# Patient Record
Sex: Male | Born: 1960 | Race: White | Hispanic: No | Marital: Married | Smoking: Former smoker
Health system: Southern US, Community
[De-identification: ages and names within clinical notes are randomized; demographics above are authoritative.]

## PROBLEM LIST (undated history)

## (undated) DIAGNOSIS — M1990 Unspecified osteoarthritis, unspecified site: Secondary | ICD-10-CM

## (undated) DIAGNOSIS — R7302 Impaired glucose tolerance (oral): Secondary | ICD-10-CM

## (undated) DIAGNOSIS — N529 Male erectile dysfunction, unspecified: Secondary | ICD-10-CM

## (undated) DIAGNOSIS — K219 Gastro-esophageal reflux disease without esophagitis: Secondary | ICD-10-CM

## (undated) DIAGNOSIS — G4730 Sleep apnea, unspecified: Secondary | ICD-10-CM

## (undated) DIAGNOSIS — E669 Obesity, unspecified: Secondary | ICD-10-CM

## (undated) DIAGNOSIS — C61 Malignant neoplasm of prostate: Secondary | ICD-10-CM

## (undated) DIAGNOSIS — E119 Type 2 diabetes mellitus without complications: Secondary | ICD-10-CM

## (undated) DIAGNOSIS — F419 Anxiety disorder, unspecified: Secondary | ICD-10-CM

## (undated) DIAGNOSIS — G4733 Obstructive sleep apnea (adult) (pediatric): Secondary | ICD-10-CM

## (undated) DIAGNOSIS — T7840XA Allergy, unspecified, initial encounter: Secondary | ICD-10-CM

## (undated) DIAGNOSIS — I2510 Atherosclerotic heart disease of native coronary artery without angina pectoris: Secondary | ICD-10-CM

## (undated) DIAGNOSIS — I10 Essential (primary) hypertension: Secondary | ICD-10-CM

## (undated) HISTORY — DX: Type 2 diabetes mellitus without complications: E11.9

## (undated) HISTORY — DX: Allergy, unspecified, initial encounter: T78.40XA

## (undated) HISTORY — PX: WRIST SURGERY: SHX841

## (undated) HISTORY — DX: Obstructive sleep apnea (adult) (pediatric): G47.33

## (undated) HISTORY — PX: NASAL SINUS SURGERY: SHX719

## (undated) HISTORY — PX: ELBOW SURGERY: SHX618

## (undated) HISTORY — DX: Obesity, unspecified: E66.9

## (undated) HISTORY — DX: Male erectile dysfunction, unspecified: N52.9

## (undated) HISTORY — PX: KNEE SURGERY: SHX244

## (undated) HISTORY — DX: Atherosclerotic heart disease of native coronary artery without angina pectoris: I25.10

## (undated) HISTORY — PX: SPERMATOCELECTOMY: SHX2420

## (undated) HISTORY — PX: CORONARY STENT PLACEMENT: SHX1402

## (undated) HISTORY — DX: Unspecified osteoarthritis, unspecified site: M19.90

## (undated) HISTORY — DX: Malignant neoplasm of prostate: C61

## (undated) HISTORY — DX: Sleep apnea, unspecified: G47.30

## (undated) HISTORY — DX: Anxiety disorder, unspecified: F41.9

## (undated) HISTORY — DX: Essential (primary) hypertension: I10

## (undated) HISTORY — DX: Impaired glucose tolerance (oral): R73.02

## (undated) HISTORY — DX: Gastro-esophageal reflux disease without esophagitis: K21.9

---

## 2000-06-18 ENCOUNTER — Emergency Department (HOSPITAL_COMMUNITY): Admission: EM | Admit: 2000-06-18 | Discharge: 2000-06-18 | Payer: Self-pay | Admitting: Emergency Medicine

## 2002-11-21 ENCOUNTER — Encounter: Payer: Self-pay | Admitting: Internal Medicine

## 2002-11-21 ENCOUNTER — Encounter: Admission: RE | Admit: 2002-11-21 | Discharge: 2002-11-21 | Payer: Self-pay | Admitting: Internal Medicine

## 2003-08-02 ENCOUNTER — Encounter: Payer: Self-pay | Admitting: *Deleted

## 2003-08-02 ENCOUNTER — Emergency Department (HOSPITAL_COMMUNITY): Admission: EM | Admit: 2003-08-02 | Discharge: 2003-08-02 | Payer: Self-pay | Admitting: Emergency Medicine

## 2005-01-13 ENCOUNTER — Ambulatory Visit: Payer: Self-pay | Admitting: Internal Medicine

## 2005-01-20 ENCOUNTER — Ambulatory Visit: Payer: Self-pay | Admitting: Internal Medicine

## 2005-02-18 ENCOUNTER — Ambulatory Visit: Payer: Self-pay | Admitting: Internal Medicine

## 2005-11-15 ENCOUNTER — Encounter: Admission: RE | Admit: 2005-11-15 | Discharge: 2005-11-15 | Payer: Self-pay | Admitting: Internal Medicine

## 2005-11-15 ENCOUNTER — Ambulatory Visit: Payer: Self-pay | Admitting: Internal Medicine

## 2006-01-18 ENCOUNTER — Ambulatory Visit: Payer: Self-pay | Admitting: Internal Medicine

## 2006-02-01 ENCOUNTER — Ambulatory Visit: Payer: Self-pay | Admitting: Internal Medicine

## 2006-03-17 ENCOUNTER — Ambulatory Visit: Payer: Self-pay | Admitting: Internal Medicine

## 2006-03-18 ENCOUNTER — Ambulatory Visit: Payer: Self-pay | Admitting: Cardiovascular Disease

## 2006-04-07 ENCOUNTER — Ambulatory Visit: Payer: Self-pay | Admitting: Internal Medicine

## 2006-07-28 ENCOUNTER — Ambulatory Visit: Payer: Self-pay | Admitting: Internal Medicine

## 2006-10-24 ENCOUNTER — Ambulatory Visit: Payer: Self-pay | Admitting: Internal Medicine

## 2006-10-31 ENCOUNTER — Ambulatory Visit: Payer: Self-pay | Admitting: Internal Medicine

## 2006-10-31 LAB — CONVERTED CEMR LAB
ALT: 32 units/L (ref 0–40)
AST: 19 units/L (ref 0–37)
Albumin: 4.1 g/dL (ref 3.5–5.2)
Alkaline Phosphatase: 63 units/L (ref 39–117)
BUN: 18 mg/dL (ref 6–23)
Basophils Absolute: 0.1 10*3/uL (ref 0.0–0.1)
Basophils Relative: 1.4 % — ABNORMAL HIGH (ref 0.0–1.0)
Bilirubin Urine: NEGATIVE
CO2: 34 meq/L — ABNORMAL HIGH (ref 19–32)
Calcium: 9.6 mg/dL (ref 8.4–10.5)
Chloride: 101 meq/L (ref 96–112)
Chol/HDL Ratio, serum: 4.3
Cholesterol: 211 mg/dL (ref 0–200)
Creatinine, Ser: 1.5 mg/dL (ref 0.4–1.5)
Eosinophil percent: 2.5 % (ref 0.0–5.0)
GFR calc non Af Amer: 54 mL/min
Glomerular Filtration Rate, Af Am: 65 mL/min/{1.73_m2}
Glucose, Bld: 94 mg/dL (ref 70–99)
HCT: 50.7 % (ref 39.0–52.0)
HDL: 48.9 mg/dL (ref >39.0)
Hemoglobin, Urine: NEGATIVE
Hemoglobin: 16.9 g/dL (ref 13.0–17.0)
Ketones, ur: NEGATIVE mg/dL
LDL DIRECT: 130.6 mg/dL
Leukocytes, UA: NEGATIVE
Lymphocytes Relative: 31.5 % (ref 12.0–46.0)
MCHC: 33.3 g/dL (ref 30.0–36.0)
MCV: 87.3 fL (ref 78.0–100.0)
Monocytes Absolute: 0.7 10*3/uL (ref 0.2–0.7)
Monocytes Relative: 6.3 % (ref 3.0–11.0)
Neutro Abs: 6.2 10*3/uL (ref 1.4–7.7)
Neutrophils Relative %: 58.3 % (ref 43.0–77.0)
Nitrite: NEGATIVE
PSA: 5.57 ng/mL — ABNORMAL HIGH (ref 0.10–4.00)
Platelets: 396 10*3/uL (ref 150–400)
Potassium: 3.9 meq/L (ref 3.5–5.1)
RBC: 5.81 M/uL (ref 4.22–5.81)
RDW: 12.5 % (ref 11.5–14.6)
Sodium: 142 meq/L (ref 135–145)
Specific Gravity, Urine: 1.025 (ref 1.000–1.03)
TSH: 3.19 microintl units/mL (ref 0.35–5.50)
Total Bilirubin: 1 mg/dL (ref 0.3–1.2)
Total Protein, Urine: NEGATIVE mg/dL
Total Protein: 7 g/dL (ref 6.0–8.3)
Triglyceride fasting, serum: 241 mg/dL (ref 0–149)
Urine Glucose: NEGATIVE mg/dL
Urobilinogen, UA: 0.2 (ref 0.0–1.0)
VLDL: 48 mg/dL — ABNORMAL HIGH (ref 0–40)
WBC: 10.6 10*3/uL — ABNORMAL HIGH (ref 4.5–10.5)
pH: 6 (ref 5.0–8.0)

## 2006-12-07 ENCOUNTER — Ambulatory Visit: Payer: Self-pay | Admitting: Internal Medicine

## 2007-01-02 ENCOUNTER — Ambulatory Visit: Payer: Self-pay | Admitting: Internal Medicine

## 2007-02-16 ENCOUNTER — Inpatient Hospital Stay (HOSPITAL_COMMUNITY): Admission: RE | Admit: 2007-02-16 | Discharge: 2007-02-17 | Payer: Self-pay | Admitting: Urology

## 2007-02-16 ENCOUNTER — Encounter (INDEPENDENT_AMBULATORY_CARE_PROVIDER_SITE_OTHER): Payer: Self-pay | Admitting: Specialist

## 2007-09-01 ENCOUNTER — Ambulatory Visit: Payer: Self-pay | Admitting: Internal Medicine

## 2007-09-01 LAB — CONVERTED CEMR LAB
ALT: 53 units/L (ref 0–53)
AST: 30 units/L (ref 0–37)
Albumin: 4.1 g/dL (ref 3.5–5.2)
Alkaline Phosphatase: 62 units/L (ref 39–117)
BUN: 13 mg/dL (ref 6–23)
Basophils Absolute: 0.1 10*3/uL (ref 0.0–0.1)
Basophils Relative: 1.2 % — ABNORMAL HIGH (ref 0.0–1.0)
Bilirubin Urine: NEGATIVE
Bilirubin, Direct: 0.1 mg/dL (ref 0.0–0.3)
CO2: 32 meq/L (ref 19–32)
Calcium: 9.5 mg/dL (ref 8.4–10.5)
Chloride: 109 meq/L (ref 96–112)
Cholesterol: 197 mg/dL (ref 0–200)
Creatinine, Ser: 1.3 mg/dL (ref 0.4–1.5)
Direct LDL: 107.4 mg/dL
Eosinophils Absolute: 0.4 10*3/uL (ref 0.0–0.6)
Eosinophils Relative: 5.9 % — ABNORMAL HIGH (ref 0.0–5.0)
GFR calc Af Amer: 76 mL/min
GFR calc non Af Amer: 63 mL/min
Glucose, Bld: 113 mg/dL — ABNORMAL HIGH (ref 70–99)
HCT: 45.2 % (ref 39.0–52.0)
HDL: 38.7 mg/dL — ABNORMAL LOW (ref >39.0)
Hemoglobin, Urine: NEGATIVE
Hemoglobin: 15.9 g/dL (ref 13.0–17.0)
Ketones, ur: NEGATIVE mg/dL
Leukocytes, UA: NEGATIVE
Lymphocytes Relative: 21.4 % (ref 12.0–46.0)
MCHC: 35.2 g/dL (ref 30.0–36.0)
MCV: 86.4 fL (ref 78.0–100.0)
Monocytes Absolute: 0.5 10*3/uL (ref 0.2–0.7)
Monocytes Relative: 7.7 % (ref 3.0–11.0)
Neutro Abs: 4.5 10*3/uL (ref 1.4–7.7)
Neutrophils Relative %: 63.8 % (ref 43.0–77.0)
Nitrite: NEGATIVE
PSA: 0 ng/mL — ABNORMAL LOW (ref 0.10–4.00)
Platelets: 308 10*3/uL (ref 150–400)
Potassium: 3.8 meq/L (ref 3.5–5.1)
RBC: 5.24 M/uL (ref 4.22–5.81)
RDW: 12.5 % (ref 11.5–14.6)
Sodium: 146 meq/L — ABNORMAL HIGH (ref 135–145)
Specific Gravity, Urine: 1.025 (ref 1.000–1.03)
TSH: 2.71 microintl units/mL (ref 0.35–5.50)
Total Bilirubin: 0.7 mg/dL (ref 0.3–1.2)
Total CHOL/HDL Ratio: 5.1
Total Protein, Urine: NEGATIVE mg/dL
Total Protein: 6.6 g/dL (ref 6.0–8.3)
Triglycerides: 362 mg/dL (ref 0–149)
Urine Glucose: NEGATIVE mg/dL
Urobilinogen, UA: 0.2 (ref 0.0–1.0)
VLDL: 72 mg/dL — ABNORMAL HIGH (ref 0–40)
WBC: 7 10*3/uL (ref 4.5–10.5)
pH: 6 (ref 5.0–8.0)

## 2007-09-04 ENCOUNTER — Ambulatory Visit: Payer: Self-pay | Admitting: Internal Medicine

## 2007-09-05 ENCOUNTER — Encounter: Payer: Self-pay | Admitting: Internal Medicine

## 2007-09-05 DIAGNOSIS — J45909 Unspecified asthma, uncomplicated: Secondary | ICD-10-CM | POA: Insufficient documentation

## 2007-09-05 DIAGNOSIS — Z8546 Personal history of malignant neoplasm of prostate: Secondary | ICD-10-CM | POA: Insufficient documentation

## 2007-09-05 DIAGNOSIS — E739 Lactose intolerance, unspecified: Secondary | ICD-10-CM

## 2007-09-05 DIAGNOSIS — K219 Gastro-esophageal reflux disease without esophagitis: Secondary | ICD-10-CM | POA: Insufficient documentation

## 2007-09-05 DIAGNOSIS — R030 Elevated blood-pressure reading, without diagnosis of hypertension: Secondary | ICD-10-CM | POA: Insufficient documentation

## 2007-10-19 ENCOUNTER — Encounter: Payer: Self-pay | Admitting: Internal Medicine

## 2008-01-19 ENCOUNTER — Encounter: Payer: Self-pay | Admitting: Internal Medicine

## 2008-08-13 ENCOUNTER — Encounter: Payer: Self-pay | Admitting: Internal Medicine

## 2008-09-11 ENCOUNTER — Encounter: Payer: Self-pay | Admitting: Internal Medicine

## 2008-10-10 ENCOUNTER — Ambulatory Visit: Payer: Self-pay | Admitting: Internal Medicine

## 2008-10-10 LAB — CONVERTED CEMR LAB
ALT: 41 units/L (ref 0–53)
AST: 28 units/L (ref 0–37)
Albumin: 4.1 g/dL (ref 3.5–5.2)
Alkaline Phosphatase: 57 units/L (ref 39–117)
BUN: 20 mg/dL (ref 6–23)
Basophils Absolute: 0 10*3/uL (ref 0.0–0.1)
Basophils Relative: 0.3 % (ref 0.0–3.0)
Bilirubin Urine: NEGATIVE
Bilirubin, Direct: 0.2 mg/dL (ref 0.0–0.3)
CO2: 32 meq/L (ref 19–32)
Calcium: 9.5 mg/dL (ref 8.4–10.5)
Chloride: 98 meq/L (ref 96–112)
Cholesterol: 182 mg/dL (ref 0–200)
Creatinine, Ser: 1.4 mg/dL (ref 0.4–1.5)
Eosinophils Absolute: 0.3 10*3/uL (ref 0.0–0.7)
Eosinophils Relative: 4.9 % (ref 0.0–5.0)
GFR calc Af Amer: 70 mL/min
GFR calc non Af Amer: 58 mL/min
Glucose, Bld: 101 mg/dL — ABNORMAL HIGH (ref 70–99)
HCT: 50.3 % (ref 39.0–52.0)
HDL: 39.5 mg/dL (ref >39.0)
Hemoglobin, Urine: NEGATIVE
Hemoglobin: 17.2 g/dL — ABNORMAL HIGH (ref 13.0–17.0)
Ketones, ur: NEGATIVE mg/dL
LDL Cholesterol: 115 mg/dL — ABNORMAL HIGH (ref 0–99)
Leukocytes, UA: NEGATIVE
Lymphocytes Relative: 20.6 % (ref 12.0–46.0)
MCHC: 34.1 g/dL (ref 30.0–36.0)
MCV: 87.5 fL (ref 78.0–100.0)
Monocytes Absolute: 0.6 10*3/uL (ref 0.1–1.0)
Monocytes Relative: 10.7 % (ref 3.0–12.0)
Neutro Abs: 3.8 10*3/uL (ref 1.4–7.7)
Neutrophils Relative %: 63.5 % (ref 43.0–77.0)
Nitrite: NEGATIVE
PSA: 0 ng/mL — ABNORMAL LOW (ref 0.10–4.00)
Platelets: 261 10*3/uL (ref 150–400)
Potassium: 4.2 meq/L (ref 3.5–5.1)
RBC: 5.74 M/uL (ref 4.22–5.81)
RDW: 12.5 % (ref 11.5–14.6)
Sodium: 138 meq/L (ref 135–145)
Specific Gravity, Urine: 1.02 (ref 1.000–1.03)
TSH: 3.41 microintl units/mL (ref 0.35–5.50)
Total Bilirubin: 1 mg/dL (ref 0.3–1.2)
Total CHOL/HDL Ratio: 4.6
Total Protein, Urine: NEGATIVE mg/dL
Total Protein: 7.3 g/dL (ref 6.0–8.3)
Triglycerides: 136 mg/dL (ref 0–149)
Urine Glucose: NEGATIVE mg/dL
Urobilinogen, UA: 0.2 (ref 0.0–1.0)
VLDL: 27 mg/dL (ref 0–40)
WBC: 5.9 10*3/uL (ref 4.5–10.5)
pH: 6 (ref 5.0–8.0)

## 2008-10-15 ENCOUNTER — Ambulatory Visit: Payer: Self-pay | Admitting: Internal Medicine

## 2008-10-15 DIAGNOSIS — F411 Generalized anxiety disorder: Secondary | ICD-10-CM | POA: Insufficient documentation

## 2008-10-15 DIAGNOSIS — J309 Allergic rhinitis, unspecified: Secondary | ICD-10-CM | POA: Insufficient documentation

## 2008-10-16 ENCOUNTER — Encounter: Payer: Self-pay | Admitting: Internal Medicine

## 2008-12-03 ENCOUNTER — Encounter: Payer: Self-pay | Admitting: Internal Medicine

## 2008-12-16 ENCOUNTER — Ambulatory Visit: Payer: Self-pay | Admitting: Internal Medicine

## 2008-12-16 DIAGNOSIS — R1084 Generalized abdominal pain: Secondary | ICD-10-CM | POA: Insufficient documentation

## 2008-12-16 DIAGNOSIS — R109 Unspecified abdominal pain: Secondary | ICD-10-CM | POA: Insufficient documentation

## 2008-12-16 LAB — CONVERTED CEMR LAB
BUN: 15 mg/dL (ref 6–23)
CO2: 31 meq/L (ref 19–32)
Calcium: 8.9 mg/dL (ref 8.4–10.5)
Chloride: 105 meq/L (ref 96–112)
Creatinine, Ser: 1.2 mg/dL (ref 0.4–1.5)
GFR calc Af Amer: 83 mL/min
GFR calc non Af Amer: 69 mL/min
Glucose, Bld: 98 mg/dL (ref 70–99)
H Pylori IgG: NEGATIVE
Potassium: 4.1 meq/L (ref 3.5–5.1)
Sodium: 141 meq/L (ref 135–145)

## 2008-12-18 ENCOUNTER — Ambulatory Visit: Payer: Self-pay | Admitting: Internal Medicine

## 2008-12-24 ENCOUNTER — Encounter: Payer: Self-pay | Admitting: Internal Medicine

## 2009-01-03 ENCOUNTER — Ambulatory Visit (HOSPITAL_BASED_OUTPATIENT_CLINIC_OR_DEPARTMENT_OTHER): Admission: RE | Admit: 2009-01-03 | Discharge: 2009-01-03 | Payer: Self-pay | Admitting: Orthopedic Surgery

## 2009-01-16 ENCOUNTER — Encounter: Payer: Self-pay | Admitting: Internal Medicine

## 2009-01-20 ENCOUNTER — Encounter: Payer: Self-pay | Admitting: Internal Medicine

## 2009-02-11 ENCOUNTER — Encounter: Payer: Self-pay | Admitting: Internal Medicine

## 2009-03-13 ENCOUNTER — Encounter: Payer: Self-pay | Admitting: Internal Medicine

## 2009-04-10 ENCOUNTER — Encounter: Payer: Self-pay | Admitting: Internal Medicine

## 2009-05-22 ENCOUNTER — Encounter: Payer: Self-pay | Admitting: Internal Medicine

## 2009-08-01 ENCOUNTER — Encounter: Payer: Self-pay | Admitting: Internal Medicine

## 2009-10-13 ENCOUNTER — Ambulatory Visit: Payer: Self-pay | Admitting: Internal Medicine

## 2009-10-13 LAB — CONVERTED CEMR LAB
ALT: 49 units/L (ref 0–53)
AST: 32 units/L (ref 0–37)
Albumin: 4.3 g/dL (ref 3.5–5.2)
Alkaline Phosphatase: 69 units/L (ref 39–117)
BUN: 15 mg/dL (ref 6–23)
Basophils Absolute: 0 10*3/uL (ref 0.0–0.1)
Basophils Relative: 0.8 % (ref 0.0–3.0)
Bilirubin Urine: NEGATIVE
Bilirubin, Direct: 0.1 mg/dL (ref 0.0–0.3)
CO2: 31 meq/L (ref 19–32)
Calcium: 9.6 mg/dL (ref 8.4–10.5)
Chloride: 106 meq/L (ref 96–112)
Cholesterol: 172 mg/dL (ref 0–200)
Creatinine, Ser: 1.3 mg/dL (ref 0.4–1.5)
Eosinophils Absolute: 0.5 10*3/uL (ref 0.0–0.7)
Eosinophils Relative: 8.7 % — ABNORMAL HIGH (ref 0.0–5.0)
GFR calc non Af Amer: 62.47 mL/min (ref >60)
Glucose, Bld: 101 mg/dL — ABNORMAL HIGH (ref 70–99)
HCT: 48.5 % (ref 39.0–52.0)
HDL: 42.1 mg/dL (ref >39.00)
Hemoglobin, Urine: NEGATIVE
Hemoglobin: 16.6 g/dL (ref 13.0–17.0)
Ketones, ur: NEGATIVE mg/dL
LDL Cholesterol: 101 mg/dL — ABNORMAL HIGH (ref 0–99)
Leukocytes, UA: NEGATIVE
Lymphocytes Relative: 28.3 % (ref 12.0–46.0)
Lymphs Abs: 1.6 10*3/uL (ref 0.7–4.0)
MCHC: 34.2 g/dL (ref 30.0–36.0)
MCV: 90.4 fL (ref 78.0–100.0)
Monocytes Absolute: 0.4 10*3/uL (ref 0.1–1.0)
Monocytes Relative: 6.6 % (ref 3.0–12.0)
Neutro Abs: 3.2 10*3/uL (ref 1.4–7.7)
Neutrophils Relative %: 55.6 % (ref 43.0–77.0)
Nitrite: NEGATIVE
PSA: 0.01 ng/mL — ABNORMAL LOW (ref 0.10–4.00)
Platelets: 312 10*3/uL (ref 150.0–400.0)
Potassium: 4.4 meq/L (ref 3.5–5.1)
RBC: 5.37 M/uL (ref 4.22–5.81)
RDW: 12.1 % (ref 11.5–14.6)
Sodium: 145 meq/L (ref 135–145)
Specific Gravity, Urine: 1.025 (ref 1.000–1.030)
TSH: 2.61 microintl units/mL (ref 0.35–5.50)
Total Bilirubin: 0.8 mg/dL (ref 0.3–1.2)
Total CHOL/HDL Ratio: 4
Total Protein, Urine: NEGATIVE mg/dL
Total Protein: 6.7 g/dL (ref 6.0–8.3)
Triglycerides: 144 mg/dL (ref 0.0–149.0)
Urine Glucose: NEGATIVE mg/dL
Urobilinogen, UA: 0.2 (ref 0.0–1.0)
VLDL: 28.8 mg/dL (ref 0.0–40.0)
WBC: 5.7 10*3/uL (ref 4.5–10.5)
pH: 5.5 (ref 5.0–8.0)

## 2009-10-17 ENCOUNTER — Ambulatory Visit: Payer: Self-pay | Admitting: Internal Medicine

## 2009-11-21 ENCOUNTER — Ambulatory Visit: Payer: Self-pay | Admitting: Internal Medicine

## 2009-11-21 DIAGNOSIS — M549 Dorsalgia, unspecified: Secondary | ICD-10-CM | POA: Insufficient documentation

## 2009-12-20 DIAGNOSIS — I219 Acute myocardial infarction, unspecified: Secondary | ICD-10-CM

## 2009-12-20 HISTORY — DX: Acute myocardial infarction, unspecified: I21.9

## 2009-12-20 HISTORY — PX: CORONARY STENT PLACEMENT: SHX1402

## 2010-01-10 ENCOUNTER — Emergency Department (HOSPITAL_COMMUNITY): Admission: EM | Admit: 2010-01-10 | Discharge: 2010-01-10 | Payer: Self-pay | Admitting: Emergency Medicine

## 2010-02-06 ENCOUNTER — Encounter: Payer: Self-pay | Admitting: Internal Medicine

## 2010-03-13 ENCOUNTER — Ambulatory Visit: Payer: Self-pay | Admitting: Internal Medicine

## 2010-03-13 DIAGNOSIS — F459 Somatoform disorder, unspecified: Secondary | ICD-10-CM | POA: Insufficient documentation

## 2010-03-13 DIAGNOSIS — R42 Dizziness and giddiness: Secondary | ICD-10-CM | POA: Insufficient documentation

## 2010-03-16 ENCOUNTER — Telehealth: Payer: Self-pay | Admitting: Internal Medicine

## 2010-03-20 ENCOUNTER — Ambulatory Visit (HOSPITAL_COMMUNITY)
Admission: RE | Admit: 2010-03-20 | Discharge: 2010-03-20 | Payer: Self-pay | Source: Home / Self Care | Admitting: Internal Medicine

## 2010-03-23 ENCOUNTER — Encounter: Payer: Self-pay | Admitting: Internal Medicine

## 2010-03-24 ENCOUNTER — Telehealth: Payer: Self-pay | Admitting: Internal Medicine

## 2010-03-31 ENCOUNTER — Encounter (INDEPENDENT_AMBULATORY_CARE_PROVIDER_SITE_OTHER): Payer: Self-pay | Admitting: *Deleted

## 2010-04-21 ENCOUNTER — Encounter: Payer: Self-pay | Admitting: Internal Medicine

## 2010-09-17 ENCOUNTER — Ambulatory Visit: Payer: Self-pay | Admitting: Internal Medicine

## 2010-09-17 LAB — CONVERTED CEMR LAB
ALT: 25 units/L (ref 0–53)
AST: 23 units/L (ref 0–37)
Albumin: 4.5 g/dL (ref 3.5–5.2)
Alkaline Phosphatase: 67 units/L (ref 39–117)
BUN: 16 mg/dL (ref 6–23)
Basophils Absolute: 0.1 10*3/uL (ref 0.0–0.1)
Basophils Relative: 0.9 % (ref 0.0–3.0)
Bilirubin Urine: NEGATIVE
Bilirubin, Direct: 0.1 mg/dL (ref 0.0–0.3)
CO2: 32 meq/L (ref 19–32)
Calcium: 10.2 mg/dL (ref 8.4–10.5)
Chloride: 106 meq/L (ref 96–112)
Cholesterol: 178 mg/dL (ref 0–200)
Creatinine, Ser: 1.3 mg/dL (ref 0.4–1.5)
Eosinophils Absolute: 0.7 10*3/uL (ref 0.0–0.7)
Eosinophils Relative: 10.7 % — ABNORMAL HIGH (ref 0.0–5.0)
GFR calc non Af Amer: 62.78 mL/min (ref >60)
Glucose, Bld: 98 mg/dL (ref 70–99)
HCT: 48.5 % (ref 39.0–52.0)
HDL: 44.9 mg/dL (ref >39.00)
Hemoglobin, Urine: NEGATIVE
Hemoglobin: 16.8 g/dL (ref 13.0–17.0)
Ketones, ur: NEGATIVE mg/dL
LDL Cholesterol: 104 mg/dL — ABNORMAL HIGH (ref 0–99)
Leukocytes, UA: NEGATIVE
Lymphocytes Relative: 28.2 % (ref 12.0–46.0)
Lymphs Abs: 1.7 10*3/uL (ref 0.7–4.0)
MCHC: 34.6 g/dL (ref 30.0–36.0)
MCV: 88.5 fL (ref 78.0–100.0)
Monocytes Absolute: 0.4 10*3/uL (ref 0.1–1.0)
Monocytes Relative: 7.1 % (ref 3.0–12.0)
Neutro Abs: 3.3 10*3/uL (ref 1.4–7.7)
Neutrophils Relative %: 53.1 % (ref 43.0–77.0)
Nitrite: NEGATIVE
PSA: 0.01 ng/mL — ABNORMAL LOW (ref 0.10–4.00)
Platelets: 305 10*3/uL (ref 150.0–400.0)
Potassium: 5.7 meq/L — ABNORMAL HIGH (ref 3.5–5.1)
RBC: 5.48 M/uL (ref 4.22–5.81)
RDW: 13.2 % (ref 11.5–14.6)
Sodium: 143 meq/L (ref 135–145)
Specific Gravity, Urine: 1.025 (ref 1.000–1.030)
TSH: 2.69 microintl units/mL (ref 0.35–5.50)
Total Bilirubin: 0.7 mg/dL (ref 0.3–1.2)
Total CHOL/HDL Ratio: 4
Total Protein, Urine: NEGATIVE mg/dL
Total Protein: 7 g/dL (ref 6.0–8.3)
Triglycerides: 144 mg/dL (ref 0.0–149.0)
Urine Glucose: NEGATIVE mg/dL
Urobilinogen, UA: 0.2 (ref 0.0–1.0)
VLDL: 28.8 mg/dL (ref 0.0–40.0)
WBC: 6.2 10*3/uL (ref 4.5–10.5)
pH: 6 (ref 5.0–8.0)

## 2010-09-18 ENCOUNTER — Encounter: Payer: Self-pay | Admitting: Internal Medicine

## 2010-09-18 ENCOUNTER — Ambulatory Visit: Payer: Self-pay | Admitting: Internal Medicine

## 2010-12-04 ENCOUNTER — Inpatient Hospital Stay (HOSPITAL_COMMUNITY)
Admission: RE | Admit: 2010-12-04 | Discharge: 2010-12-07 | Payer: Self-pay | Source: Home / Self Care | Attending: Cardiovascular Disease | Admitting: Cardiovascular Disease

## 2010-12-04 ENCOUNTER — Encounter: Payer: Self-pay | Admitting: Physician Assistant

## 2010-12-06 ENCOUNTER — Encounter: Payer: Self-pay | Admitting: Cardiology

## 2010-12-09 ENCOUNTER — Telehealth: Payer: Self-pay | Admitting: Cardiovascular Disease

## 2010-12-10 ENCOUNTER — Telehealth (INDEPENDENT_AMBULATORY_CARE_PROVIDER_SITE_OTHER): Payer: Self-pay | Admitting: *Deleted

## 2010-12-17 ENCOUNTER — Encounter: Payer: Self-pay | Admitting: Physician Assistant

## 2010-12-22 ENCOUNTER — Inpatient Hospital Stay (HOSPITAL_COMMUNITY)
Admission: EM | Admit: 2010-12-22 | Discharge: 2010-12-23 | Payer: Self-pay | Source: Home / Self Care | Attending: Cardiology | Admitting: Cardiology

## 2010-12-22 ENCOUNTER — Ambulatory Visit
Admission: RE | Admit: 2010-12-22 | Discharge: 2010-12-22 | Payer: Self-pay | Source: Home / Self Care | Attending: Physician Assistant | Admitting: Physician Assistant

## 2010-12-22 ENCOUNTER — Encounter: Payer: Self-pay | Admitting: Physician Assistant

## 2010-12-22 ENCOUNTER — Encounter (INDEPENDENT_AMBULATORY_CARE_PROVIDER_SITE_OTHER): Payer: Self-pay | Admitting: *Deleted

## 2010-12-22 ENCOUNTER — Encounter: Payer: Self-pay | Admitting: Cardiovascular Disease

## 2010-12-22 DIAGNOSIS — I2510 Atherosclerotic heart disease of native coronary artery without angina pectoris: Secondary | ICD-10-CM | POA: Insufficient documentation

## 2010-12-22 DIAGNOSIS — R079 Chest pain, unspecified: Secondary | ICD-10-CM | POA: Insufficient documentation

## 2010-12-22 DIAGNOSIS — I2119 ST elevation (STEMI) myocardial infarction involving other coronary artery of inferior wall: Secondary | ICD-10-CM | POA: Insufficient documentation

## 2010-12-22 DIAGNOSIS — E785 Hyperlipidemia, unspecified: Secondary | ICD-10-CM | POA: Insufficient documentation

## 2010-12-23 LAB — CARDIAC PANEL(CRET KIN+CKTOT+MB+TROPI)
CK, MB: 1 ng/mL (ref 0.3–4.0)
Relative Index: INVALID (ref 0.0–2.5)
Total CK: 56 U/L (ref 7–232)
Troponin I: 0.02 ng/mL (ref 0.00–0.06)

## 2010-12-24 ENCOUNTER — Encounter: Payer: Self-pay | Admitting: Cardiovascular Disease

## 2011-01-01 ENCOUNTER — Telehealth: Payer: Self-pay | Admitting: Cardiovascular Disease

## 2011-01-13 ENCOUNTER — Telehealth: Payer: Self-pay | Admitting: Cardiovascular Disease

## 2011-01-15 ENCOUNTER — Encounter: Payer: Self-pay | Admitting: Cardiovascular Disease

## 2011-01-15 ENCOUNTER — Ambulatory Visit
Admission: RE | Admit: 2011-01-15 | Discharge: 2011-01-15 | Payer: Self-pay | Source: Home / Self Care | Attending: Cardiovascular Disease | Admitting: Cardiovascular Disease

## 2011-01-19 ENCOUNTER — Ambulatory Visit
Admission: RE | Admit: 2011-01-19 | Discharge: 2011-01-19 | Payer: Self-pay | Source: Home / Self Care | Attending: Internal Medicine | Admitting: Internal Medicine

## 2011-01-19 DIAGNOSIS — G4733 Obstructive sleep apnea (adult) (pediatric): Secondary | ICD-10-CM | POA: Insufficient documentation

## 2011-01-19 NOTE — Discharge Summary (Signed)
NAMEFRED, FRANZEN                  ACCOUNT NO.:  [id number]  MEDICAL RECORD NO.:  [id number]          PATIENT TYPE:  INP  LOCATION:  4705                         FACILITY:  [facility]  PHYSICIAN:  Veverly Fells. Excell Seltzer, MD  DATE OF BIRTH:  July 01, 1961  DATE OF ADMISSION:  12/22/2010 DATE OF DISCHARGE:  12/23/2010                              DISCHARGE SUMMARY   PRIMARY CARDIOLOGIST:  Veverly Fells. Excell Seltzer, MD  PRIMARY CARE PROVIDER:  Corwin Levins, MD  DISCHARGE DIAGNOSIS:  Chest pain without objective evidence of ischemia.  SECONDARY DIAGNOSES: 1. Coronary artery disease, status post prior inferior ST-elevation     myocardial infarction with bare-metal stenting x2 of the right     coronary artery, December 2011. 2. History of mild hypotension in the setting of myocardial     infarction. 3. Remote tobacco abuse, quitting 20 years ago. 4. History of prostate cancer, status post prostatectomy in 2008. 5. Gastroesophageal reflux disease. 6. Erectile dysfunction. 7. Osteoarthritis. 8. Anxiety.  ALLERGIES:  No known drug allergies.  PROCEDURES:  None.  HISTORY OF PRESENT ILLNESS:  A 50 year old male status post recent inferior ST-elevation MI on December 04, 2010, with bare-metal stenting of the right coronary artery.  The patient did have recurrent pain and ST-segment elevation immediately following this procedure with acute stent thrombosis, requiring additional bare-metal stent.  The patient followed up in office on December 22, 2010 and reported mild persistent chest discomfort dissimilar to prior angina.  He also reported occasional arm pain.  While in the office, he became acutely lightheaded and left arm pain worsened.  ECG was performed showed no acute changes. Decision was made to observe the patient in the hospital overnight.  HOSPITAL COURSE:  The patient ruled out for MI.  This morning, he denies chest discomfort, but does report occasional palpitations.  We have been able to  initiate low-dosed beta-blocker therapy, which we were unable to do when he was here for his MI secondary to hypotension.  After long discussion with the patient and wife, it was felt that perhaps post MI anxiety is driving symptoms at this point.  The patient has been reassured and will be discharged home today in good condition.  DISCHARGE LABS:  Hemoglobin 15.0, hematocrit 44.0, WBC 7.8, platelets 373.  INR 1.0.  Sodium 141, potassium 3.7, chloride 102, CO2 29, BUN 6, creatinine 1.1, glucose 102, total bilirubin 0.5, alkaline phosphatase 86, AST 30, ALT 33, total protein 6.7, albumin 4.1, calcium 9.3, CK 56, MB 1.0, troponin-I 0.02.  DISPOSITION:  The patient will be discharged home today in good condition.  FOLLOWUP PLANS AND APPOINTMENTS:  We have arranged for the patient to follow up with Dr. Excell Seltzer on January 22, 2011 at 11 a.m., follow up with Dr. Jonny Ruiz as scheduled.  The patient plans to enroll in cardiac rehab in [place].  DISCHARGE MEDICATIONS: 1. Pepcid 20 mg b.i.d. 2. Toprol-XL 25 mg half a tablet daily. 3. Aspirin 81 mg daily. 4. Alprazolam 0.5 mg t.i.d. p.r.n. 5. Lipitor 80 mg nightly. 6. Nitroglycerin 0.4 mg sublingual p.r.n. chest pain. 7. Prasugrel 10 mg daily. 8. Ultram  50 mg q.6 h. p.r.n.  OUTSTANDING LABORATORY STUDIES:  The patient will require a followup lipids and LFTs in approximately 4 weeks as statin was new at the time of his MI.  DURATION OF DISCHARGE ENCOUNTER:  Thirty five minutes including physician time.     Nicolasa Ducking, ANP   ______________________________ Veverly Fells. Excell Seltzer, MD    CB/MEDQ  D:  12/23/2010  T:  12/24/2010  Job:  [id number]  cc:   Corwin Levins, MD  Electronically Signed by Nicolasa Ducking ANP on 01/11/2011 03:43:20 PM Electronically Signed by Tonny Bollman MD on 01/19/2011 04:46:41 AM

## 2011-01-21 NOTE — Letter (Signed)
Summary: [facility] Consult Scheduled Letter  [facility]  [address] [place], [place] [address]   Phone: [phone]  Fax: [phone]      03/31/2010 MRN: [id number]  [facility] [address] [place], [place]  [address]    Dear Mr. Andrus,      We have scheduled an appointment for you.  At the recommendation of Dr.John, we have scheduled you a consult with Dr Terrace Arabia on 04/21/10 at 10:15am.  Their phone number is [phone].  If this appointment day and time is not convenient for you, please feel free to call the office of the doctor you are being referred to at the number listed above and reschedule the appointment.    [facility] [address] [place], [place] [address]   *Please arrive 30 minutes prior to appointment time.*   Thank you,  Patient Care Coordinator  [facility]

## 2011-01-21 NOTE — Progress Notes (Signed)
   FMLA papers dropped off,sent to [facility]  December 10, 2010 2:57 PM

## 2011-01-21 NOTE — Progress Notes (Signed)
Summary: MRI?  Phone Note Call from Patient   Caller: Patient [phone] Summary of Call: pt called wanting to know if an ABX is needed to treat chronic sinusitis and if he would need to be referred to Neurologist for Atrophy per MRI. please advise Initial call taken by: Margaret Pyle, CMA,  March 24, 2010 9:29 AM  Follow-up for Phone Call        he is already on the tx for chronic sinusitis with the allegra, although we could refer to allergy if he wants;    there is no specific reason to see neurology for the atrophy;  I would consider psychiatry referral though based on his symptoms adn we could refer if he wants to do this Follow-up by: Corwin Levins MD,  March 24, 2010 12:48 PM  Additional Follow-up for Phone Call Additional follow up Details #1::        Pt's spouse informed and would like for pt to have referral to Psych. Pt's spouse informed to expect call from [facility]. Additional Follow-up by: Margaret Pyle, CMA,  March 24, 2010 2:39 PM    Additional Follow-up for Phone Call Additional follow up Details #2::    ok for referral, done per EMR Follow-up by: Corwin Levins MD,  March 24, 2010 3:17 PM

## 2011-01-21 NOTE — Letter (Signed)
Summary: [facility]  [facility]   Imported By: Sherian Rein 02/13/2010 10:07:15  _____________________________________________________________________  External Attachment:    Type:   Image     Comment:   External Document

## 2011-01-21 NOTE — Assessment & Plan Note (Signed)
Summary: look at rash/? med related  Medications Added BRILINTA 90 MG TABS (TICAGRELOR) take one tablet by mouth two times a day ASPIRIN 81 MG TBEC (ASPIRIN) Take one tablet by mouth daily NITROSTAT 0.4 MG SUBL (NITROGLYCERIN) 1 tablet under tongue at onset of chest pain; you may repeat every 5 minutes for up to 3 doses.        Visit Type:  Follow-up Primary Provider:  Corwin Levins MD  CC:  Rash.  History of Present Illness: [name] Gross is a 50 yo male who was admitted to [facility] on 12/04/2010 with an acute inferior STEMI.  He was treated with a BMS to the RCA which was occluded in the mid vessel.  Post intervention he developed recurrent chest pain with ST elevation.  He underwent emergent relook cath which demonstrated acute stent thrombosis and he was treated with a second BMS with adjunctive thrombectomy.  EF was low at 45% with inferior dyskinesis on cath.  Echo 12/06/2010 demonstrated an EF 55-65%; ? inferior HK and mild MR.   The patient has developed a rash on his arms and legs associated with intense itching. He has noted this over the past 3 weeks. No involvement of the trunk and denies any unusual skin exposures.  He is having problems with anxiety and panic attacks. He notes situational tachycardia. At times he has fleeting chest pain and he them will check his HR and it will be elevated in the range of 120-130 bpm.  No exertional chest pain or pressure and he is doing fine with cardiac rehab. Returns to work next week.  Current Medications (verified): 1)  Pepcid 20 Mg Tabs (Famotidine) .... Take One Tab By Mouth Two Times A Day As Needed 2)  Cialis 20 Mg Tabs (Tadalafil) 3)  Alprazolam 0.5 Mg Tabs (Alprazolam) .... 1/2 By Mouth Two Times A Day As Needed 4)  Effient 10 Mg Tabs (Prasugrel Hcl) .... Take One Tablet By Mouth Daily 5)  Aspirin Ec 325 Mg Tbec (Aspirin) .... Take One Tablet By Mouth Daily 6)  Lipitor 80 Mg Tabs (Atorvastatin Calcium) .... Take One Tablet By  Mouth Daily. 7)  Tramadol Hcl 50 Mg Tabs (Tramadol Hcl) .... Take 1 Tablet Every 6 Hours As Needed 8)  Metoprolol Succinate 25 Mg Xr24h-Tab (Metoprolol Succinate) .... Take 1/2  Tablet By Mouth Daily 9)  Aspirin 81 Mg Tbec (Aspirin) .... Take One Tablet By Mouth Daily 10)  Nitrostat 0.4 Mg Subl (Nitroglycerin) .Marland Kitchen.. 1 Tablet Under Tongue At Onset of Chest Pain; You May Repeat Every 5 Minutes For Up To 3 Doses.  Allergies: 1)  ! Amoxicillin  Past History:  Past medical history reviewed for relevance to current acute and chronic problems.  Past Medical History: Reviewed history from 12/22/2010 and no changes required. CAD   a. s/p INF STEMI 12/04/2010: treated with BMS to RCA c/b acute stent thrombosis at completion of primary PCI    b. above tx with second BMS with adjunctive thrombectomy   c. residual CAD at cath 12/04/2010: pRCA 40-50%; LAD and CFX ok; EF 45% Echo 12/06/2010: Ef 55-65%; ?HK inferior wall; mild MR Glucose Intolerance Asthma - mild intermittent GERD Hypertension Obesity Prostate cancer, hx of Anxiety Allergic rhinitis E.D.  Review of Systems       Negative except as per HPI   Vital Signs:  Patient profile:   50 year old male Height:      64 inches Weight:      150.50 pounds  BMI:     25.93 Pulse rate:   112 / minute Pulse rhythm:   irregular Resp:     18 per minute BP sitting:   137 / 91  (left arm) Cuff size:   large  Vitals Entered By: Vikki Ports (January 15, 2011 10:15 AM)  Physical Exam  General:  Pt is alert and oriented, in no acute distress. HEENT: normal Neck: normal carotid upstrokes without bruits, JVP normal Lungs: CTA CV: RRR without murmur or gallop Abd: soft, NT, positive BS, no bruit, no organomegaly Ext: no clubbing, cyanosis, or edema. peripheral pulses 2+ and equal Skin: maculopapular rask distal legs and bilateral forearms with excoriations.    EKG  Procedure date:  01/15/2011  Findings:      Sinus tachycardia 111  bpm, age-indeterminate inferior MI.  Impression & Recommendations:  Problem # 1:  CORONARY ATHEROSCLEROSIS NATIVE CORONARY ARTERY (ICD-414.01) Pt is stable wihtout exertional angina. Will change prasugrel (Effient) to ticagrelor (Brilinta) as a therapeutic trial to see if his rash is caused by Effient. He will stop Effient today and start Brilinta 90 bg two times a day tomorrow.  We discussed his issues with anxiety which are presenting a significant problem for him. I advised to take a full Xanax 0.5 mg tab instead of 1/2 tab when he has a panic attack as he is not having good symptom relief from the 1/2 tab. Also advised to review with Dr Jonny Ruiz to see if an SSRI or other med might be indicated as preventative Rx.  His updated medication list for this problem includes:    Brilinta 90 Mg Tabs (Ticagrelor) .Marland Kitchen... Take one tablet by mouth two times a day    Aspirin Ec 325 Mg Tbec (Aspirin) .Marland Kitchen... Take one tablet by mouth daily    Metoprolol Succinate 25 Mg Xr24h-tab (Metoprolol succinate) .Marland Kitchen... Take 1/2  tablet by mouth daily    Aspirin 81 Mg Tbec (Aspirin) .Marland Kitchen... Take one tablet by mouth daily    Nitrostat 0.4 Mg Subl (Nitroglycerin) .Marland Kitchen... 1 tablet under tongue at onset of chest pain; you may repeat every 5 minutes for up to 3 doses.  Problem # 2:  DYSLIPIDEMIA (ICD-272.4) Check a lipid and liver panel next month. Goal LDL less than 70 mg/dL.  His updated medication list for this problem includes:    Lipitor 80 Mg Tabs (Atorvastatin calcium) .Marland Kitchen... Take one tablet by mouth daily.  Orders: EKG w/ Interpretation (93000)  Patient Instructions: 1)  Your physician recommends that you schedule a follow-up appointment in: 2 MONTHS 2)  Your physician recommends that you return for a FASTING LIPID and LIVER Profile in 1 MONTH (412, 272.0)--Nothing to eat or drink after midnight 3)  Your physician has recommended you make the following change in your medication: STOP Effient, START Brilinta 90mg  take one  tablet  two times a day (start on Saturday) 4)  Please contact Dr Jonny Ruiz to discuss anxiety issues Prescriptions: BRILINTA 90 MG TABS (TICAGRELOR) take one tablet by mouth two times a day  #60 x 6   Entered by:   Julieta Gutting, RN, BSN   Authorized by:   Norva Karvonen, MD   Signed by:   Julieta Gutting, RN, BSN on 01/15/2011   Method used:   Electronically to        [facility]  [address] [id number]       [address] [facility]       [place]  [place], [place]  [address]       Ph: [phone]       Fax: [phone]   RxID:   [id number]

## 2011-01-21 NOTE — Miscellaneous (Signed)
  Clinical Lists Changes  Observations: Added new observation of CARDCATHFIND: ASSESSMENT: 1. Total occlusion of the right coronary artery with successful     primary percutaneous coronary intervention using a single bare-     metal stent. 2. Minor nonobstructive disease involving the left anterior descending     and left circumflex. 3. Mild-to-moderate segmental left ventricular dysfunction consistent     with inferior wall myocardial infarction.   RECOMMENDATIONS:  The patient will be placed on ACS dose, bivalirudin for an additional 1 hour.  He will continue on aspirin and Effient for 1 year and we will institute post MI medical therapy.   (12/05/2010 10:38) Added new observation of CARDCATHFIND: FINAL CONCLUSIONS:  Successful percutaneous coronary intervention with adjunctive thrombectomy for treatment of acute stent thrombosis and reocclusion of the right coronary artery after treatment earlier today of an acute myocardial infarction.   PLAN:  We will treat the patient with 18 hours of Integrilin and then reviewed the situation in depth with the patient's family.  He will be monitored carefully in the CCU.   (12/05/2010 10:38)      Cardiac Cath  Procedure date:  12/05/2010  Findings:      FINAL CONCLUSIONS:  Successful percutaneous coronary intervention with adjunctive thrombectomy for treatment of acute stent thrombosis and reocclusion of the right coronary artery after treatment earlier today of an acute myocardial infarction.   PLAN:  We will treat the patient with 18 hours of Integrilin and then reviewed the situation in depth with the patient's family.  He will be monitored carefully in the CCU.    Cardiac Cath  Procedure date:  12/05/2010  Findings:      ASSESSMENT: 1. Total occlusion of the right coronary artery with successful     primary percutaneous coronary intervention using a single bare-     metal stent. 2. Minor nonobstructive disease  involving the left anterior descending     and left circumflex. 3. Mild-to-moderate segmental left ventricular dysfunction consistent     with inferior wall myocardial infarction.   RECOMMENDATIONS:  The patient will be placed on ACS dose, bivalirudin for an additional 1 hour.  He will continue on aspirin and Effient for 1 year and we will institute post MI medical therapy.

## 2011-01-21 NOTE — Assessment & Plan Note (Signed)
Summary: problems focusing/#/cd   Vital Signs:  Patient profile:   50 year old male Height:      64 inches Weight:      181.38 pounds BMI:     31.25 O2 Sat:      98 % on Room air Temp:     97.3 degrees F oral Pulse rate:   80 / minute BP sitting:   110 / 70  (left arm) Cuff size:   regular  Vitals Entered ByZella Glauber Ewing (March 13, 2010 11:37 AM)  O2 Flow:  Room air CC: focusing problems/RE   CC:  focusing problems/RE.  History of Present Illness: here with vague symtpoms of "blackness at the edge of his visioin", feeling "dazed" and dizzy , "tunnel vision"  and feeling like " something that just happened was like 8 yrs ago a few minutes later"  Saw opthomology with neg exam.  Seemed to be ongoing for approx 6 mo since he started his new job position in [place].  has some trouble focusing, ? short term memory loss  - worse in the past 2 wks, worse in the daytime and outside, better at night adn indoors,  worse "in motion", better when head is still, feels often "like I'm on the outside looking in," "lost", "spacy" "hungover from cold medicine", "not fully awake.":  but denies OSA symtpoms and Pt denies CP, sob, doe, wheezing, orthopnea, pnd, worsening LE edema, palps, dizziness or syncope   Pt denies new neuro symptoms such as headache, facial or extremity weakness   Has ongoing anxiety, but no panic.  Wife here today as well.    Problems Prior to Update: 1)  Allergic Rhinitis  (ICD-477.9) 2)  Unspecified Psychophysiological Malfunction  (ICD-306.9) 3)  Dizziness  (ICD-780.4) 4)  Back Pain  (ICD-724.5) 5)  Abdominal Pain, Generalized  (ICD-789.07) 6)  Allergic Rhinitis  (ICD-477.9) 7)  Anxiety  (ICD-300.00) 8)  Preventive Health Care  (ICD-V70.0) 9)  Prostate Cancer, Hx of  (ICD-V10.46) 10)  Morbid Obesity  (ICD-278.01) 11)  Hypertension, Borderline  (ICD-401.9) 12)  Gerd  (ICD-530.81) 13)  Asthma  (ICD-493.90) 14)  Glucose Intolerance  (ICD-271.3)  Medications Prior to  Update: 1)  Prilosec Otc 20 Mg Tbec (Omeprazole Magnesium) .Marland Kitchen.. 1 By Mouth Two Times A Day 2)  Cialis 20 Mg Tabs (Tadalafil) 3)  Flexeril 5 Mg Tabs (Cyclobenzaprine Hcl) .Marland Kitchen.. 1 By Mouth Three Times A Day As Needed 4)  Hydrocodone-Acetaminophen 5-325 Mg Tabs (Hydrocodone-Acetaminophen) .... 1/2 - 1 By Mouth Q 6 Hrs As Needed 5)  Prednisone 10 Mg Tabs (Prednisone) .... 3po Qd For 3days, Then 2po Qd For 3days, Then 1po Qd For 3days, Then Stop  Current Medications (verified): 1)  Prilosec Otc 20 Mg Tbec (Omeprazole Magnesium) .Marland Kitchen.. 1 By Mouth Two Times A Day 2)  Cialis 20 Mg Tabs (Tadalafil) 3)  Flexeril 5 Mg Tabs (Cyclobenzaprine Hcl) .Marland Kitchen.. 1 By Mouth Three Times A Day As Needed 4)  Hydrocodone-Acetaminophen 5-325 Mg Tabs (Hydrocodone-Acetaminophen) .... 1/2 - 1 By Mouth Q 6 Hrs As Needed 5)  Fexofenadine Hcl 180 Mg Tabs (Fexofenadine Hcl) .Marland Kitchen.. 1 By Mouth Once Daily As Needed  Allergies (verified): 1)  ! Amoxicillin  Past History:  Past Medical History: Last updated: 10/15/2008 Glucose Intolerance Asthma - mild intermittent GERD Hypertension Obesity Prostate cancer, hx of Anxiety Allergic rhinitis normal cor's by cath 1998 E.D.  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   Head:  normocephalic and atraumatic.   Eyes:  vision grossly intact, pupils equal, and pupils round.   Ears:  bilat tm's mild erythema, sinus nontender Nose:  no external deformity and no nasal discharge.   Mouth:  no gingival abnormalities and pharynx pink and moist.   Neck:  supple and  no masses.   Lungs:  normal respiratory effort and normal breath sounds.   Heart:  normal rate and regular rhythm.   Abdomen:  soft, non-tender, and normal bowel sounds.   Msk:  no joint tenderness and no joint swelling.   Extremities:  no edema, no erythema  Neurologic:  alert & oriented X3, cranial nerves II-XII intact, strength normal in all extremities, and DTRs symmetrical and normal.   Skin:  color normal and no rashes.   Psych:  Oriented X3, memory intact for recent and remote, normally interactive, good eye contact, and moderately anxious.     Impression & Recommendations:  Problem # 1:  DIZZINESS (ICD-780.4)  Orders: Radiology Referral (Radiology) Neurology Referral (Neuro)  His updated medication list for this problem includes:    Fexofenadine Hcl 180 Mg Tabs (Fexofenadine hcl) .Marland Kitchen... 1 by mouth once daily as needed treat as above, f/u any worsening signs or symptoms , ? allergy related inner ear related  Problem # 2:  UNSPECIFIED PSYCHOPHYSIOLOGICAL MALFUNCTION (ICD-306.9) ? anxiety vs other ;  for neurology consult, consider psychiatric as well but pt defers today Orders: Neurology Referral (Neuro)  Problem # 3:  ALLERGIC RHINITIS (ICD-477.9)  His updated medication list for this problem includes:    Fexofenadine Hcl 180 Mg Tabs (Fexofenadine hcl) .Marland Kitchen... 1 by mouth once daily as needed /treat as above, f/u any worsening signs or symptoms   Complete Medication List: 1)  Prilosec Otc 20 Mg Tbec (Omeprazole magnesium) .Marland Kitchen.. 1 by mouth two times a day 2)  Cialis 20 Mg Tabs (Tadalafil) 3)  Flexeril 5 Mg Tabs (Cyclobenzaprine hcl) .Marland Kitchen.. 1 by mouth three times a day as needed 4)  Hydrocodone-acetaminophen 5-325 Mg Tabs (Hydrocodone-acetaminophen) .... 1/2 - 1 by mouth q 6 hrs as needed 5)  Fexofenadine Hcl 180 Mg Tabs (Fexofenadine hcl) .Marland Kitchen.. 1 by mouth once daily as needed  Patient Instructions: 1)  Please take all new medications as prescribed -the antihistamine 2)   Continue all previous medications as before this visit 3)  You will be contacted about the referral(s) to: Head MRI, and neurology 4)  Please schedule a follow-up appointment in 6 months with CPX labs Prescriptions: FEXOFENADINE HCL 180 MG TABS (FEXOFENADINE HCL) 1 by mouth once daily as needed  #30 x 11   Entered and Authorized by:   Corwin Levins MD   Signed by:   Corwin Levins MD on 03/13/2010   Method used:   Print then Give to Patient   RxID:   [id number]

## 2011-01-21 NOTE — Assessment & Plan Note (Signed)
Summary: 6 MO ROV /NWS  #   Vital Signs:  Patient profile:   50 year old male Height:      64 inches Weight:      157.50 pounds O2 Sat:      97 % on Room air Temp:     97.5 degrees F oral Pulse rate:   70 / minute BP sitting:   100 / 70  (left arm) Cuff size:   regular  Vitals Entered By: Zella Civil Ewing CMA Duncan Dull) (September 18, 2010 9:09 AM)  O2 Flow:  Room air  Preventive Care Screening  Last Flu Shot:    Date:  09/17/2010    Results:  given   CC: 6 month/RE   CC:  6 month/RE.  History of Present Illness: here for wellness - overall lost 29 lbs per pt in the past 4 mo with better diet and ecercise;  Pt denies CP, worsening sob, doe, wheezing, orthopnea, pnd, worsening LE edema, palps, dizziness or syncope  Pt denies new neuro symptoms such as headache, facial or extremity weakness  Pt denies polydipsia, polyuria, or low sugar symptoms such as shakiness improved with eating.  Overall good compliance with meds, trying to follow low chol, diet, wt stable, little excercise however  Does c/o increase stress with work , without increased depressive symtpoms or suicidal ideation or panic.  Preventive Screening-Counseling & Management      Drug Use:  no.    Problems Prior to Update: 1)  Allergic Rhinitis  (ICD-477.9) 2)  Unspecified Psychophysiological Malfunction  (ICD-306.9) 3)  Dizziness  (ICD-780.4) 4)  Back Pain  (ICD-724.5) 5)  Abdominal Pain, Generalized  (ICD-789.07) 6)  Allergic Rhinitis  (ICD-477.9) 7)  Anxiety  (ICD-300.00) 8)  Preventive Health Care  (ICD-V70.0) 9)  Prostate Cancer, Hx of  (ICD-V10.46) 10)  Morbid Obesity  (ICD-278.01) 11)  Hypertension, Borderline  (ICD-401.9) 12)  Gerd  (ICD-530.81) 13)  Asthma  (ICD-493.90) 14)  Glucose Intolerance  (ICD-271.3)  Medications Prior to Update: 1)  Prilosec Otc 20 Mg Tbec (Omeprazole Magnesium) .Marland Kitchen.. 1 By Mouth Two Times A Day 2)  Cialis 20 Mg Tabs (Tadalafil) 3)  Flexeril 5 Mg Tabs (Cyclobenzaprine Hcl) .Marland Kitchen.. 1  By Mouth Three Times A Day As Needed 4)  Hydrocodone-Acetaminophen 5-325 Mg Tabs (Hydrocodone-Acetaminophen) .... 1/2 - 1 By Mouth Q 6 Hrs As Needed 5)  Fexofenadine Hcl 180 Mg Tabs (Fexofenadine Hcl) .Marland Kitchen.. 1 By Mouth Once Daily As Needed  Current Medications (verified): 1)  Prilosec Otc 20 Mg Tbec (Omeprazole Magnesium) .Marland Kitchen.. 1 By Mouth Two Times A Day 2)  Cialis 20 Mg Tabs (Tadalafil) 3)  Flexeril 5 Mg Tabs (Cyclobenzaprine Hcl) .Marland Kitchen.. 1 By Mouth Three Times A Day As Needed 4)  Hydrocodone-Acetaminophen 5-325 Mg Tabs (Hydrocodone-Acetaminophen) .... 1/2 - 1 By Mouth Q 6 Hrs As Needed 5)  Fexofenadine Hcl 180 Mg Tabs (Fexofenadine Hcl) .Marland Kitchen.. 1 By Mouth Once Daily As Needed  Allergies (verified): 1)  ! Amoxicillin  Past History:  Past Medical History: Last updated: 10-23-2008 Glucose Intolerance Asthma - mild intermittent GERD Hypertension Obesity Prostate cancer, hx of Anxiety Allergic rhinitis normal cor's by cath 1998 E.D.  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         all otherwise negative per pt -    Physical Exam  General:  alert and well-developed.   Head:  normocephalic and atraumatic.   Eyes:  vision grossly intact, pupils equal, and pupils round.   Ears:  R ear normal and L ear normal.   Nose:  no external deformity and no nasal discharge.   Mouth:  no gingival abnormalities and pharynx pink and moist.   Neck:  supple and no masses.   Lungs:  normal respiratory effort and normal breath sounds.   Heart:  normal rate and regular rhythm.   Abdomen:  soft, non-tender, and normal bowel sounds.   Msk:  no joint tenderness and no joint swelling.   Extremities:  no edema, no erythema  Neurologic:  cranial nerves II-XII intact, strength normal in all extremities, sensation intact to light touch, and DTRs symmetrical and normal.   Skin:  color normal and no rashes.   Psych:  not depressed appearing and moderately anxious.     Impression & Recommendations:  Problem # 1:  Preventive Health Care (ICD-V70.0) Overall doing well, age appropriate education and counseling updated and referral for appropriate preventive services done unless declined, immunizations up to date or declined, diet counseling done if overweight, urged to quit smoking if smokes , most recent labs reviewed and current ordered if appropriate, ecg reviewed or declined (interpretation per ECG scanned in the EMR if done); information regarding Medicare Prevention requirements given if appropriate; speciality referrals updated as appropriate  Orders: EKG w/ Interpretation (93000)  Problem # 2:  ANXIETY (ICD-300.00)  His updated medication list for this problem includes:    Alprazolam 0.5 Mg Tabs (Alprazolam) .Marland Kitchen... 1/2 by mouth two times a day as needed treat as above, f/u any worsening signs or  symptoms , delcines ssri trial  Complete Medication List: 1)  Prilosec Otc 20 Mg Tbec (Omeprazole magnesium) .Marland Kitchen.. 1 by mouth two times a day 2)  Cialis 20 Mg Tabs (Tadalafil) 3)  Alprazolam 0.5 Mg Tabs (Alprazolam) .... 1/2 by mouth two times a day as needed  Patient Instructions: 1)  your labs were faxed to Dr Annabell Howells 2)  Please take all new medications as prescribed 3)  Continue all previous medications as before this visit  4)  Please schedule a follow-up appointment in 1 year, or sooner if needed 5)  Please call for any refills Prescriptions: PRILOSEC OTC 20 MG TBEC (OMEPRAZOLE MAGNESIUM) 1 by mouth two times a day  #60 x 11   Entered and Authorized by:   Corwin Levins MD   Signed by:   Corwin Levins MD on 09/18/2010   Method used:   Print then Give to Patient   RxID:   [id number] ALPRAZOLAM 0.5 MG TABS (ALPRAZOLAM) 1/2 by mouth two times a day as needed  #30 x 3   Entered and Authorized by:  Corwin Levins MD   Signed by:   Corwin Levins MD on 09/18/2010   Method used:   Print then Give to Patient   RxID:   [id number]

## 2011-01-21 NOTE — Progress Notes (Signed)
Summary: c/o tired, not feeling well   Phone Note Call from Patient   Caller: Spouse- donna [phone] Reason for Call: Talk to Nurse Summary of Call: pt is d/c from [facility]c on 12/19 c/o tired, not feeling well.  Initial call taken by: Lorne Skeens,  December 09, 2010 12:14 PM  Follow-up for Phone Call        I spoke with the pt's wife and she said the pt is tired, weak eyed,  pale and has a yellowish discoloration. They have not been able to check the pt's BP and pulse since he came home to the hospital.  The pt's wife will get a BP cuff and check his pressure.  I will call back around 2:00.  Follow-up by: Julieta Gutting, RN, BSN,  December 09, 2010 1:23 PM  Additional Follow-up for Phone Call Additional follow up Details #1::        I spoke with pt's wife and the pt's vitals are Right arm sitting 113/80, 85 and standing 104/75, 89.  Left arm sitting  114/77, 74 and standing 98/77, 86.  I made her aware that the pt should remain well hydrated and that fatigue after an MI is normal.  I reviewed the pt's hospital labs and his hemoglobin and liver enzymes were normal. The pt's wife will call back if she has any other questions or concerns.  Julieta Gutting, RN, BSN  December 09, 2010 2:15 PM    Additional Follow-up for Phone Call Additional follow up Details #2::    d/c meds reviewed - no beta-blocker or ACE. agree stay hydrated and call back if worsening symptoms. Follow-up by: Norva Karvonen, MD,  December 09, 2010 5:02 PM

## 2011-01-21 NOTE — Letter (Signed)
Summary: ER Notification  Architectural technologist, Main Office  [address]   [place], [place] [address]   Phone: [phone]  Fax: [phone]    December 22, 2010 11:48 AM  Heath Gold  The above referenced patient has been advised to report directly to the Emergency Room. Please see below for more information:  Dx: ____CP________     Private Vehicle  _______________ or EMS:  _______x_____- coming from the office.   Orders:  Yes __x____ or No  _______   Notify upon arrival:     Trish [phone]       Or _________________   Thank you,    [facility] Staff

## 2011-01-21 NOTE — Assessment & Plan Note (Signed)
Summary: eph  Medications Added PEPCID 20 MG TABS (FAMOTIDINE) take one tab by mouth two times a day as needed EFFIENT 10 MG TABS (PRASUGREL HCL) Take one tablet by mouth daily ASPIRIN EC 325 MG TBEC (ASPIRIN) Take one tablet by mouth daily LIPITOR 80 MG TABS (ATORVASTATIN CALCIUM) Take one tablet by mouth daily. TRAMADOL HCL 50 MG TABS (TRAMADOL HCL) take 1 tablet every 6 hours as needed METOPROLOL SUCCINATE 25 MG XR24H-TAB (METOPROLOL SUCCINATE) Take 1/2  tablet by mouth daily      Allergies Added:   History of Present Illness: Primary Cardiologist:  Dr. Tonny Bollman  [name] Gross is a 50 yo male who was admitted to [facility] on 12/04/2010 with an acute inferior STEMI.  He was treated with a BMS to the RCA which was occluded in the mid vessel.  Post intervention he developed recurrent chest pain with ST elevation.  He underwent emergent relook cath which demonstrated acute stent thrombosis and he was treated with a second BMS with adjunctive thrombectomy.  EF was low at 45% with inferior dyskinesis on cath.  Echo 12/06/2010 demonstrated an EF 55-65%; ? inferior HK and mild MR.  Otherwise, his post MI course was uneventful.  Use of a beta blocker or ACE inhibitor was precluded due to low blood pressures.  He returns for follow up.  The patient was defibrillated x2 when he had recurrent chest symptoms and ST elevation.  He also notes having been defibrillated in the ambulance.  He has a copy of the strips and upon my review appears that he was just related twice.  He has had an ache in his chest since his myocardial infarction.  He feels it in his arms at times.  He denies any radiation to his jaw.  It is nonexertional.  It is nothing like what he had with his myocardial infarction.  He has been walking several times a week without chest discomfort or shortness of breath.  Overall he feels much better than he did prior to his heart attack.  He denies orthopnea, PND or pedal edema.  He denies  syncope or near-syncope.  He denies palpitations.  He denies any chest discomfort related to meals.  He does take Prilosec on occasion as needed.  He denies any chest symptoms with certain movements.  After a left the room, patient became lightheaded and felt as though he may pass out.  His left arm pain got worse.  We repeated an EKG.  This was treated no significant change.  I asked Dr. Jens Som come see him.  We decided to put him into the hospital for observation.  Current Medications (verified): 1)  Prilosec Otc 20 Mg Tbec (Omeprazole Magnesium) .Marland Kitchen.. 1 By Mouth Two Times A Day 2)  Cialis 20 Mg Tabs (Tadalafil) 3)  Alprazolam 0.5 Mg Tabs (Alprazolam) .... 1/2 By Mouth Two Times A Day As Needed 4)  Effient 10 Mg Tabs (Prasugrel Hcl) .... Take One Tablet By Mouth Daily 5)  Aspirin Ec 325 Mg Tbec (Aspirin) .... Take One Tablet By Mouth Daily 6)  Lipitor 80 Mg Tabs (Atorvastatin Calcium) .... Take One Tablet By Mouth Daily. 7)  Tramadol Hcl 50 Mg Tabs (Tramadol Hcl) .... Take 1 Tablet Every 6 Hours As Needed  Allergies (verified): 1)  ! Amoxicillin  Past History:  Past Medical History: CAD   a. s/p INF STEMI 12/04/2010: treated with BMS to RCA c/b acute stent thrombosis at completion of primary PCI    b. above  tx with second BMS with adjunctive thrombectomy   c. residual CAD at cath 12/04/2010: pRCA 40-50%; LAD and CFX ok; EF 45% Echo 12/06/2010: Ef 55-65%; ?HK inferior wall; mild MR Glucose Intolerance Asthma - mild intermittent GERD Hypertension Obesity Prostate cancer, hx of Anxiety Allergic rhinitis E.D.  Past Surgical History: Reviewed history from 10/17/2009 and no changes required. Sinus surgery L Knee Surgery Prostatectomy s/p right spermactocelectomy s/p wrist surgury s/p right elbow surgury 01/2009  Social History: Reviewed history from 09/18/2010 and no changes required. 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  All other systems reviewed and negative.   Vital Signs:  Patient profile:   50 year old male Height:      64 inches Weight:      153 pounds BMI:     26.36 Pulse rate:   89 / minute BP sitting:   119 / 74  (left arm) Cuff size:   regular  Vitals Entered By: Caralee Ates CMA (December 22, 2010 9:49 AM)  Physical Exam  General:  Well nourished, well developed, in no acute distress HEENT: normal Neck: no JVD Cardiac:  normal S1, S2; RRR; no murmur Lungs:  clear to auscultation bilaterally, no wheezing, rhonchi or rales Abd: soft, nontender, no hepatomegaly Ext: no edema; bilat FA sites without hematoma or bruits Vascular: no carotid  bruits Skin: warm and dry Neuro:  CNs 2-12 intact, no focal abnormalities noted    EKG  Procedure date:  12/22/2010  Findings:      Normal Sinus Rhythm Heart rate 84 Normal axis Inferior Q waves with associated T-wave inversions in leads 3 and aVF, subtle T-wave inversion in V5 and T wave inversion noted in V6 Evolving inferior myocardial infarction  Impression & Recommendations:  Problem # 1:  ACUT MYOCARD INFARCT OTH INF WALL EPIS CARE UNS (ICD-410.40) BP would not tolerate beta blocker or ACE in the hospital. He should be able to tolerate a very low dose beta blocker now. Will start Toprol 25 mg  half a tab daily. Will set him up for cardiac Rehab at [facility] in [place].  He works in [place]. Answered all of his questions today.  Problem # 2:  CORONARY ATHEROSCLEROSIS NATIVE CORONARY ARTERY (ICD-414.01) Continue ASA, Effient and statin.  Orders: EKG w/ Interpretation (93000)  Problem # 3:  DYSLIPIDEMIA (ICD-272.4) Check FLP and LFTs in 6 weeks.  His updated medication list for this problem includes:    Lipitor 80 Mg Tabs (Atorvastatin calcium) .Marland Kitchen... Take one tablet by mouth daily.  Problem # 4:  CHEST PAIN UNSPECIFIED (ICD-786.50) Atypical symptoms.  But, with his recent  presentation for his MI, will admit him for observation and check serial cardiac markers.  Problem # 5:  GERD (ICD-530.81) Only takes PPI as needed.  With possible interaction with Effient, stop prilosec. Start Pepcid two times a day as needed.  His updated medication list for this problem includes:    Pepcid 20 Mg Tabs (Famotidine) .Marland Kitchen... Take one tab by mouth two times a day as needed  Patient Instructions: 1)  Start Toprol (metoprolol) 25mg  1/2 tablet once daily. 2)  Your physician recommends that you schedule a follow-up appointment in: 2-3 weeks with Dr. Excell Seltzer. 3)  We will refer you to [facility]- they should be in touch with you once we submit your paperwork. 4)  Stop Prilosec.  5)  Start Pepcid 20mg  two times a day as needed. 6)  You will need to return for FASTING labwork in 6 weeks: lipid/liver (410.40;414.01;272.4).

## 2011-01-21 NOTE — Miscellaneous (Signed)
Summary: [facility]  [facility]   Imported By: Lester [place] 09/24/2010 10:00:46  _____________________________________________________________________  External Attachment:    Type:   Image     Comment:   External Document

## 2011-01-21 NOTE — Progress Notes (Signed)
Summary: reaction to meds--rash   Phone Note Call from Patient Call back at [phone]   Caller: Spouse/donna Reason for Call: Talk to Nurse Summary of Call: pt having reaction to meds pt has a rash. Pt also having anxiety issues. Initial call taken by: Roe Coombs,  January 13, 2011 9:18 AM  Follow-up for Phone Call        I spoke with the pt's wife and she said the pt developed tiny blisters on both his legs one week ago.  The rash has now spread to the pt's left arm.  The pt has been taking Benadryl and using lotion on rash.  The pt has also been having panic attacks and takes Xanax as needed.  I made her aware that the anxiety issue should be discussed with Dr Jonny Ruiz for further management.  Follow-up by: Julieta Gutting, RN, BSN,  January 13, 2011 12:12 PM  Additional Follow-up for Phone Call Additional follow up Details #1::        Recommend for pt to continue benadryl and hydrocortisone cream. Come in Friday am so that I can examine him. Additional Follow-up by: Norva Karvonen, MD,  January 13, 2011 1:43 PM    Additional Follow-up for Phone Call Additional follow up Details #2::    I spoke with the pt's wife and made her aware of Dr Earmon Phoenix recommendations.  Appt scheduled on 01/15/11 at 10:00 for evaluation of rash.  Follow-up by: Julieta Gutting, RN, BSN,  January 13, 2011 2:15 PM

## 2011-01-21 NOTE — Consult Note (Signed)
Summary: [facility]  [facility]   Imported By: Sherian Rein 04/24/2010 13:46:41  _____________________________________________________________________  External Attachment:    Type:   Image     Comment:   External Document

## 2011-01-21 NOTE — Medication Information (Signed)
Summary: Fexofendine/[facility]  [facility]exofendine/[facility]   Imported By: Sherian Rein 04/06/2010 13:27:30  _____________________________________________________________________  External Attachment:    Type:   Image     Comment:   External Document

## 2011-01-21 NOTE — Miscellaneous (Signed)
Summary: Physician Order/Treatment Plan  Physician Order/Treatment Plan   Imported By: Roderic Ovens 01/05/2011 13:30:34  _____________________________________________________________________  External Attachment:    Type:   Image     Comment:   External Document

## 2011-01-21 NOTE — Progress Notes (Signed)
Summary: need letter sent to insurance company   Phone Note Call from Patient Call back at [phone]   Caller: Spouse/ Lupita Leash Summary of Call: PT was placed in hospital on 12/22/10 and the pt wife is needing a letter wrote for her insurnace company tell why the pt was put in the hospital call pt wife back. Initial call taken by: Judie Grieve,  January 01, 2011 9:29 AM  Follow-up for Phone Call        Patient's wife states that her husband was sent to the ER at his OV with Tereso Newcomer on 12/22/10 and she needs a letter from the doctor stating why it was medically necessary that he went to the ER. His insurance is denying CP..? She will fax the information to our office. She said that Dr. Jens Som admitted him. I will look for the fax and either give the information to Tereso Newcomer or to Dr. Jens Som to review. Whitney Maeola Sarah RN  January 01, 2011 9:43 AM  Follow-up by: Whitney Maeola Sarah RN,  January 01, 2011 9:43 AM

## 2011-01-21 NOTE — Progress Notes (Signed)
Summary: Fexofenadine PA  Phone Note From Pharmacy   Summary of Call: PA request--Fexofenadine. This is available to be done online via [email or web address] and asks: Has the patient PREVIOUSLY RECEIVED any form of loratidine OTC (for example Alavert, Claritin OTC, loratidine OTC, etc.) or cetirizine OTC (for example Zyrtec OTC, cetirizine OTC, etc)?    Please advise. Initial call taken by: Lucious Groves,  March 16, 2010 4:34 PM  Follow-up for Phone Call        yes, loratidine did not work, zyrtec too sedating Follow-up by: Corwin Levins MD,  March 16, 2010 5:42 PM  Additional Follow-up for Phone Call Additional follow up Details #1::        done online and approved until 2012. Additional Follow-up by: Lucious Groves,  March 23, 2010 9:49 AM

## 2011-01-22 ENCOUNTER — Ambulatory Visit: Admit: 2011-01-22 | Payer: Self-pay | Admitting: Cardiovascular Disease

## 2011-01-22 ENCOUNTER — Encounter: Payer: Self-pay | Admitting: Cardiovascular Disease

## 2011-01-26 ENCOUNTER — Telehealth (INDEPENDENT_AMBULATORY_CARE_PROVIDER_SITE_OTHER): Payer: Self-pay | Admitting: *Deleted

## 2011-01-26 ENCOUNTER — Telehealth: Payer: Self-pay | Admitting: Cardiovascular Disease

## 2011-01-27 ENCOUNTER — Encounter: Payer: Self-pay | Admitting: Internal Medicine

## 2011-01-27 NOTE — Letter (Signed)
Summary: [facility]  [facility]   Imported By: Marylou Mccoy 01/20/2011 15:06:17  _____________________________________________________________________  External Attachment:    Type:   Image     Comment:   External Document

## 2011-01-27 NOTE — Assessment & Plan Note (Signed)
Summary: PANIC ATTACKS SINCE HEART ATTACK IN DEC/ PER DR Excell Seltzer Natale Milch  #   Vital Signs:  Patient profile:   50 year old male Height:      64 inches Weight:      152.25 pounds BMI:     26.23 O2 Sat:      99 % on Room air Temp:     97.6 degrees F oral Pulse rate:   91 / minute BP sitting:   120 / 78  (left arm) Cuff size:   regular  Vitals Entered By: Zella Rundell Ewing CMA Duncan Dull) (January 19, 2011 10:13 AM)  O2 Flow:  Room air CC: Panic Attacks/RE   Primary Care Provider:  Corwin Levins MD  CC:  Panic Attacks/RE.  History of Present Illness: here to f/u;  overall doing ok but xanax not working as well recently for panic control, and seems to have overall increased mod to severe anxiety symptoms with several episodes of panic as well in the last 2 months;  Since last seen by cards, Pt denies recent CP, worsening sob, doe, wheezing, orthopnea, pnd, worsening LE edema, palps, dizziness or syncope.  Pt denies new neuro symptoms such as headache, facial or extremity weakness  Pt denies polydipsia, polyuria,  Overall good compliance with meds, trying to follow low chol, diet, wt down a few lbs, little excercise however until he finishes cardiac rehab. . Did go  back to work yesterday, but job is sedentary, although can still be stressful at time. On new med regimen - Overall good compliance with meds, and good tolerability.  no overt bleeding or bruising on the brillenta (had to change from effient due to rash).   Denies worsening depressive symptoms, suicidal ideation.  No fever, night sweats, loss of appetite or other constitutional symptoms .  Wife very supportive .   Other family member has done well with lexapro. Pt not tried on SSRI in the past.   Was also seen per neuro last yr  - found on w/u to have mild OSA  but pt has declines further treatment such as CPAP.  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  (ICD-410.40) 6)  Coronary Atherosclerosis Native Coronary Artery  (ICD-414.01) 7)  Allergic Rhinitis  (ICD-477.9) 8)  Unspecified Psychophysiological Malfunction  (ICD-306.9) 9)  Dizziness  (ICD-780.4) 10)  Back Pain  (ICD-724.5) 11)  Abdominal Pain, Generalized  (ICD-789.07) 12)  Allergic Rhinitis  (ICD-477.9) 13)  Anxiety  (ICD-300.00) 14)  Preventive Health Care  (ICD-V70.0) 15)  Prostate Cancer, Hx of  (ICD-V10.46) 16)  Morbid Obesity  (ICD-278.01) 17)  Hypertension, Borderline  (ICD-401.9) 18)  Gerd  (ICD-530.81) 19)  Asthma  (ICD-493.90) 20)  Glucose Intolerance  (ICD-271.3)  Medications Prior to Update: 1)  Pepcid 20 Mg Tabs (Famotidine) .... Take One Tab By Mouth Two Times A Day As Needed 2)  Cialis 20 Mg Tabs (Tadalafil) 3)  Alprazolam 0.5 Mg Tabs (Alprazolam) .... 1/2 By Mouth Two Times A Day As Needed 4)  Brilinta 90 Mg Tabs (Ticagrelor) .... Take One Tablet By Mouth Two Times A Day 5)  Aspirin Ec 325 Mg Tbec (Aspirin) .... Take One Tablet By Mouth Daily 6)  Lipitor 80 Mg Tabs (Atorvastatin Calcium) .... Take One Tablet By Mouth Daily. 7)  Tramadol  Hcl 50 Mg Tabs (Tramadol Hcl) .... Take 1 Tablet Every 6 Hours As Needed 8)  Metoprolol Succinate 25 Mg Xr24h-Tab (Metoprolol Succinate) .... Take 1/2  Tablet By Mouth Daily 9)  Aspirin 81 Mg Tbec (Aspirin) .... Take One Tablet By Mouth Daily 10)  Nitrostat 0.4 Mg Subl (Nitroglycerin) .Marland Kitchen.. 1 Tablet Under Tongue At Onset of Chest Pain; You May Repeat Every 5 Minutes For Up To 3 Doses.  Current Medications (verified): 1)  Pepcid 20 Mg Tabs (Famotidine) .... Take One Tab By Mouth Two Times A Day As Needed 2)  Cialis 20 Mg Tabs (Tadalafil) 3)  Alprazolam 0.5 Mg Tabs (Alprazolam) .... 1/2 By Mouth Two Times A Day As Needed 4)  Brilinta 90 Mg Tabs (Ticagrelor) .... Take One Tablet By Mouth Two Times A Day 5)   Aspirin Ec 325 Mg Tbec (Aspirin) .... Take One Tablet By Mouth Daily 6)  Lipitor 80 Mg Tabs (Atorvastatin Calcium) .... Take One Tablet By Mouth Daily. 7)  Tramadol Hcl 50 Mg Tabs (Tramadol Hcl) .... Take 1 Tablet Every 6 Hours As Needed 8)  Metoprolol Succinate 25 Mg Xr24h-Tab (Metoprolol Succinate) .... Take 1/2  Tablet By Mouth Daily 9)  Aspirin 81 Mg Tbec (Aspirin) .... Take One Tablet By Mouth Daily 10)  Nitrostat 0.4 Mg Subl (Nitroglycerin) .Marland Kitchen.. 1 Tablet Under Tongue At Onset of Chest Pain; You May Repeat Every 5 Minutes For Up To 3 Doses. 11)  Lexapro 10 Mg Tabs (Escitalopram Oxalate) .Marland Kitchen.. 1po Once Daily  Allergies (verified): 1)  ! Amoxicillin  Past History:  Past Surgical History: Last updated: 01/14/2011  bare-metal stenting x2 of the right    coronary artery, December 2011. 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 s/p INF STEMI 12/04/2010: treated with BMS to RCA c/b acute stent thrombosis at completion of primary PCI    b. above tx with second BMS with adjunctive thrombectomy   c. residual CAD at cath 12/04/2010: pRCA 40-50%; LAD and CFX ok; EF 45% Echo 12/06/2010: Ef 55-65%; ?HK inferior wall; mild MR Glucose Intolerance Asthma - mild intermittent GERD Hypertension Obesity Prostate cancer, hx of Anxiety Allergic rhinitis  Osteoarthritis.  Erectile dysfunction E.D. OSA - mild, no CPAP per pt preference  Review of Systems       all otherwise negative per pt -    Physical Exam  General:  alert and well-developed.   Head:  normocephalic and atraumatic.   Eyes:  vision grossly intact, pupils equal, and pupils round.   Ears:  R ear normal and L ear normal.    Nose:  no external deformity and no nasal discharge.   Mouth:  no gingival abnormalities and pharynx pink and moist.   Neck:  supple and no masses.   Lungs:  normal respiratory effort and normal breath sounds.   Heart:  normal rate and regular rhythm.   Extremities:  no edema, no erythema  Skin:  color normal and no rashes.   Psych:  not depressed appearing and moderately anxious.     Impression & Recommendations:  Problem # 1:  ANXIETY (ICD-300.00)  His updated medication list for this problem includes:    Alprazolam 0.5 Mg Tabs (Alprazolam) .Marland Kitchen... 1/2 by mouth two times a day as  needed    Lexapro 10 Mg Tabs (Escitalopram oxalate) .Marland Kitchen... 1po once daily  Discussed medication use and relaxation techniques. ;  treat as above, f/u any worsening signs or symptoms . consider f/u in 4 wks if not improved  Problem # 2:  SLEEP APNEA, OBSTRUCTIVE (ICD-327.23) mild symptoms at best, pt defers tx for now  Problem # 3:  DYSLIPIDEMIA (ICD-272.4)  His updated medication list for this problem includes:    Lipitor 80 Mg Tabs (Atorvastatin calcium) .Marland Kitchen... Take one tablet by mouth daily. on very high liptor out of proportion to his LDL from sept, but likely to be related to recent post MI tx and plaque issue;  pt to f/u with Dr Excell Seltzer but suspect dose can be decreased at some point  Labs Reviewed: SGOT: 23 (09/17/2010)   SGPT: 25 (09/17/2010)   HDL:44.90 (09/17/2010), 42.10 (10/13/2009)  LDL:104 (09/17/2010), 101 (10/13/2009)  Chol:178 (09/17/2010), 172 (10/13/2009)  Trig:144.0 (09/17/2010), 144.0 (10/13/2009)  Problem # 4:  LONG-TERM (CURRENT) USE OF ANTICOAGULANTS (ICD-V58.61) on brillenta - to cont of course as per Dr Excell Seltzer;  pt had question regarding timing of screening colonscopy - I suggested to wait for at least total one yr brillenta before consider holding to get the colonoscopy done; or sooner colon screening if able to come off the brillenta  Complete Medication List: 1)  Pepcid 20 Mg  Tabs (Famotidine) .... Take one tab by mouth two times a day as needed 2)  Cialis 20 Mg Tabs (Tadalafil) 3)  Alprazolam 0.5 Mg Tabs (Alprazolam) .... 1/2 by mouth two times a day as needed 4)  Brilinta 90 Mg Tabs (Ticagrelor) .... Take one tablet by mouth two times a day 5)  Aspirin Ec 325 Mg Tbec (Aspirin) .... Take one tablet by mouth daily 6)  Lipitor 80 Mg Tabs (Atorvastatin calcium) .... Take one tablet by mouth daily. 7)  Tramadol Hcl 50 Mg Tabs (Tramadol hcl) .... Take 1 tablet every 6 hours as needed 8)  Metoprolol Succinate 25 Mg Xr24h-tab (Metoprolol succinate) .... Take 1/2  tablet by mouth daily 9)  Aspirin 81 Mg Tbec (Aspirin) .... Take one tablet by mouth daily 10)  Nitrostat 0.4 Mg Subl (Nitroglycerin) .Marland Kitchen.. 1 tablet under tongue at onset of chest pain; you may repeat every 5 minutes for up to 3 doses. 11)  Lexapro 10 Mg Tabs (Escitalopram oxalate) .Marland Kitchen.. 1po once daily  Patient Instructions: 1)  Please take all new medications as prescribed 2)  Continue all previous medications as before this visit  3)  Please keep your rehab appt and Dr Excell Seltzer as you have planned 4)  Please schedule a follow-up appointment in Sept 2012 for CPX with labs Prescriptions: LEXAPRO 10 MG TABS (ESCITALOPRAM OXALATE) 1po once daily  #90 x 3   Entered and Authorized by:   Corwin Levins MD   Signed by:   Corwin Levins MD on 01/19/2011   Method used:   Electronically to        [facility]  [address] [phone]* (retail)       [address]       [place], [place]  [address]       Ph: [phone]       Fax: [phone]   RxID:   [id number]    Orders Added: 1)  Est. Patient Level IV [24401]

## 2011-02-04 NOTE — Progress Notes (Signed)
Summary: rash is back  Phone Note Call from Patient Call back at [phone]   Caller: Spouse/Donna Reason for Call: Talk to Nurse, Talk to Doctor Summary of Call: pt's ichy rash has come back and they need to know what to do Initial call taken by: Omer Jack,  January 26, 2011 3:56 PM  Follow-up for Phone Call        I spoke with the pt's wife and the pt's rash has come back. The pt's rash did go away for a short time after switching from Effient.  I made her aware that the pt cannot stop Brilinta at this time.  The pt is using hydrocortisone cream and benadryl for rash. I will forward this message to Dr Excell Seltzer to see if he has further recommendations regarding rash and medications.   Follow-up by: Julieta Gutting, RN, BSN,  January 26, 2011 4:10 PM  Additional Follow-up for Phone Call Additional follow up Details #1::        Spoke with patient's wife and left msg with patient on his cell phone. Recommend that he sees Dr Jonny Ruiz regarding his rash - I'm not sure if it's med related. Additional Follow-up by: Norva Karvonen, MD,  January 29, 2011 9:49 AM    Additional Follow-up for Phone Call Additional follow up Details #2::    I spoke with the pt and made him aware of Dr Earmon Phoenix recommendation.  The pt will follow-up with Dr Jonny Ruiz for rash.  Julieta Gutting, RN, BSN  January 29, 2011 10:09 AM

## 2011-02-04 NOTE — Progress Notes (Signed)
Summary: Pa-Lexapro  Phone Note From Pharmacy   Summary of Call: PA-Lexapro, awaiting form form [employer]. [name] Gross  January 26, 2011 4:16 PM Form to Dr Jonny Ruiz to complete. [name] Gross  January 26, 2011 4:37 PM PA faxed to [facility] @ [phone], awaiting approval. [name] Gross  January 27, 2011 3:13 PM Pa approved 01/06/11 -12/19/2098, pt aware.  Initial call taken by: [name] Gross,  January 29, 2011 4:11 PM

## 2011-02-10 NOTE — Medication Information (Signed)
Summary: Prior autho & approved for Lexapro/[employer]  Prior autho & approved for Lexapro/[employer]   Imported By: Sherian Rein 02/02/2011 14:48:14  _____________________________________________________________________  External Attachment:    Type:   Image     Comment:   External Document

## 2011-02-12 ENCOUNTER — Other Ambulatory Visit: Payer: Self-pay | Admitting: Cardiovascular Disease

## 2011-02-12 ENCOUNTER — Encounter: Payer: Self-pay | Admitting: Cardiovascular Disease

## 2011-02-12 ENCOUNTER — Other Ambulatory Visit (INDEPENDENT_AMBULATORY_CARE_PROVIDER_SITE_OTHER): Payer: BC Managed Care – PPO

## 2011-02-12 DIAGNOSIS — E785 Hyperlipidemia, unspecified: Secondary | ICD-10-CM

## 2011-02-12 LAB — LIPID PANEL
Cholesterol: 110 mg/dL (ref 0–200)
HDL: 45.4 mg/dL (ref >39.00)
LDL Cholesterol: 49 mg/dL (ref 0–99)
Total CHOL/HDL Ratio: 2
Triglycerides: 76 mg/dL (ref 0.0–149.0)
VLDL: 15.2 mg/dL (ref 0.0–40.0)

## 2011-02-12 LAB — HEPATIC FUNCTION PANEL
ALT: 59 U/L — ABNORMAL HIGH (ref 0–53)
AST: 35 U/L (ref 0–37)
Albumin: 4.3 g/dL (ref 3.5–5.2)
Alkaline Phosphatase: 84 U/L (ref 39–117)
Bilirubin, Direct: 0.1 mg/dL (ref 0.0–0.3)
Total Bilirubin: 0.5 mg/dL (ref 0.3–1.2)
Total Protein: 6.3 g/dL (ref 6.0–8.3)

## 2011-03-01 LAB — POCT I-STAT, CHEM 8
BUN: 10 mg/dL (ref 6–23)
BUN: 6 mg/dL (ref 6–23)
Calcium, Ion: 1.17 mmol/L (ref 1.12–1.32)
Chloride: 105 mEq/L (ref 96–112)
Creatinine, Ser: 0.9 mg/dL (ref 0.4–1.5)
Creatinine, Ser: 1.1 mg/dL (ref 0.4–1.5)
Glucose, Bld: 123 mg/dL — ABNORMAL HIGH (ref 70–99)
HCT: 41 % (ref 39.0–52.0)
Hemoglobin: 13.9 g/dL (ref 13.0–17.0)
Potassium: 3.2 mEq/L — ABNORMAL LOW (ref 3.5–5.1)
Sodium: 140 mEq/L (ref 135–145)
Sodium: 141 mEq/L (ref 135–145)
TCO2: 25 mmol/L (ref 0–100)
TCO2: 30 mmol/L (ref 0–100)

## 2011-03-01 LAB — BASIC METABOLIC PANEL
BUN: 10 mg/dL (ref 6–23)
BUN: 8 mg/dL (ref 6–23)
CO2: 28 mEq/L (ref 19–32)
Calcium: 8.8 mg/dL (ref 8.4–10.5)
Chloride: 106 mEq/L (ref 96–112)
Chloride: 108 mEq/L (ref 96–112)
Creatinine, Ser: 1.08 mg/dL (ref 0.4–1.5)
GFR calc Af Amer: >60 mL/min (ref >60)
GFR calc non Af Amer: >60 mL/min (ref >60)
Potassium: 3.7 mEq/L (ref 3.5–5.1)
Potassium: 4.1 mEq/L (ref 3.5–5.1)
Sodium: 139 mEq/L (ref 135–145)
Sodium: 144 mEq/L (ref 135–145)

## 2011-03-01 LAB — COMPREHENSIVE METABOLIC PANEL
ALT: 21 U/L (ref 0–53)
ALT: 33 U/L (ref 0–53)
AST: 21 U/L (ref 0–37)
Albumin: 3.4 g/dL — ABNORMAL LOW (ref 3.5–5.2)
Alkaline Phosphatase: 52 U/L (ref 39–117)
Alkaline Phosphatase: 86 U/L (ref 39–117)
BUN: 9 mg/dL (ref 6–23)
CO2: 24 mEq/L (ref 19–32)
CO2: 29 mEq/L (ref 19–32)
Calcium: 8.3 mg/dL — ABNORMAL LOW (ref 8.4–10.5)
Chloride: 105 mEq/L (ref 96–112)
Chloride: 109 mEq/L (ref 96–112)
Creatinine, Ser: 1.1 mg/dL (ref 0.4–1.5)
GFR calc Af Amer: >60 mL/min (ref >60)
GFR calc non Af Amer: >60 mL/min (ref >60)
Glucose, Bld: 105 mg/dL — ABNORMAL HIGH (ref 70–99)
Glucose, Bld: 125 mg/dL — ABNORMAL HIGH (ref 70–99)
Potassium: 3.2 mEq/L — ABNORMAL LOW (ref 3.5–5.1)
Potassium: 3.8 mEq/L (ref 3.5–5.1)
Sodium: 140 mEq/L (ref 135–145)
Sodium: 141 mEq/L (ref 135–145)
Total Bilirubin: 0.6 mg/dL (ref 0.3–1.2)
Total Protein: 5.2 g/dL — ABNORMAL LOW (ref 6.0–8.3)
Total Protein: 6.7 g/dL (ref 6.0–8.3)

## 2011-03-01 LAB — DIFFERENTIAL
Basophils Absolute: 0 10*3/uL (ref 0.0–0.1)
Basophils Relative: 0 % (ref 0–1)
Eosinophils Absolute: 0 10*3/uL (ref 0.0–0.7)
Eosinophils Relative: 0 % (ref 0–5)
Lymphocytes Relative: 5 % — ABNORMAL LOW (ref 12–46)
Lymphs Abs: 0.9 10*3/uL (ref 0.7–4.0)
Monocytes Absolute: 0.9 10*3/uL (ref 0.1–1.0)
Monocytes Relative: 6 % (ref 3–12)
Neutro Abs: 14.1 10*3/uL — ABNORMAL HIGH (ref 1.7–7.7)
Neutrophils Relative %: 89 % — ABNORMAL HIGH (ref 43–77)

## 2011-03-01 LAB — CORTISOL-AM, BLOOD: Cortisol - AM: 3.4 ug/dL — ABNORMAL LOW (ref 4.3–22.4)

## 2011-03-01 LAB — CARDIAC PANEL(CRET KIN+CKTOT+MB+TROPI)
CK, MB: 1.1 ng/mL (ref 0.3–4.0)
CK, MB: 13.2 ng/mL (ref 0.3–4.0)
Relative Index: 2.9 — ABNORMAL HIGH (ref 0.0–2.5)
Relative Index: INVALID (ref 0.0–2.5)
Total CK: 448 U/L — ABNORMAL HIGH (ref 7–232)
Total CK: 58 U/L (ref 7–232)
Total CK: 801 U/L — ABNORMAL HIGH (ref 7–232)
Troponin I: 0.02 ng/mL (ref 0.00–0.06)
Troponin I: 12.19 ng/mL (ref 0.00–0.06)
Troponin I: 7.86 ng/mL (ref 0.00–0.06)

## 2011-03-01 LAB — CBC
HCT: 38.5 % — ABNORMAL LOW (ref 39.0–52.0)
HCT: 39.4 % (ref 39.0–52.0)
HCT: 39.6 % (ref 39.0–52.0)
HCT: 39.7 % (ref 39.0–52.0)
Hemoglobin: 13.5 g/dL (ref 13.0–17.0)
Hemoglobin: 13.6 g/dL (ref 13.0–17.0)
MCH: 29.1 pg (ref 26.0–34.0)
MCH: 29.3 pg (ref 26.0–34.0)
MCHC: 34.3 g/dL (ref 30.0–36.0)
MCHC: 34.3 g/dL (ref 30.0–36.0)
MCV: 84.9 fL (ref 78.0–100.0)
MCV: 85.3 fL (ref 78.0–100.0)
MCV: 86.3 fL (ref 78.0–100.0)
Platelets: 227 10*3/uL (ref 150–400)
Platelets: 244 10*3/uL (ref 150–400)
Platelets: 253 10*3/uL (ref 150–400)
Platelets: 286 10*3/uL (ref 150–400)
RBC: 4.46 MIL/uL (ref 4.22–5.81)
RBC: 4.64 MIL/uL (ref 4.22–5.81)
RBC: 4.64 MIL/uL (ref 4.22–5.81)
RBC: 4.95 MIL/uL (ref 4.22–5.81)
RDW: 12.1 % (ref 11.5–15.5)
RDW: 12.3 % (ref 11.5–15.5)
RDW: 12.3 % (ref 11.5–15.5)
RDW: 12.6 % (ref 11.5–15.5)
WBC: 11.4 10*3/uL — ABNORMAL HIGH (ref 4.0–10.5)
WBC: 13.5 10*3/uL — ABNORMAL HIGH (ref 4.0–10.5)
WBC: 15.9 10*3/uL — ABNORMAL HIGH (ref 4.0–10.5)
WBC: 7.8 10*3/uL (ref 4.0–10.5)
WBC: 9.4 10*3/uL (ref 4.0–10.5)

## 2011-03-01 LAB — TROPONIN I: Troponin I: 0.02 ng/mL (ref 0.00–0.06)

## 2011-03-01 LAB — MAGNESIUM: Magnesium: 2 mg/dL (ref 1.5–2.5)

## 2011-03-01 LAB — PROTIME-INR
Prothrombin Time: 13.4 seconds (ref 11.6–15.2)
Prothrombin Time: 15.1 seconds (ref 11.6–15.2)

## 2011-03-01 LAB — MRSA PCR SCREENING: MRSA by PCR: NEGATIVE

## 2011-03-01 LAB — POCT CARDIAC MARKERS
CKMB, poc: <1 ng/mL — ABNORMAL LOW (ref 1.0–8.0)
Myoglobin, poc: 55.2 ng/mL (ref 12–200)

## 2011-03-01 LAB — TSH: TSH: 3.414 u[IU]/mL (ref 0.350–4.500)

## 2011-03-01 LAB — LIPID PANEL
Cholesterol: 154 mg/dL (ref 0–200)
HDL: 37 mg/dL — ABNORMAL LOW (ref >39)
LDL Cholesterol: 82 mg/dL (ref 0–99)
Total CHOL/HDL Ratio: 4.2 RATIO
Triglycerides: 173 mg/dL — ABNORMAL HIGH (ref <150)
VLDL: 35 mg/dL (ref 0–40)

## 2011-03-01 LAB — APTT: aPTT: 38 seconds — ABNORMAL HIGH (ref 24–37)

## 2011-03-01 LAB — CK TOTAL AND CKMB (NOT AT ARMC): Total CK: 73 U/L (ref 7–232)

## 2011-03-01 LAB — HIGH SENSITIVITY CRP: CRP, High Sensitivity: 12.8 mg/L — ABNORMAL HIGH

## 2011-03-02 ENCOUNTER — Encounter: Payer: Self-pay | Admitting: Cardiology

## 2011-03-09 ENCOUNTER — Encounter: Payer: Self-pay | Admitting: Cardiovascular Disease

## 2011-03-11 ENCOUNTER — Encounter: Payer: Self-pay | Admitting: Cardiovascular Disease

## 2011-03-11 ENCOUNTER — Ambulatory Visit (INDEPENDENT_AMBULATORY_CARE_PROVIDER_SITE_OTHER): Payer: BC Managed Care – PPO | Admitting: Cardiovascular Disease

## 2011-03-11 DIAGNOSIS — I10 Essential (primary) hypertension: Secondary | ICD-10-CM

## 2011-03-11 DIAGNOSIS — E785 Hyperlipidemia, unspecified: Secondary | ICD-10-CM

## 2011-03-11 DIAGNOSIS — I2510 Atherosclerotic heart disease of native coronary artery without angina pectoris: Secondary | ICD-10-CM

## 2011-03-11 NOTE — Assessment & Plan Note (Addendum)
The patient is stable without angina. He is tolerating his medical program without problems at present. Recommend continuation of aspirin and Ticagrelor.  He is also tolerating the beta blocker and statin drug. He was encouraged to continue with his exercise program after cardiac rehabilitation is completed.  Will check an exercise treadmill stress test in 6 months.

## 2011-03-11 NOTE — Patient Instructions (Signed)
Your physician has requested that you have an exercise tolerance test in 6 MONTHS. For further information please visit [email or web address] Please also follow instruction sheet, as given.  You can take all of your medications the day of test.   Your physician recommends that you continue on your current medications as directed. Please refer to the Current Medication list given to you today.   Your physician recommends that you return for a FASTING lipid and liver profile in 6 MONTHS, nothing to eat or drink after midnight.

## 2011-03-11 NOTE — Progress Notes (Signed)
HPI:  [name] Gross is a 50 yo male who was admitted to [facility] on 12/04/2010 with an acute inferior STEMI. He was treated with a BMS to the RCA which was occluded in the mid vessel. Post intervention he developed recurrent chest pain with ST elevation. He underwent emergent relook cath which demonstrated acute stent thrombosis and he was treated with a second BMS with adjunctive thrombectomy. EF was low at 45% with inferior dyskinesis on cath. Echo 12/06/2010 demonstrated an EF 55-65%. The patient developed a rash on effient and his antiplatelet regimen has been changed to ASA and brilinta. He presents today for followup evaluation.  The patient was having a difficult time with panic attacks.  He has been treated with Lexapro and is doing much better. His overall sense of well-being has improved. He is having no further chest pain or dyspnea. His tachycardia palpitations have also resolved. The patient is participating in cardiac rehabilitation and is having no exertional symptoms.  He continues to have a rash that comes and goes but he states it is not bothering him enough to go see a dermatologist.   Outpatient Encounter Prescriptions as of 03/11/2011  Medication Sig Dispense Refill  . ALPRAZolam (XANAX) 0.5 MG tablet Take 0.5 mg by mouth at bedtime as needed.        Marland Kitchen aspirin 81 MG tablet Take 81 mg by mouth daily.        Marland Kitchen atorvastatin (LIPITOR) 80 MG tablet Take 80 mg by mouth daily.        Marland Kitchen escitalopram (LEXAPRO) 10 MG tablet Take 10 mg by mouth daily.        . famotidine (PEPCID) 20 MG tablet Take 20 mg by mouth 2 (two) times daily.        . metoprolol succinate (TOPROL-XL) 25 MG 24 hr tablet 1/2 tab po qd       . nitroGLYCERIN (NITROSTAT) 0.4 MG SL tablet Place 0.4 mg under the tongue every 5 (five) minutes as needed.        . tadalafil (CIALIS) 20 MG tablet Take 20 mg by mouth daily as needed.        . Ticagrelor (BRILINTA) 90 MG TABS Take 90 mg by mouth daily.        . TraMADol HCl 50 MG  TBSO Take by mouth. 1 tab every 6 hrs         Allergies  Allergen Reactions  . Amoxicillin     Past Medical History  Diagnosis Date  . CAD (coronary artery disease)     s/p INF STEMI 12/04/2010: treated with BMS to RCA c/b acute stent thrombosis at completion of  primary PCI ...above tx with second BMS with adjunctive thrombectomy residual CAD at cath 12/04/2010: pRCA 40-50%; LAD and CFX ok; EF 45% Echo 12/06/2010: Ef 55-65%; ?HK inferior wall; mild MR  . Glucose intolerance (impaired glucose tolerance)   . Asthma     mild intermittent  . GERD (gastroesophageal reflux disease)   . HTN (hypertension)   . Obesity   . Prostate cancer   . Anxiety   . Allergic rhinitis   . Osteoarthritis   . Erectile dysfunction   . OSA (obstructive sleep apnea)     mild, no CPAP per pt preference    ROS: Negative except as per HPI  BP 120/72  Pulse 68  Resp 18  Wt 155 lb (70.308 kg)  PHYSICAL EXAM: Pt is alert and oriented, NAD HEENT: normal Neck: JVP -  normal, carotids 2+= without bruits Lungs: CTA bilaterally CV: RRR without murmur or gallop Abd: soft, NT, Positive BS, no hepatomegaly Ext: no C/C/E, distal pulses intact and equal Skin: warm/dry no rash  ASSESSMENT AND PLAN:

## 2011-03-11 NOTE — Assessment & Plan Note (Addendum)
Lipids were reviewed and he is able with an LDL less than 70. Continue atorvastatin.

## 2011-03-11 NOTE — Assessment & Plan Note (Signed)
Blood pressure is controlled. Continue current medical regimen.

## 2011-03-29 ENCOUNTER — Telehealth: Payer: Self-pay | Admitting: Cardiovascular Disease

## 2011-03-29 NOTE — Telephone Encounter (Signed)
Pt states that he is only taking one of his Blood thinner and was suppose to take 2. He is not sure if he should be taking 1 or 2. Please call pt.

## 2011-03-29 NOTE — Telephone Encounter (Signed)
He should be taking Brinlinta 90 mg twice daily. I called and spoke with him - he understands instructions to take twice daily.

## 2011-03-29 NOTE — Telephone Encounter (Signed)
Called patient back. He states that he has been taking Brilinta 90mg  every day 1 time per day since December. He was at a rehab class and the nurse questioned if he should be taking Brilinta 2 times per day. Advised him to stay on the medication 1 time per day and will as Dr.Cooper on 4/12 and call him back. Patient agreed with this plan.

## 2011-03-30 NOTE — Telephone Encounter (Signed)
Medication list updated.

## 2011-03-31 ENCOUNTER — Other Ambulatory Visit: Payer: Self-pay

## 2011-03-31 DIAGNOSIS — F419 Anxiety disorder, unspecified: Secondary | ICD-10-CM

## 2011-03-31 MED ORDER — ALPRAZOLAM 0.5 MG PO TABS: 0.5000 mg | ORAL_TABLET | Freq: Every evening | ORAL | Status: DC | PRN

## 2011-03-31 NOTE — Telephone Encounter (Signed)
Faxed hardcopy to pharmacy. 

## 2011-03-31 NOTE — Telephone Encounter (Signed)
Pharmacy requesting refill on Alprazolam 0.5 mg. Last refill 09/18/10 #30 with 3 refills and last OV 01/19/11.

## 2011-04-15 ENCOUNTER — Telehealth: Payer: Self-pay | Admitting: Cardiovascular Disease

## 2011-04-15 NOTE — Telephone Encounter (Signed)
Pt wife states pt has been not feeling well, pt took his blood pressure twice today 170/120. Pt went to the hospital they check his blood pressure 140/90.    Pt wife states pt is also going to have dental work done pt wife wants to know if pt needs to take meds before dental work.

## 2011-04-15 NOTE — Telephone Encounter (Signed)
I spoke with the pt and he had an EMT check his BP this morning because he was not feeling well --BP 170/120.  The pt then went to [facility] and they checked his BP and it was 140/90.   Since that time the pt has rechecked his BP twice 134/85 and 127/91.  All of these BP readings were after the pt's morning medications. I instructed the pt that we would like for him to start monitoring his BP a few times per week and keep a record.  If BP is consistently running above 140/90 then the pt will call our office.  Pt agreed with plan.  I also asked the pt about needing dental work and he said that he was not aware of this information. The pt will call back if his wife has scheduled him for dental work.

## 2011-04-19 ENCOUNTER — Other Ambulatory Visit: Payer: Self-pay | Admitting: Internal Medicine

## 2011-04-19 MED ORDER — TRAMADOL HCL 50 MG PO TBSO: 1.0000 | ORAL_TABLET | Freq: Two times a day (BID) | ORAL | Status: DC | PRN

## 2011-04-19 NOTE — Telephone Encounter (Signed)
Done per emr 

## 2011-05-04 NOTE — Op Note (Signed)
[name] Gross, [name] Gross                  ACCOUNT NO.:  [id number]   MEDICAL RECORD NO.:  [id number]          PATIENT TYPE:  AMB   LOCATION:  DSC                          FACILITY:  [facility]   PHYSICIAN:  Cindee Salt, M.D.       DATE OF BIRTH:  22-Oct-1961   DATE OF PROCEDURE:  01/03/2009  DATE OF DISCHARGE:                               OPERATIVE REPORT   PREOPERATIVE DIAGNOSIS:  Medial epicondylitis, right elbow.   POSTOPERATIVE DIAGNOSIS:  Medial epicondylitis, right elbow.   OPERATION:  Repair of medial epicondylitis, right elbow.   SURGEON:  Cindee Salt, MD   ANESTHESIA:  Axillary block.   ANESTHESIOLOGIST:  Zenon Mayo, MD   HISTORY:  The patient is a 50 year old male with a year's plus history  of pain over the medial side of his elbow.  EMG nerve conductions are  negative for ulnar neuropathy at the elbow.  This has not responded to  conservative treatment.  The MRI reveals that he has avulsion of the  medial epicondylar structures.  He is desirous of attempted repair of  this.  He is aware of risks and complications including infection,  recurrence, injury to arteries, nerves, or tendons, complete relief of  symptoms, and dystrophy.  In the preoperative area, the patient was  seen.  The extremity was marked by both the patient and surgeon.  Antibiotics given.   PROCEDURE:  The patient was brought to the operating room, where an  axillary block was carried out without difficulty and under the  direction of the Dr. Sampson Goon.  He was prepped using DuraPrep in the  supine position with the right arm free.  Time-out was taken.  Following  this, the limb was exsanguinated with an Esmarch bandage.  Tourniquet  placed high and the arm was inflated to 250 mmHg.  An incision was made  over the medial epicondyle of the elbow, carried down through the  subcutaneous tissue.  The posterior branches of the medial antebrachial  cutaneous nerve of the forearm were identified.  The  ulnar nerve was  identified.  An incision was then made into the origin of the flexor  pronator muscle mass.  This immediately encountered an area of  angiofibrosis with no significant healing.  This area was opened.  This  was debrided with a rongeur.  The epicondyle was roughened with a  rongeur down to bleeding bone.  The wound was copiously irrigated with  saline.  The musculature was then repaired back down to the posterior  fascia after releasing the ulnar nerve with figure-of-eight 2-0  Mersilene sutures, some were down with modified Bunnell.  This firmly  brought the medial epicondylar flexor pronator mass back down on to the  medial condyle.  The subcutaneous tissue was closed with interrupted 4-0  Vicryl sutures.  The skin was then closed with interrupted 4-0 Vicryl  Rapide sutures.  A sterile compressive dressing and long-arm splint  applied with the wrist in a mildly flexed position of approximately 20  degrees was applied in the elbow and 90  degrees of flexion.  On deflation of the tourniquet, all fingers  immediately pinked.  He was taken to the recovery room for observation  in satisfactory condition.  He will be discharged home to return to the  [facility] in 1 week time, on Vicodin.           ______________________________  Cindee Salt, M.D.     GK/MEDQ  D:  01/03/2009  T:  01/04/2009  Job:  [id number]   cc:   Tia Alert, MD

## 2011-05-07 NOTE — Op Note (Signed)
Gross, [name]                  ACCOUNT NO.:  [id number]   MEDICAL RECORD NO.:  [id number]          PATIENT TYPE:  INP   LOCATION:  X007                         FACILITY:  [facility]   PHYSICIAN:  Excell Seltzer. Annabell Howells, M.D.    DATE OF BIRTH:  Apr 25, 1961   DATE OF PROCEDURE:  02/16/2007  DATE OF DISCHARGE:                               OPERATIVE REPORT   PROCEDURE:  1. Right spermatocelectomy.  2. Robotic radical prostatectomy with pelvic lymph node dissection.   PREOPERATIVE DIAGNOSIS:  1. Right spermatocele.  2. 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   POSTOPERATIVE DIAGNOSIS:  1. Right spermatocele.  2. 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   SURGEON:  Excell Seltzer. Annabell Howells, M.D.   ASSISTANT:  Heloise Purpura, M.D.   ANESTHESIA:  General.   SPECIMEN:  1. Right spermatocele sac.  2. Prostate and seminal vesicles.  3. Right and left pelvic lymph nodes.   DRAINS:  20-French Coude Foley catheter and a Blake drain.   BLOOD LOSS:  Approximately 100 mL.   COMPLICATIONS:  None.   INDICATIONS:  [name] Gross is a 50 year old white male who was found to have  an elevated PSA of 5.5 and a nodule in the right prostate. He underwent  biopsy that revealed Gleason 6 adenocarcinoma of the prostate involving  the right base with focal perineural invasion, Gleason 7, 3+4, involving  30% of the right mid prostate, and Gleason 7, 3+4, involving 10% of the  right apical biopsies. He elected radical robotic prostatectomy with the  understanding that we would not do a right sided nerve spare because of  the extent of his disease.   FINDINGS AND PROCEDURE:  The patient received routine preoperative prep  and was given IV Ancef.  He was taken to the operating room where a  general anesthetic was induced.  His abdomen and genitalia were clipped.  He was placed in the low lithotomy position.  A red rubber rectal  catheter was placed.  He had PAS hose on.  His abdomen and genitalia  were prepped  with Betadine solution and he was placed in steep  Trendelenburg position.  He was draped with towels, lithotomy drape,  OpSite, and a Foley catheter was inserted.  The balloon was filled with  30 mL of sterile fluid.   The initial procedure performed was the right spermatocelectomy.  A  right oblique anterior scrotal incision was made. The testicle was  delivered from the wound. The tunica vaginalis was entered exposing the  testicle and spermatocele which involved the tail of the epididymis.  The spermatocele was dissected off the testicle and removed along with a  portion of the epididymis. The epididymis was divided and tied with 3-0  chromic sutures.  The epididymal vessels were tied with 3-0 chromic  sutures, as well.  The testicular surface was oversewn with a running 3-  0 chromic stitch.  The testicle was returned to the scrotum.  Once  hemostasis was assured, the wound was closed with a running 3-0 chromic  on the dartos fascia followed by interrupted  vertical mattress 3-0  chromics on the skin.   At this point, we turned our attention to the prostatectomy.  The camera  incision was marked 18 cm superior to the pubis just to the left side of  the umbilicus.  An incision was made with the knife.  This was carried  down through the subcutaneous fat and anterior rectus fascia with the  Bovie.  The rectus muscle was spread and the posterior sheath was nicked  with the knife and a hemostat was then passed into the peritoneal cavity  followed by a finger to enlarge the opening and insure there were no  adhesions. A 12-French trocar was then placed and the incision was  secured with 0 Vicryl sutures.   At this point, the abdomen was insufflated and additional port sites  were marked for the two left 8 mm robot ports in the standard  configuration followed by the right 8 mm robot port, a right 12 mm and 5  mm port in the standard configuration.  The camera was used to place the   ports under direct vision.  The port sites were infiltrated with  lidocaine prior to incision and port placement.  Once all ports were  placed, the robot was moved onto the field and the bladder was filled  with approximately 200 mL of sterile fluid. Using a [place]  and the cautery scissors, the peritoneum was incised anteriorly. The  obliterated umbilical arteries were divided and the bladder was then  reflected down off the anterior abdominal wall exposing the anterior  surface of the prostate.  The bladder was drained once sufficient  exposure had been obtained. Once the prostate had been expose, the third  arm was used to provide cephalad traction on the bladder and the  prostate was freed up laterally on each side by incising the endopelvic  fascia and reflecting the levators off the lateral aspects of the  prostate.  This was carried from base out to apex. The puboprostatic  ligaments were taken down exposing the dorsal vein complex. The dorsal  vein complex was then engaged with an endo-GIA stapler and divided.   We then turned our attention to the bladder neck which was incised  exposing the Foley catheter which was then brought into the wound and  used to provide anterior traction on the prostate.  The posterior  bladder neck was then divided.  This dissection was carried down to the  ampulla of the vas and the seminal vesicles which were then dissected  out using cautery dissection.  Once the ampulla of the vas were divided  and then the seminal vesicles were dissected out with great care being  taken to achieve hemostasis at the seminal vesicle artery at the tip of  the seminal vesicle. The seminal vesicles were then retracted anteriorly  with the third robot arm and Denonvilliers' fascia was incised allowing  creation of a plane between the prostate and the rectum. This was done with the PK dissector.  Once this dissection had been completed, the  left nerve bundle  was developed by incising the fascia over the left  anterior prostate and reflecting it off of the lateral aspect of the  prostate sufficiently to free the neurovascular bundle. The left pedicle  was then taken down using the [place] and cautery scissors.  The  pedicle was sequentially clipped and divided until it had been  completely controlled.  Additional dissection of the apical prostate was  performed  to free up the neurovascular bundle. The right pedicle was  then taken down in a similar fashion with the exception that no nerve  spare was performed allowing a wider margin around the involved  prostate.   Once the dissection had been carried out to the apex, we turned our  attention to the dorsal vein complex which was then completely divided  down to the urethra.  The urethra was then sharply opened with the  scissors exposing the Foley. The Foley was then withdrawn and the  posterior urethral wall was divided. Some additional apical attachments  were taken down and the prostate was then moved out of the field. At  this point, inspection revealed a preserved neurovascular bundle on the  left and a wide dissection on the right. The pelvis was irrigated and  the rectum was insufflated, no air was noted to bubble. The pelvis was  then drained.  Hemostasis was felt to be adequate.   We then turned our attention to the lymph node dissection.  The left  iliac vein was identified.  The fascia was opened and a plane was  developed between the vein and the lymphatic material.  This was  developed out to the pelvic sidewall. The obturator nerve was identified  and a packet was isolated from the nerve. The caudad end of the packet  was divided between clips.  The packet was developed more completely  down to the bifurcation of the iliacs and controlled with clips and  removed.  We then turned our attention to the right node dissection  which was performed in an identical fashion.  Once  hemostasis was  assured in the area of the node dissection, we turned our attention back  to the anastomosis.   A 3-0 Vicryl on an SH needle was passed and a bite was placed through  the bladder neck at 12 o'clock outside-in and then inside-out at 12  o'clock on the urethra.  A slip knot was used to reapproximate the  urethra and bladder neck.  The slip knot was then tied and trimmed.  We  then brought a 4-0 Monocryl onto the field and a running anastomosis was  performed between the bladder neck and the urethra.  Once the  anastomosis had been completed and the suture tied, the final Foley  catheter was inserted and the bladder was irrigated.  There was some  minor extravasation posteriorly that was controlled with traction and  felt to require no additional sutures.  The sutures were trimmed and the  needles were removed.   At this point, a round Blake drain was placed through the fourth arm port and positioned in the pelvis.  This was secured to the abdominal  wall with a 2-0 nylon suture.  The robot was then moved away from the  field.  The entrapment sac was brought through the camera port and the  prostate was placed within the entrapment sac.  The 12 mm right port was  then closed using a needle passer and 2-0 Vicryl.  This was tied and  secured. The remaining trocars were removed under direct observation, no  bleeding was noted.  Finally, the camera port was removed.  The camera  port was enlarged allowing removal of the specimen.  The lymph nodes had  been removed separately.  Once the specimen had been removed, the camera  port fascia was closed using a running 0 Vicryl.  The OpSite was  removed.  The incisions were  all infiltrated with 0.25% Marcaine and  closed with skin clips.  The Foley catheter was then placed on light  traction and secured to straight drainage.  Dressings were applied to  the wounds.   The patient was taken down from the lithotomy position, his  anesthetic  was reversed, he was moved to the recovery room in stable condition.  There were no complications.  The spermatocele sac, the right and left  pelvic lymph nodes, and the prostate seminal vesicles were sent to the  path lab for evaluation.      Excell Seltzer. Annabell Howells, M.D.  Electronically Signed     JJW/MEDQ  D:  02/16/2007  T:  02/16/2007  Job:  [id number]   cc:   Corwin Levins, MD  [address]  [place]  [place] [address]

## 2011-05-20 ENCOUNTER — Encounter: Payer: Self-pay | Admitting: Internal Medicine

## 2011-05-20 ENCOUNTER — Ambulatory Visit (INDEPENDENT_AMBULATORY_CARE_PROVIDER_SITE_OTHER): Payer: BC Managed Care – PPO | Admitting: Internal Medicine

## 2011-05-20 ENCOUNTER — Other Ambulatory Visit (INDEPENDENT_AMBULATORY_CARE_PROVIDER_SITE_OTHER): Payer: BC Managed Care – PPO

## 2011-05-20 ENCOUNTER — Ambulatory Visit (INDEPENDENT_AMBULATORY_CARE_PROVIDER_SITE_OTHER)
Admission: RE | Admit: 2011-05-20 | Discharge: 2011-05-20 | Disposition: A | Discharge status: Home / Self Care | Payer: BC Managed Care – PPO | Source: Ambulatory Visit | Attending: Internal Medicine | Admitting: Internal Medicine

## 2011-05-20 VITALS — BP 110/82 | HR 65 | Temp 98.6°F | Ht 64.0 in | Wt 160.5 lb

## 2011-05-20 DIAGNOSIS — R7309 Other abnormal glucose: Secondary | ICD-10-CM

## 2011-05-20 DIAGNOSIS — R748 Abnormal levels of other serum enzymes: Secondary | ICD-10-CM | POA: Insufficient documentation

## 2011-05-20 DIAGNOSIS — R509 Fever, unspecified: Secondary | ICD-10-CM

## 2011-05-20 DIAGNOSIS — Z0001 Encounter for general adult medical examination with abnormal findings: Secondary | ICD-10-CM | POA: Insufficient documentation

## 2011-05-20 DIAGNOSIS — R7302 Impaired glucose tolerance (oral): Secondary | ICD-10-CM

## 2011-05-20 DIAGNOSIS — F411 Generalized anxiety disorder: Secondary | ICD-10-CM

## 2011-05-20 DIAGNOSIS — E119 Type 2 diabetes mellitus without complications: Secondary | ICD-10-CM | POA: Insufficient documentation

## 2011-05-20 DIAGNOSIS — E1140 Type 2 diabetes mellitus with diabetic neuropathy, unspecified: Secondary | ICD-10-CM | POA: Insufficient documentation

## 2011-05-20 DIAGNOSIS — Z0000 Encounter for general adult medical examination without abnormal findings: Secondary | ICD-10-CM

## 2011-05-20 LAB — CBC WITH DIFFERENTIAL/PLATELET
Basophils Relative: 1.3 % (ref 0.0–3.0)
Eosinophils Absolute: 0.1 10*3/uL (ref 0.0–0.7)
Eosinophils Relative: 1.8 % (ref 0.0–5.0)
HCT: 43.3 % (ref 39.0–52.0)
Hemoglobin: 14.9 g/dL (ref 13.0–17.0)
MCHC: 34.4 g/dL (ref 30.0–36.0)
MCV: 88.7 fl (ref 78.0–100.0)
Monocytes Absolute: 0.8 10*3/uL (ref 0.1–1.0)
Neutro Abs: 4 10*3/uL (ref 1.4–7.7)
Neutrophils Relative %: 63.5 % (ref 43.0–77.0)
RBC: 4.89 Mil/uL (ref 4.22–5.81)
WBC: 6.4 10*3/uL (ref 4.5–10.5)

## 2011-05-20 LAB — BASIC METABOLIC PANEL
BUN: 10 mg/dL (ref 6–23)
Chloride: 105 mEq/L (ref 96–112)
Glucose, Bld: 101 mg/dL — ABNORMAL HIGH (ref 70–99)
Potassium: 4.4 mEq/L (ref 3.5–5.1)

## 2011-05-20 LAB — URINALYSIS, ROUTINE W REFLEX MICROSCOPIC
Bilirubin Urine: NEGATIVE
Ketones, ur: NEGATIVE
Total Protein, Urine: NEGATIVE
Urine Glucose: NEGATIVE
pH: 6.5 (ref 5.0–8.0)

## 2011-05-20 LAB — HEPATIC FUNCTION PANEL
ALT: 68 U/L — ABNORMAL HIGH (ref 0–53)
Albumin: 4.3 g/dL (ref 3.5–5.2)
Bilirubin, Direct: 0.2 mg/dL (ref 0.0–0.3)
Total Protein: 6.8 g/dL (ref 6.0–8.3)

## 2011-05-20 MED ORDER — DOXYCYCLINE HYCLATE 100 MG PO TABS: 100.0000 mg | ORAL_TABLET | Freq: Two times a day (BID) | ORAL | Status: AC

## 2011-05-20 NOTE — Assessment & Plan Note (Signed)
Asympt, .cont diet, wt control,   Lab Results  Component Value Date   HGBA1C  Value: 5.8 (NOTE)                                                                       According to the ADA Clinical Practice Recommendations for 2011, when HbA1c is used as a screening test:   >=6.5%   Diagnostic of Diabetes Mellitus           (if abnormal result  is confirmed)  5.7-6.4%   Increased risk of developing Diabetes Mellitus  References:Diagnosis and Classification of Diabetes Mellitus,Diabetes Care,2011,34(Suppl 1):S62-S69 and Standards of Medical Care in         Diabetes - 2011,Diabetes Care,2011,34  (Suppl 1):S11-S61.* 12/04/2010

## 2011-05-20 NOTE — Assessment & Plan Note (Signed)
stable overall by hx and exam, most recent data reviewed with pt, and pt to continue medical treatment as before  Lab Results  Component Value Date   WBC 6.4 05/20/2011   HGB 14.9 05/20/2011   HCT 43.3 05/20/2011   PLT 264.0 05/20/2011   CHOL 110 02/12/2011   TRIG 76.0 02/12/2011   HDL 45.40 02/12/2011   LDLDIRECT 107.4 09/01/2007   ALT 68* 05/20/2011   AST 39* 05/20/2011   NA 144 05/20/2011   K 4.4 05/20/2011   CL 105 05/20/2011   CREATININE 1.0 05/20/2011   BUN 10 05/20/2011   CO2 32 05/20/2011   TSH 3.414 12/04/2010   PSA 0.01* 09/17/2010   INR 1.00 12/22/2010   HGBA1C  Value: 5.8 (NOTE)                                                                       According to the ADA Clinical Practice Recommendations for 2011, when HbA1c is used as a screening test:   >=6.5%   Diagnostic of Diabetes Mellitus           (if abnormal result  is confirmed)  5.7-6.4%   Increased risk of developing Diabetes Mellitus  References:Diagnosis and Classification of Diabetes Mellitus,Diabetes Care,2011,34(Suppl 1):S62-S69 and Standards of Medical Care in         Diabetes - 2011,Diabetes Care,2011,34  (Suppl 1):S11-S61.* 12/04/2010

## 2011-05-20 NOTE — Assessment & Plan Note (Signed)
Exam benign today except for insect bite to right deltoid area without significant cellulitis, and hx earlier this wk of ? AGE like symptoms with crampy pain and watery diarrhea;  I suspect primarily viral AGE, but cant r/o other  Such as tickborne illness or even endocarditis with his CV hx - for RMSF serology, doxy course, blood cx, and labs;  to f/u any worsening symptoms or concerns

## 2011-05-20 NOTE — Patient Instructions (Signed)
Take all new medications as prescribed Continue all other medications as before Please go to XRAY in the Basement for the x-ray test Please go to LAB in the Basement for the blood and/or urine tests to be done today Please call the phone number [phone] (the PhoneTree System) for results of testing in 2-3 days;  When calling, simply dial the number, and when prompted enter the MRN number above (the Medical Record Number) and the # key, then the message should start. Please call or return with any further symptoms or concerns as needed Please return in 6 mo with Lab testing done 3-5 days before

## 2011-05-20 NOTE — Progress Notes (Signed)
Subjective:    Patient ID: [name] Gross, male    DOB: 07-16-1961, 50 y.o.   MRN: [id number]  HPI Here with wife for acute visit;  C/o tick bite to right deltoid area several days ago,  And now 2-3 days onset low grade temp 100.5, chills, HA, myalgias and arthralgias, as well as mild crampy abd pains and several episodes loose watery stools.  No overt rash.  Pt denies chest pain, increased sob or doe, wheezing, orthopnea, PND, increased LE swelling, palpitations, dizziness or syncope.  Pt denies new neurological symptoms such as new headache, or facial or extremity weakness or numbness   Pt denies polydipsia, polyuria  Pt states overall good compliance with meds.  Denies worsening depressive symptoms, suicidal ideation, or panic, though has ongoing anxiety, not increased recently.  Past Medical History  Diagnosis Date  . CAD (coronary artery disease)     s/p INF STEMI 12/04/2010: treated with BMS to RCA c/b acute stent thrombosis at completion of  primary PCI ...above tx with second BMS with adjunctive thrombectomy residual CAD at cath 12/04/2010: pRCA 40-50%; LAD and CFX ok; EF 45% Echo 12/06/2010: Ef 55-65%; ?HK inferior wall; mild MR  . Glucose intolerance (impaired glucose tolerance)   . Asthma     mild intermittent  . GERD (gastroesophageal reflux disease)   . HTN (hypertension)   . Obesity   . Prostate cancer   . Anxiety   . Allergic rhinitis   . Osteoarthritis   . Erectile dysfunction   . OSA (obstructive sleep apnea)     mild, no CPAP per pt preference   Past Surgical History  Procedure Date  . Coronary stent placement     Stenting of the right coronary artery......... Successful percutaneous coronary intervention with  adjunctive thrombectomy for treatment of acute stent thrombosis and   reocclusion of the right coronary artery after treatment earlier today  of an acute myocardial infarction  . Nasal sinus surgery   . Knee surgery   . Spermatocelectomy   . Wrist surgery   .  Elbow surgery     reports that he has quit smoking. His smoking use included Cigarettes. He does not have any smokeless tobacco history on file. He reports that he does not drink alcohol or use illicit drugs. family history includes Coronary artery disease in his father; Diabetes in his father; and Hip fracture in his father. Allergies  Allergen Reactions  . Amoxicillin    Current Outpatient Prescriptions on File Prior to Visit  Medication Sig Dispense Refill  . ALPRAZolam (XANAX) 0.5 MG tablet Take 1 tablet (0.5 mg total) by mouth at bedtime as needed.  30 tablet  3  . aspirin 81 MG tablet Take 81 mg by mouth daily.        Marland Kitchen atorvastatin (LIPITOR) 80 MG tablet Take 80 mg by mouth daily.        Marland Kitchen escitalopram (LEXAPRO) 10 MG tablet Take 10 mg by mouth daily.        . famotidine (PEPCID) 20 MG tablet Take 20 mg by mouth 2 (two) times daily.        . metoprolol succinate (TOPROL-XL) 25 MG 24 hr tablet 1/2 tab po qd       . nitroGLYCERIN (NITROSTAT) 0.4 MG SL tablet Place 0.4 mg under the tongue every 5 (five) minutes as needed.        . Ticagrelor (BRILINTA) 90 MG TABS Take 90 mg by mouth 2 (two) times daily.       Marland Kitchen  TraMADol HCl 50 MG TBSO Take 1 tablet (50 mg total) by mouth 2 (two) times daily as needed.  60 tablet  1  . DISCONTD: tadalafil (CIALIS) 20 MG tablet Take 20 mg by mouth daily as needed.         Review of Systems Review of Systems  Constitutional: Negative for diaphoresis, activity change, appetite change and unexpected weight change.  HENT: Negative for hearing loss, ear pain, facial swelling, mouth sores and neck stiffness.   Eyes: Negative for pain, redness and visual disturbance.  Respiratory: Negative for shortness of breath and wheezing.   Cardiovascular: Negative for chest pain and palpitations.  Gastrointestinal: Negative for, blood in stool, abdominal distention and rectal pain.  Genitourinary: Negative for hematuria, flank pain and decreased urine volume.    Musculoskeletal: Negative for myalgias and joint swelling.  Skin: Negative for color change and wound.  Neurological: Negative for syncope and numbness.  Hematological: Negative for adenopathy.  Psychiatric/Behavioral: Negative for hallucinations, self-injury, decreased concentration and agitation.       Objective:   Physical Exam BP 110/82  Pulse 65  Temp(Src) 98.6 F (37 C) (Oral)  Ht 5\' 4"  (1.626 m)  Wt 160 lb 8 oz (72.802 kg)  BMI 27.55 kg/m2  SpO2 98% Physical Exam  VS noted, mild ill  Constitutional: Pt appears well-developed and well-nourished.  HENT: Head: Normocephalic.  Right Ear: External ear normal.  Left Ear: External ear normal.  Eyes: Conjunctivae and EOM are normal. Pupils are equal, round, and reactive to light.  Neck: Normal range of motion. Neck supple.  Cardiovascular: Normal rate and regular rhythm.   Pulmonary/Chest: Effort normal and breath sounds normal.  Abd:  Soft, NT, non-distended, + BS except for mild mid abd tender, no guarding or rebound Neurological: Pt is alert. No cranial nerve deficit.  Skin: Skin is warm. No erythema. No rash, Tick bite site to right deltoid with mild red, swelling, tedner but no fluctuance, or drainage Psychiatric: Pt behavior is normal. Thought content normal. 2+ nervous MSK: no synovitis       Assessment & Plan:

## 2011-05-20 NOTE — Progress Notes (Signed)
Quick Note:  Voice message left on PhoneTree system - lab is negative, normal or otherwise stable, pt to continue same tx ______ 

## 2011-05-21 ENCOUNTER — Other Ambulatory Visit: Payer: BC Managed Care – PPO

## 2011-05-21 DIAGNOSIS — R945 Abnormal results of liver function studies: Secondary | ICD-10-CM

## 2011-05-24 ENCOUNTER — Other Ambulatory Visit: Payer: Self-pay | Admitting: Internal Medicine

## 2011-05-24 LAB — HEPATITIS PANEL, ACUTE
HCV Ab: NEGATIVE
Hep A IgM: NEGATIVE

## 2011-05-24 NOTE — Progress Notes (Signed)
Quick Note:  Voice message left on PhoneTree system - lab is negative, normal or otherwise stable, pt to continue same tx ______ 

## 2011-05-26 ENCOUNTER — Ambulatory Visit
Admission: RE | Admit: 2011-05-26 | Discharge: 2011-05-26 | Disposition: A | Discharge status: Home / Self Care | Payer: BC Managed Care – PPO | Source: Ambulatory Visit | Attending: Internal Medicine | Admitting: Internal Medicine

## 2011-05-26 DIAGNOSIS — R748 Abnormal levels of other serum enzymes: Secondary | ICD-10-CM

## 2011-06-03 ENCOUNTER — Telehealth: Payer: Self-pay | Admitting: Cardiovascular Disease

## 2011-06-03 NOTE — Telephone Encounter (Signed)
Pt calling re questions for the nurse

## 2011-06-03 NOTE — Telephone Encounter (Signed)
I spoke with the pt and he is complaining of fatigue.  The pt said he "drags" all day and when he sits down he will fall asleep for 2-3 hours at a time.  The pt is currently taking Toprol XL 25mg  one-half tablet daily.  I will forward this information to Dr Excell Seltzer to see if the pt can stop this medication to see if fatigue improves.  The pt did see PCP on 05/20/11 and a TSH was ordered but this has not been drawn at this time.

## 2011-06-04 NOTE — Telephone Encounter (Signed)
I'm ok with Mr Vanwingerden holding Toprol to see if he feels better off of the medication.

## 2011-06-07 NOTE — Telephone Encounter (Signed)
I spoke with the [name] Gross and made him aware of Dr Earmon Phoenix recommendation.  The [name] Gross will stop Toprol XL to see if his fatigue improves.

## 2011-07-08 ENCOUNTER — Other Ambulatory Visit: Payer: Self-pay | Admitting: *Deleted

## 2011-07-08 MED ORDER — ATORVASTATIN CALCIUM 80 MG PO TABS: 80.0000 mg | ORAL_TABLET | Freq: Every day | ORAL | Status: DC

## 2011-07-13 ENCOUNTER — Telehealth: Payer: Self-pay | Admitting: Cardiovascular Disease

## 2011-07-13 NOTE — Telephone Encounter (Signed)
Pt needs his [facility]itor [id number]

## 2011-07-14 ENCOUNTER — Other Ambulatory Visit: Payer: Self-pay | Admitting: Cardiovascular Disease

## 2011-07-14 DIAGNOSIS — E785 Hyperlipidemia, unspecified: Secondary | ICD-10-CM

## 2011-07-14 NOTE — Telephone Encounter (Signed)
**Note De-identified [name] Gross Obfuscation** [place] pt. 

## 2011-07-14 NOTE — Telephone Encounter (Signed)
Per pt call, please return call to pt.  

## 2011-07-15 NOTE — Telephone Encounter (Signed)
Spoke with pt's wife who states pt needs Lipitor refilled.  It looks as if this has been electronically refilled but I called [facility] and they did not receive. I verbally gave order for Atorvastatin 80 mg by mouth daily #30 with 6 refills to [facility] on [address].  Wife aware prescription to be called in.  Wife transferred to schedulers to schedule fasting lab work and treadmill test for pt in September.  Instructions for treadmill verbally given to wife.

## 2011-07-15 NOTE — Telephone Encounter (Signed)
Per pt call, please return call.

## 2011-07-21 ENCOUNTER — Other Ambulatory Visit: Payer: Self-pay | Admitting: *Deleted

## 2011-07-21 DIAGNOSIS — E785 Hyperlipidemia, unspecified: Secondary | ICD-10-CM

## 2011-07-21 MED ORDER — ATORVASTATIN CALCIUM 80 MG PO TABS: 80.0000 mg | ORAL_TABLET | Freq: Every day | ORAL | Status: DC

## 2011-07-30 NOTE — Telephone Encounter (Signed)
Called in patient Rx to pharmacy, pt already had refills on this medication.  Judithe Modest, CMA

## 2011-08-30 ENCOUNTER — Other Ambulatory Visit: Payer: BC Managed Care – PPO | Admitting: *Deleted

## 2011-08-30 ENCOUNTER — Other Ambulatory Visit: Payer: Self-pay | Admitting: Internal Medicine

## 2011-09-06 ENCOUNTER — Other Ambulatory Visit (INDEPENDENT_AMBULATORY_CARE_PROVIDER_SITE_OTHER): Payer: BC Managed Care – PPO | Admitting: *Deleted

## 2011-09-06 DIAGNOSIS — I2510 Atherosclerotic heart disease of native coronary artery without angina pectoris: Secondary | ICD-10-CM

## 2011-09-06 DIAGNOSIS — E785 Hyperlipidemia, unspecified: Secondary | ICD-10-CM

## 2011-09-06 LAB — LIPID PANEL
HDL: 48.7 mg/dL (ref >39.00)
LDL Cholesterol: 37 mg/dL (ref 0–99)
Total CHOL/HDL Ratio: 2
VLDL: 34 mg/dL (ref 0.0–40.0)

## 2011-09-06 LAB — HEPATIC FUNCTION PANEL
Alkaline Phosphatase: 86 U/L (ref 39–117)
Bilirubin, Direct: 0 mg/dL (ref 0.0–0.3)

## 2011-09-07 ENCOUNTER — Telehealth: Payer: Self-pay | Admitting: Cardiovascular Disease

## 2011-09-07 DIAGNOSIS — E785 Hyperlipidemia, unspecified: Secondary | ICD-10-CM

## 2011-09-07 MED ORDER — ATORVASTATIN CALCIUM 20 MG PO TABS: 20.0000 mg | ORAL_TABLET | Freq: Every day | ORAL | Status: DC

## 2011-09-07 NOTE — Telephone Encounter (Signed)
Pt called this AM, when computers were down to speak w/ [name] Gross. Please return pt call to discuss further.

## 2011-09-07 NOTE — Telephone Encounter (Signed)
Spoke with pt and gave him lipid and liver results and Dr. Earmon Phoenix instructions to decrease Lipitor to 20 mg daily and repeat LFT's in 12 weeks.  [facility] [address]  He will come in week of November 29, 2011 for labs.

## 2011-09-07 NOTE — Telephone Encounter (Signed)
Pt rtning your call from yesterday

## 2011-09-10 ENCOUNTER — Encounter: Payer: BC Managed Care – PPO | Admitting: Cardiovascular Disease

## 2011-09-14 ENCOUNTER — Ambulatory Visit (INDEPENDENT_AMBULATORY_CARE_PROVIDER_SITE_OTHER): Payer: BC Managed Care – PPO | Admitting: Cardiovascular Disease

## 2011-09-14 ENCOUNTER — Other Ambulatory Visit: Payer: Self-pay | Admitting: *Deleted

## 2011-09-14 DIAGNOSIS — I2510 Atherosclerotic heart disease of native coronary artery without angina pectoris: Secondary | ICD-10-CM

## 2011-09-14 MED ORDER — FAMOTIDINE 20 MG PO TABS: 20.0000 mg | ORAL_TABLET | Freq: Two times a day (BID) | ORAL | Status: DC

## 2011-09-14 NOTE — Progress Notes (Signed)
Addended by: Iona Coach on: 09/14/2011 03:47 PM   Modules accepted: Orders

## 2011-09-14 NOTE — Progress Notes (Signed)
Exercise Treadmill Test  Pre-Exercise Testing Evaluation Rhythm: normal sinus  Rate: 84   PR:  .16 QRS:  .08  QT:  .35 QTc: .42     Test  Exercise Tolerance Test Ordering MD: Tonny Bollman, MD  Interpreting MD:  Tonny Bollman, MD  Unique Test No: 1  Treadmill:  1  Indication for ETT: post-MI  Contraindication to ETT: No   Stress Modality: exercise - treadmill  Cardiac Imaging Performed: non   Protocol: standard Bruce - maximal  Max BP:  170/93  Max MPHR (bpm):  170 85% MPR (bpm):  145  MPHR obtained (bpm):  164 % MPHR obtained:  95  Reached 85% MPHR (min:sec):  7:09 Total Exercise Time (min-sec):  10:55  Workload in METS:  13.2 Borg Scale: 16  Reason ETT Terminated:  leg cramps    ST Segment Analysis At Rest: normal ST segments - no evidence of significant ST depression With Exercise: no evidence of significant ST depression  Other Information Arrhythmia:  No Angina during ETT:  absent (0) Quality of ETT:  diagnostic  ETT Interpretation:  normal - no evidence of ischemia by ST analysis  Comments: Good exercise tolerance. No angina, ST depression, or arrhythmia with exertion.  Recommendations: Continue current medical program.

## 2011-09-14 NOTE — Patient Instructions (Signed)
Your physician wants you to follow-up in: 6 MONTHS. You will receive a reminder letter in the mail two months in advance. If you don't receive a letter, please call our office to schedule the follow-up appointment.  Your physician recommends that you continue on your current medications as directed. Please refer to the Current Medication list given to you today.  Your physician recommends that you return for a FASTING lipid profile and liver profile 11/30/11.

## 2011-10-05 IMAGING — CR DG CHEST 1V PORT
1 series · 1 of 1 positions shown · non-contrast
Comparison: None.

CLINICAL DATA: Acute coronary syndrome, post cardiac
catheterization

PORTABLE CHEST - 1 VIEW

[AP]
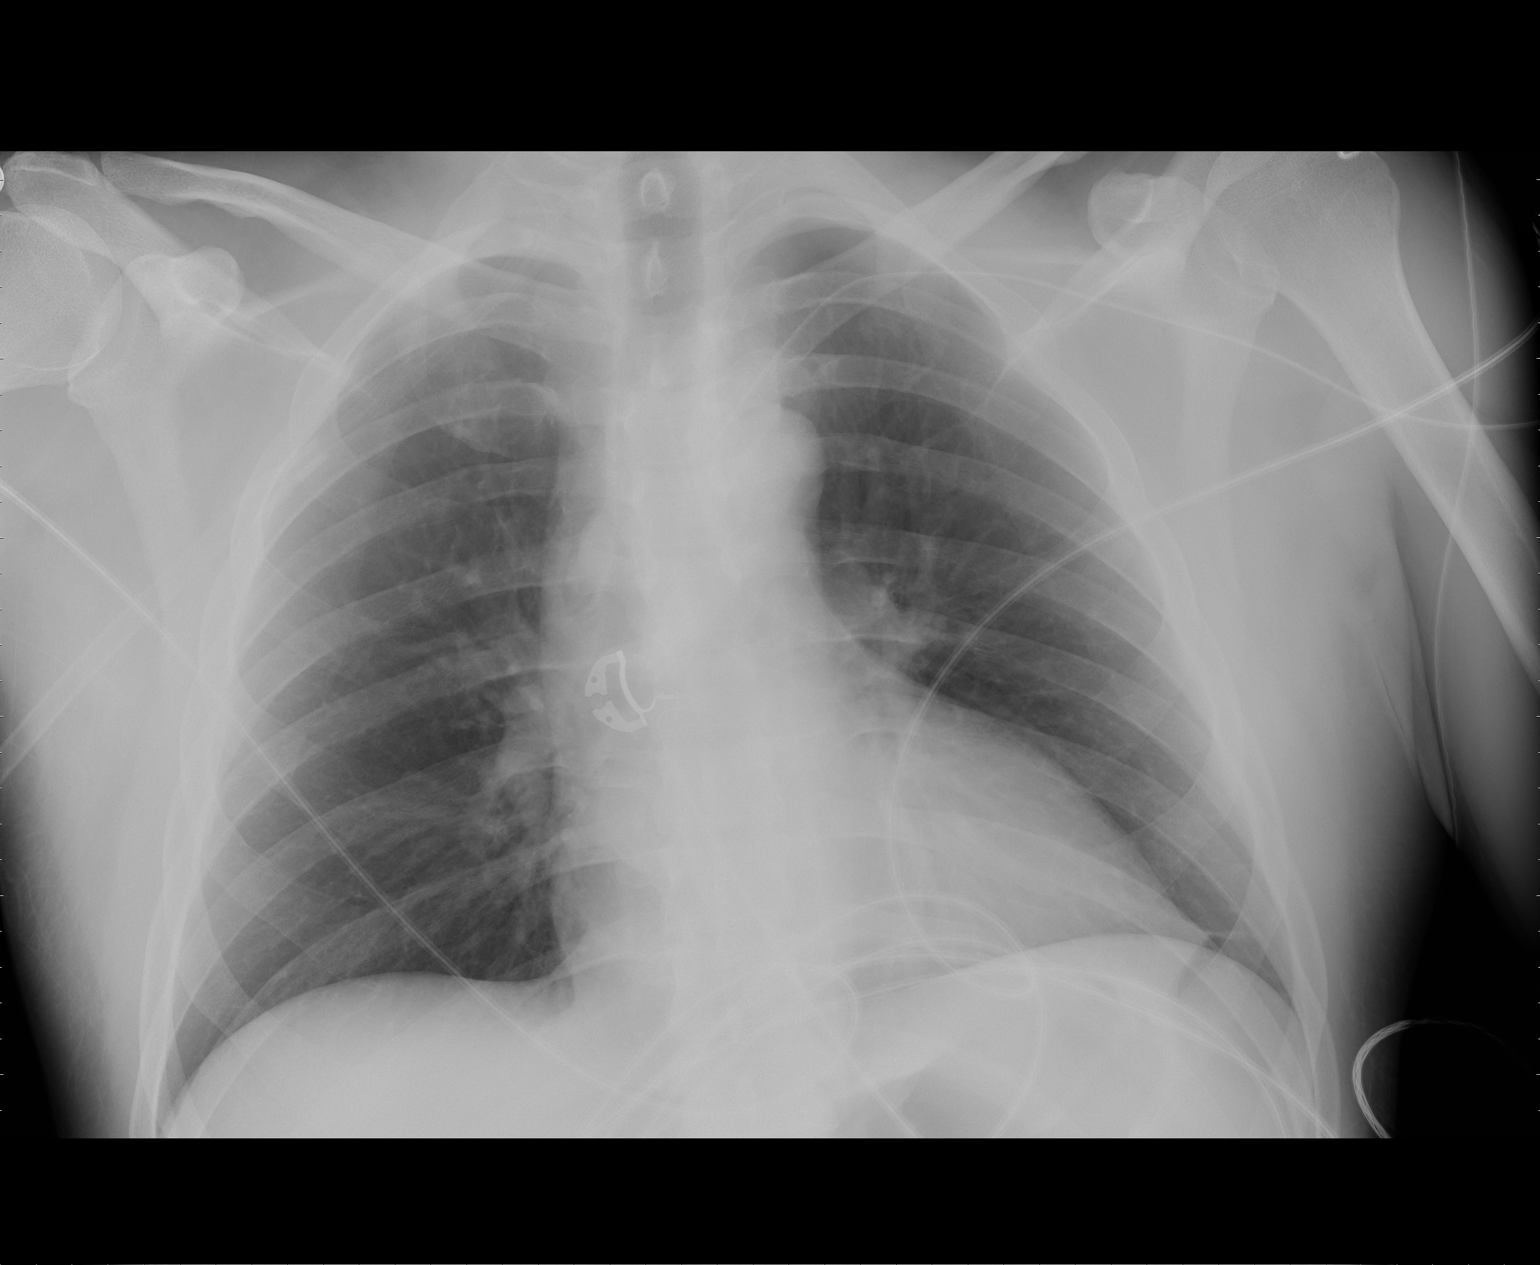

[1 of 1 positions shown; findings below may reference images not displayed]

FINDINGS: The lungs are clear.  Left-sided central venous line is
noted with the tip seen to the left innominate vein.  No
pneumothorax is seen.  Heart size is within normal limits.
IMPRESSION: No active lung disease.  Left-sided central venous catheter is seen
to the left innominate vein.

## 2011-10-07 ENCOUNTER — Telehealth: Payer: Self-pay | Admitting: Cardiovascular Disease

## 2011-10-07 NOTE — Telephone Encounter (Signed)
No pre-med needed. The patient's wife is aware.

## 2011-10-07 NOTE — Telephone Encounter (Signed)
He has dental appt next week. Does he need to be pre medicated please call

## 2011-10-19 ENCOUNTER — Other Ambulatory Visit: Payer: Self-pay | Admitting: Cardiovascular Disease

## 2011-11-09 ENCOUNTER — Other Ambulatory Visit: Payer: Self-pay | Admitting: Internal Medicine

## 2011-11-09 ENCOUNTER — Other Ambulatory Visit (INDEPENDENT_AMBULATORY_CARE_PROVIDER_SITE_OTHER): Payer: BC Managed Care – PPO

## 2011-11-09 DIAGNOSIS — Z0000 Encounter for general adult medical examination without abnormal findings: Secondary | ICD-10-CM

## 2011-11-09 DIAGNOSIS — R509 Fever, unspecified: Secondary | ICD-10-CM

## 2011-11-09 DIAGNOSIS — R7309 Other abnormal glucose: Secondary | ICD-10-CM

## 2011-11-09 DIAGNOSIS — R7302 Impaired glucose tolerance (oral): Secondary | ICD-10-CM

## 2011-11-09 LAB — URINALYSIS, ROUTINE W REFLEX MICROSCOPIC
Hgb urine dipstick: NEGATIVE
Total Protein, Urine: NEGATIVE
Urine Glucose: NEGATIVE
pH: 5.5 (ref 5.0–8.0)

## 2011-11-09 LAB — CBC WITH DIFFERENTIAL/PLATELET
Basophils Relative: 0.8 % (ref 0.0–3.0)
Eosinophils Absolute: 0.4 10*3/uL (ref 0.0–0.7)
Hemoglobin: 16.2 g/dL (ref 13.0–17.0)
Lymphocytes Relative: 25.6 % (ref 12.0–46.0)
MCHC: 34.5 g/dL (ref 30.0–36.0)
MCV: 88.5 fl (ref 78.0–100.0)
Monocytes Absolute: 0.4 10*3/uL (ref 0.1–1.0)
Neutro Abs: 4 10*3/uL (ref 1.4–7.7)
RBC: 5.33 Mil/uL (ref 4.22–5.81)

## 2011-11-09 LAB — PSA: PSA: 0 ng/mL — ABNORMAL LOW (ref 0.10–4.00)

## 2011-11-09 LAB — BASIC METABOLIC PANEL
Calcium: 9.8 mg/dL (ref 8.4–10.5)
Creatinine, Ser: 1.2 mg/dL (ref 0.4–1.5)

## 2011-11-09 LAB — LIPID PANEL
Cholesterol: 169 mg/dL (ref 0–200)
Total CHOL/HDL Ratio: 3
Triglycerides: 223 mg/dL — ABNORMAL HIGH (ref 0.0–149.0)
VLDL: 44.6 mg/dL — ABNORMAL HIGH (ref 0.0–40.0)

## 2011-11-09 LAB — HEPATIC FUNCTION PANEL
Bilirubin, Direct: 0.1 mg/dL (ref 0.0–0.3)
Total Bilirubin: 0.9 mg/dL (ref 0.3–1.2)

## 2011-11-15 LAB — CULTURE, BLOOD (SINGLE): Organism ID, Bacteria: NO GROWTH

## 2011-11-18 ENCOUNTER — Encounter: Payer: Self-pay | Admitting: Internal Medicine

## 2011-11-18 ENCOUNTER — Ambulatory Visit (INDEPENDENT_AMBULATORY_CARE_PROVIDER_SITE_OTHER): Payer: BC Managed Care – PPO | Admitting: Internal Medicine

## 2011-11-18 VITALS — BP 130/82 | HR 75 | Temp 97.3°F | Ht 64.0 in | Wt 170.4 lb

## 2011-11-18 DIAGNOSIS — R21 Rash and other nonspecific skin eruption: Secondary | ICD-10-CM | POA: Insufficient documentation

## 2011-11-18 DIAGNOSIS — Z23 Encounter for immunization: Secondary | ICD-10-CM

## 2011-11-18 DIAGNOSIS — Z0000 Encounter for general adult medical examination without abnormal findings: Secondary | ICD-10-CM

## 2011-11-18 MED ORDER — TRIAMCINOLONE ACETONIDE 0.1 % EX CREA: TOPICAL_CREAM | Freq: Two times a day (BID) | CUTANEOUS | Status: DC

## 2011-11-18 MED ORDER — PREDNISONE 10 MG PO TABS: ORAL_TABLET | ORAL | Status: DC

## 2011-11-18 MED ORDER — METHYLPREDNISOLONE ACETATE PF 80 MG/ML IJ SUSP
120.0000 mg | Freq: Once | INTRAMUSCULAR | Status: AC
  Administered 2011-11-18: 120 mg via INTRAMUSCULAR

## 2011-11-18 MED ORDER — FEXOFENADINE HCL 180 MG PO TABS: 180.0000 mg | ORAL_TABLET | Freq: Every day | ORAL | Status: DC

## 2011-11-18 NOTE — Assessment & Plan Note (Signed)
Overall doing well, age appropriate education and counseling updated, referrals for preventative services and immunizations addressed, dietary and smoking counseling addressed, most recent labs and ECG reviewed.  I have personally reviewed and have noted: 1) the patient's medical and social history 2) The pt's use of alcohol, tobacco, and illicit drugs 3) The patient's current medications and supplements 4) Functional ability including ADL's, fall risk, home safety risk, hearing and visual impairment 5) Diet and physical activities 6) Evidence for depression or mood disorder 7) The patient's height, weight, and BMI have been recorded in the chart I have made referrals, and provided counseling and education based on review of the above Declines colonscoyp at this time, ECG reviewed as per emr

## 2011-11-18 NOTE — Assessment & Plan Note (Signed)
1 yr duration, prob allergic type - cant r/o meds vs other; for depomedrol IM today, predpack pulse, rx for triam cr, allegra, and consider derm referral (declines at this time), Continue all other medications as before

## 2011-11-18 NOTE — Patient Instructions (Addendum)
You had the flu shot today You had the steroid shot today Take all new medications as prescribed - the prednisone, allegra and steroid cream Please call if you want the dermatology referral Please call if you want the colonscopy referral Please keep your appointments with your specialists as you have planned - Dr Excell Seltzer, and Dr Annabell Howells We will fax your labs to Dr Annabell Howells later today Please return in 1 year for your yearly visit, or sooner if needed, with Lab testing done 3-5 days before

## 2011-11-20 ENCOUNTER — Encounter: Payer: Self-pay | Admitting: Internal Medicine

## 2011-11-20 NOTE — Progress Notes (Signed)
Subjective:    Patient ID: [name] Gross, male    DOB: [birth year], 50 y.o.   MRN: [id number]  HPI Here for wellness and f/u;  Overall doing ok;  Pt denies CP, worsening SOB, DOE, wheezing, orthopnea, PND, worsening LE edema, palpitations, dizziness or syncope.  Pt denies neurological change such as new Headache, facial or extremity weakness.  Pt denies polydipsia, polyuria, or low sugar symptoms. Pt states overall good compliance with treatment and medications, good tolerability, and trying to follow lower cholesterol diet.  Pt denies worsening depressive symptoms, suicidal ideation or panic. No fever, wt loss, night sweats, loss of appetite, or other constitutional symptoms.  Pt states good ability with ADL's, low fall risk, home safety reviewed and adequate, no significant changes in hearing or vision, and occasionally active with exercise.  Has mild rash to arms with itch, no pain or drainage Past Medical History  Diagnosis Date  . CAD (coronary artery disease)     s/p INF STEMI 12/04/2010: treated with BMS to RCA c/b acute stent thrombosis at completion of  primary PCI ...above tx with second BMS with adjunctive thrombectomy residual CAD at cath 12/04/2010: pRCA 40-50%; LAD and CFX ok; EF 45% Echo 12/06/2010: Ef 55-65%; ?HK inferior wall; mild MR  . Glucose intolerance (impaired glucose tolerance)   . Asthma     mild intermittent  . GERD (gastroesophageal reflux disease)   . HTN (hypertension)   . Obesity   . Prostate cancer   . Anxiety   . Allergic rhinitis   . Osteoarthritis   . Erectile dysfunction   . OSA (obstructive sleep apnea)     mild, no CPAP per pt preference   Past Surgical History  Procedure Date  . Coronary stent placement     Stenting of the right coronary artery......... Successful percutaneous coronary intervention with  adjunctive thrombectomy for treatment of acute stent thrombosis and   reocclusion of the right coronary artery after treatment earlier today  of an  acute myocardial infarction  . Nasal sinus surgery   . Knee surgery   . Spermatocelectomy   . Wrist surgery   . Elbow surgery     reports that he has quit smoking. His smoking use included Cigarettes. He does not have any smokeless tobacco history on file. He reports that he does not drink alcohol or use illicit drugs. family history includes Coronary artery disease in his father; Diabetes in his father; and Hip fracture in his father. Allergies  Allergen Reactions  . Amoxicillin    Current Outpatient Prescriptions on File Prior to Visit  Medication Sig Dispense Refill  . ALPRAZolam (XANAX) 0.5 MG tablet Take 1 tablet (0.5 mg total) by mouth at bedtime as needed.  30 tablet  3  . aspirin 81 MG tablet Take 81 mg by mouth daily.        Marland Kitchen atorvastatin (LIPITOR) 20 MG tablet Take 1 tablet (20 mg total) by mouth daily.  30 tablet  6  . BRILINTA 90 MG TABS tablet TAKE ONE TABLET BY MOUTH TWO TIMES A DAY  60 tablet  6  . escitalopram (LEXAPRO) 10 MG tablet Take 10 mg by mouth daily.        . famotidine (PEPCID) 20 MG tablet Take 1 tablet (20 mg total) by mouth 2 (two) times daily.  60 tablet  6  . nitroGLYCERIN (NITROSTAT) 0.4 MG SL tablet Place 0.4 mg under the tongue every 5 (five) minutes as needed.        Marland Kitchen  traMADol (ULTRAM) 50 MG tablet TAKE 1 TABLET TWICE A DAY AS NEEDED  60 tablet  3   Review of Systems Review of Systems  Constitutional: Negative for diaphoresis, activity change, appetite change and unexpected weight change.  HENT: Negative for hearing loss, ear pain, facial swelling, mouth sores and neck stiffness.   Eyes: Negative for pain, redness and visual disturbance.  Respiratory: Negative for shortness of breath and wheezing.   Cardiovascular: Negative for chest pain and palpitations.  Gastrointestinal: Negative for diarrhea, blood in stool, abdominal distention and rectal pain.  Genitourinary: Negative for hematuria, flank pain and decreased urine volume.  Musculoskeletal:  Negative for myalgias and joint swelling.  Skin: Negative for color change and wound.  Neurological: Negative for syncope and numbness.  Hematological: Negative for adenopathy.  Psychiatric/Behavioral: Negative for hallucinations, self-injury, decreased concentration and agitation.      Objective:   Physical Exam BP 130/82  Pulse 75  Temp(Src) 97.3 F (36.3 C) (Oral)  Ht 5\' 4"  (1.626 m)  Wt 170 lb 6 oz (77.282 kg)  BMI 29.24 kg/m2  SpO2 97% Physical Exam  VS noted Constitutional: Pt appears well-developed and well-nourished.  HENT: Head: Normocephalic.  Right Ear: External ear normal.  Left Ear: External ear normal.  Eyes: Conjunctivae and EOM are normal. Pupils are equal, round, and reactive to light.  Neck: Normal range of motion. Neck supple.  Cardiovascular: Normal rate and regular rhythm.   Pulmonary/Chest: Effort normal and breath sounds normal.  Abd:  Soft, NT, non-distended, + BS Neurological: Pt is alert. No cranial nerve deficit.  Skin: Skin is warm. No erythema. has eczematous rash to arms Psychiatric: Pt behavior is normal. Thought content normal.     Assessment & Plan:

## 2011-11-30 ENCOUNTER — Other Ambulatory Visit (INDEPENDENT_AMBULATORY_CARE_PROVIDER_SITE_OTHER): Payer: BC Managed Care – PPO | Admitting: *Deleted

## 2011-11-30 DIAGNOSIS — Z0000 Encounter for general adult medical examination without abnormal findings: Secondary | ICD-10-CM

## 2011-11-30 LAB — HEPATIC FUNCTION PANEL
ALT: 37 U/L (ref 0–53)
Total Bilirubin: 0.7 mg/dL (ref 0.3–1.2)
Total Protein: 6.5 g/dL (ref 6.0–8.3)

## 2011-12-09 ENCOUNTER — Other Ambulatory Visit: Payer: Self-pay

## 2011-12-09 DIAGNOSIS — F419 Anxiety disorder, unspecified: Secondary | ICD-10-CM

## 2011-12-09 MED ORDER — ALPRAZOLAM 0.5 MG PO TABS: 0.5000 mg | ORAL_TABLET | Freq: Every evening | ORAL | Status: DC | PRN

## 2011-12-09 NOTE — Telephone Encounter (Signed)
Faxed hardcopy to pharmacy. 

## 2011-12-09 NOTE — Telephone Encounter (Signed)
Done hardcopy to robin  

## 2011-12-21 HISTORY — PX: COLONOSCOPY: SHX174

## 2011-12-24 ENCOUNTER — Other Ambulatory Visit: Payer: Self-pay | Admitting: Internal Medicine

## 2012-01-28 ENCOUNTER — Encounter: Payer: Self-pay | Admitting: Endocrinology

## 2012-01-28 ENCOUNTER — Ambulatory Visit (INDEPENDENT_AMBULATORY_CARE_PROVIDER_SITE_OTHER): Payer: BC Managed Care – PPO | Admitting: Endocrinology

## 2012-01-28 VITALS — BP 124/86 | HR 86 | Temp 98.2°F | Ht 64.0 in | Wt 169.0 lb

## 2012-01-28 DIAGNOSIS — J209 Acute bronchitis, unspecified: Secondary | ICD-10-CM

## 2012-01-28 MED ORDER — AZITHROMYCIN 500 MG PO TABS: 500.0000 mg | ORAL_TABLET | Freq: Every day | ORAL | Status: AC

## 2012-01-28 MED ORDER — HALOBETASOL PROPIONATE 0.05 % EX CREA: TOPICAL_CREAM | Freq: Three times a day (TID) | CUTANEOUS | Status: DC | PRN

## 2012-01-28 NOTE — Progress Notes (Signed)
Subjective:    Patient ID: [name] Gross, male    DOB: 15-May-1961, 51 y.o.   MRN: [id number]  HPI Pt states 1 week of slight epistaxis from the right nare, and assoc nasal congestion. Skin rash on the forearms has recurred.  He received  steroid injection for this a few mos ago.   Past Medical History  Diagnosis Date  . CAD (coronary artery disease)     s/p INF STEMI 12/04/2010: treated with BMS to RCA c/b acute stent thrombosis at completion of  primary PCI ...above tx with second BMS with adjunctive thrombectomy residual CAD at cath 12/04/2010: pRCA 40-50%; LAD and CFX ok; EF 45% Echo 12/06/2010: Ef 55-65%; ?HK inferior wall; mild MR  . Glucose intolerance (impaired glucose tolerance)   . Asthma     mild intermittent  . GERD (gastroesophageal reflux disease)   . HTN (hypertension)   . Obesity   . Prostate cancer   . Anxiety   . Allergic rhinitis   . Osteoarthritis   . Erectile dysfunction   . OSA (obstructive sleep apnea)     mild, no CPAP per pt preference    Past Surgical History  Procedure Date  . Coronary stent placement     Stenting of the right coronary artery......... Successful percutaneous coronary intervention with  adjunctive thrombectomy for treatment of acute stent thrombosis and   reocclusion of the right coronary artery after treatment earlier today  of an acute myocardial infarction  . Nasal sinus surgery   . Knee surgery   . Spermatocelectomy   . Wrist surgery   . Elbow surgery     History   Social History  . Marital Status: Married    Spouse Name: N/A    Number of Children: N/A  . Years of Education: N/A   Occupational History  . Not on file.   Social History Main Topics  . Smoking status: Former Smoker    Types: Cigarettes  . Smokeless tobacco: Not on file  . Alcohol Use: No  . Drug Use: No  . Sexually Active: Not on file   Other Topics Concern  . Not on file   Social History Narrative   [employer] - [employer]    Current  Outpatient Prescriptions on File Prior to Visit  Medication Sig Dispense Refill  . ALPRAZolam (XANAX) 0.5 MG tablet Take 1 tablet (0.5 mg total) by mouth at bedtime as needed.  30 tablet  3  . aspirin 81 MG tablet Take 81 mg by mouth daily.        Marland Kitchen atorvastatin (LIPITOR) 20 MG tablet Take 1 tablet (20 mg total) by mouth daily.  30 tablet  6  . BRILINTA 90 MG TABS tablet TAKE ONE TABLET BY MOUTH TWO TIMES A DAY  60 tablet  6  . escitalopram (LEXAPRO) 10 MG tablet Take 10 mg by mouth daily.        . famotidine (PEPCID) 20 MG tablet Take 1 tablet (20 mg total) by mouth 2 (two) times daily.  60 tablet  6  . fexofenadine (ALLEGRA) 180 MG tablet Take 1 tablet (180 mg total) by mouth daily.  30 tablet  11  . nitroGLYCERIN (NITROSTAT) 0.4 MG SL tablet Place 0.4 mg under the tongue every 5 (five) minutes as needed.        . traMADol (ULTRAM) 50 MG tablet TAKE 1 TABLET TWICE A DAY AS NEEDED  60 tablet  3  . triamcinolone cream (KENALOG) 0.1 % APPLY  TOPICALLY 2 (TWO) TIMES DAILY.  30 g  8    Allergies  Allergen Reactions  . Amoxicillin     Family History  Problem Relation Age of Onset  . Diabetes Father   . Hip fracture Father   . Coronary artery disease Father     BP 124/86  Pulse 86  Temp(Src) 98.2 F (36.8 C) (Oral)  Ht 5\' 4"  (1.626 m)  Wt 169 lb (76.658 kg)  BMI 29.01 kg/m2  SpO2 96%  Review of Systems Denies fever, cough, and otalgia    Objective:   Physical Exam VITAL SIGNS:  See vs page GENERAL: no distress head: no deformity eyes: no periorbital swelling, no proptosis external nose and ears are normal mouth: no lesion seen NECK: There is no palpable thyroid enlargement.  No thyroid nodule is palpable.  No palpable lymphadenopathy at the anterior neck. LUNGS:  Clear to auscultation Skin:  Minimal polypapular rash       Assessment & Plan:  URI, new Epistaxis, prob related to URI Rash, uncertain etiology

## 2012-01-28 NOTE — Patient Instructions (Addendum)
i have sent a prescription to your pharmacy, for an antibiotic, and for a steroid skin cream. Changing the allegra to allegra-d will help the congestion.   I hope you feel better soon.  If you don't feel better by next week, please call dr Jonny Ruiz.

## 2012-02-09 ENCOUNTER — Other Ambulatory Visit: Payer: Self-pay | Admitting: Internal Medicine

## 2012-02-10 NOTE — Telephone Encounter (Signed)
Done per emr 

## 2012-02-16 ENCOUNTER — Other Ambulatory Visit: Payer: Self-pay | Admitting: Internal Medicine

## 2012-03-06 ENCOUNTER — Telehealth: Payer: Self-pay | Admitting: Cardiovascular Disease

## 2012-03-06 NOTE — Telephone Encounter (Signed)
Please return call to patient at [phone]  Patient is sched for ROV with Excell Seltzer on 5/2, also has appnt with labs on 4/29 Dr. Annabell Howells.  If at all possible can patient lab orders be sent over to avoid 2 needle sticks.  He can be reached at [phone]

## 2012-03-06 NOTE — Telephone Encounter (Signed)
I spoke with the pt and mailed an order to his home to have a lipid and liver drawn at Dr Belva Crome.

## 2012-03-14 ENCOUNTER — Other Ambulatory Visit: Payer: Self-pay | Admitting: Endocrinology

## 2012-03-26 IMAGING — US US ABDOMEN COMPLETE
1 series · 14 of 25 positions shown · non-contrast
Comparison: 12/18/2008

CLINICAL DATA: Abnormal liver enzymes

ABDOMINAL ULTRASOUND COMPLETE

[Series 1: us abdomen complete · 0.39mm/px · 14 of 83 slices shown]
[im 1/83]
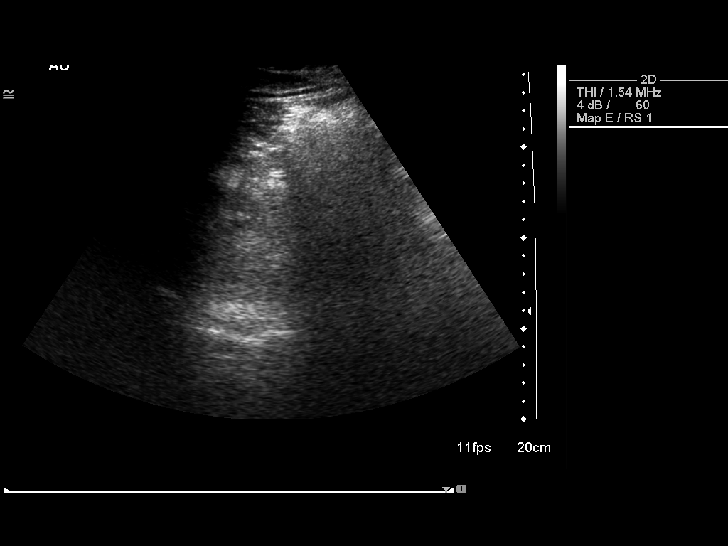
[im 7/83]
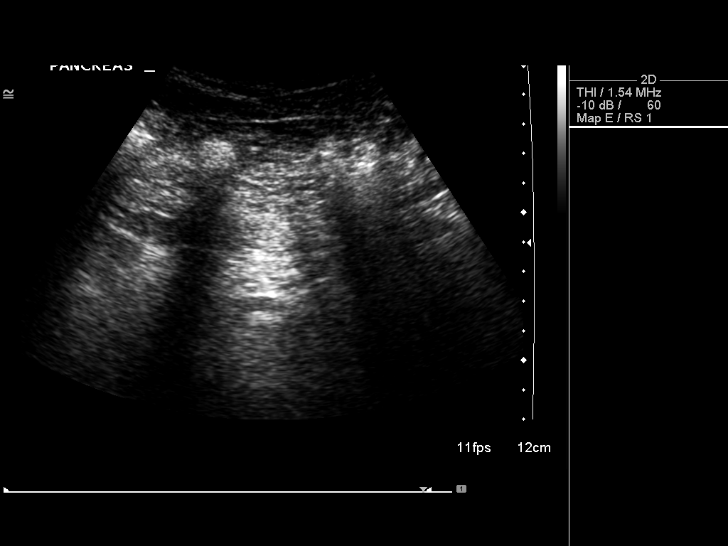
[im 14/83]
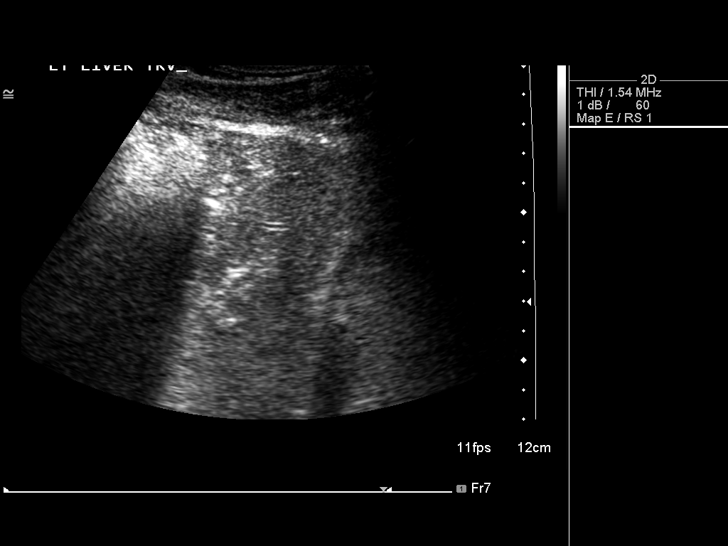
[im 21/83]
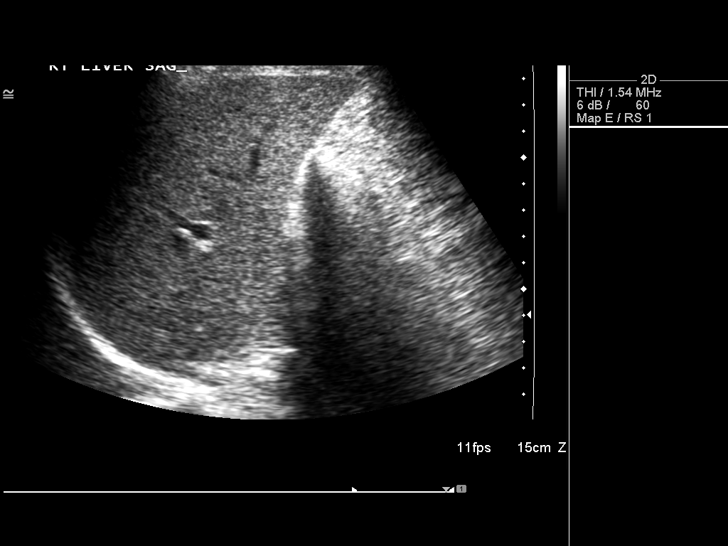
[im 28/83]
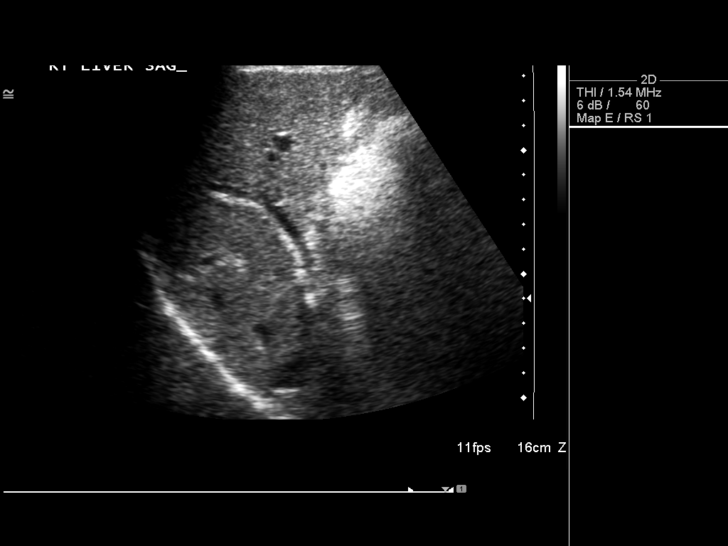
[im 31/83]
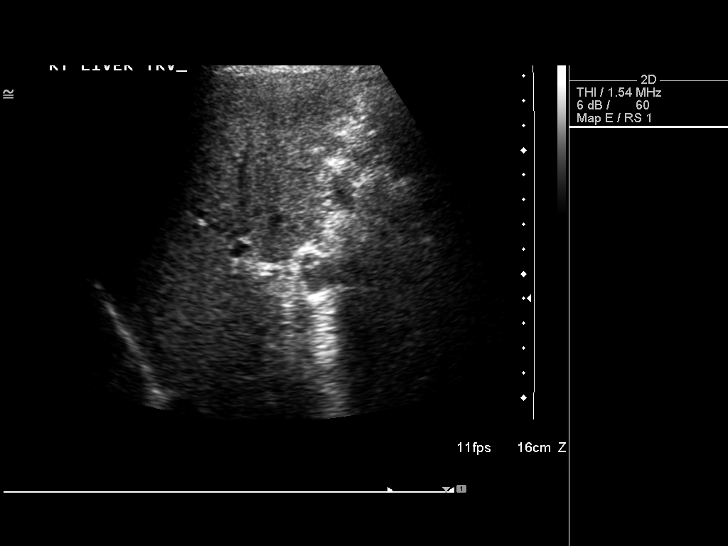
[im 38/83]
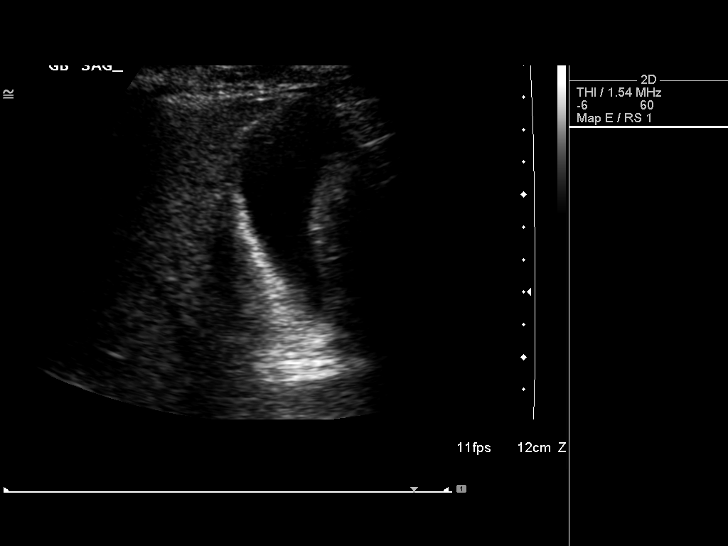
[im 45/83]
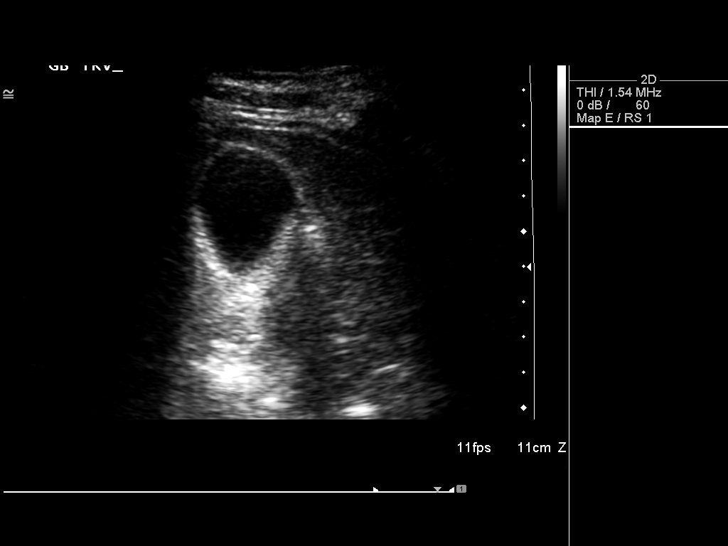
[im 52/83]
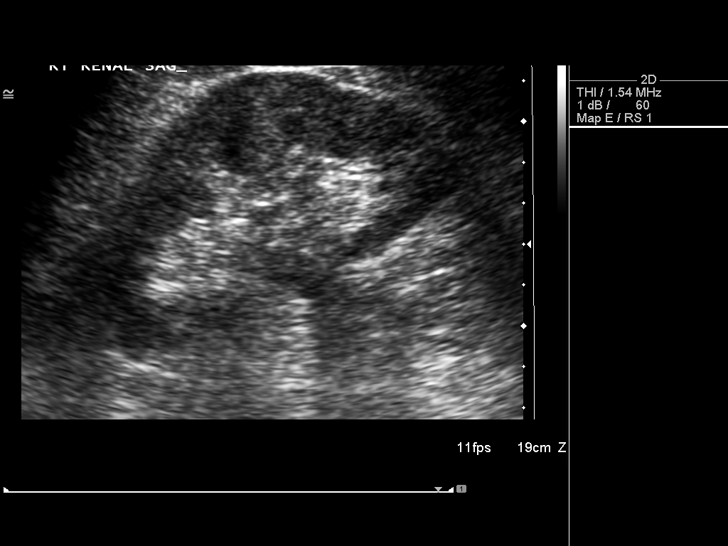
[im 55/83]
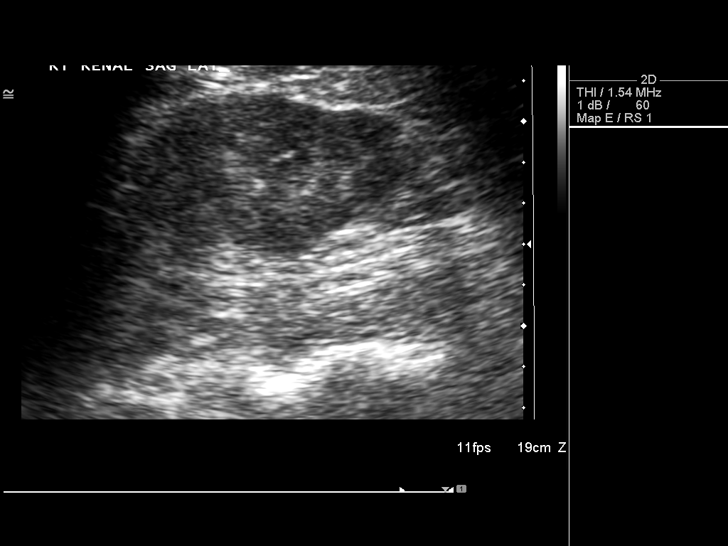
[im 62/83]
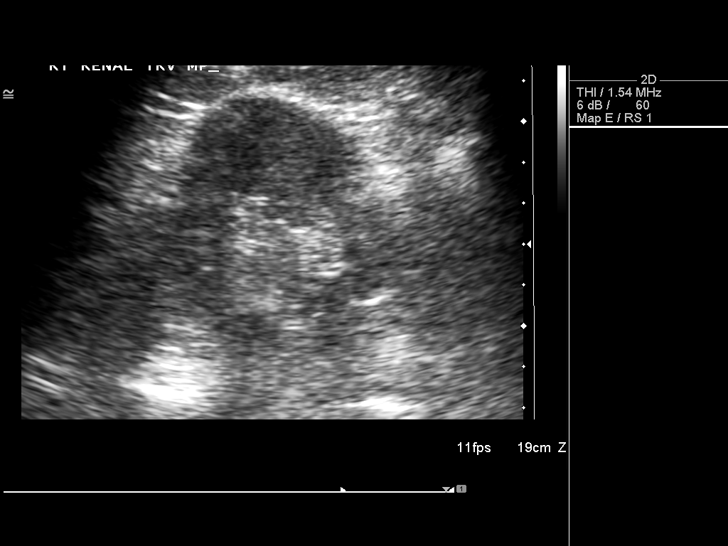
[im 69/83]
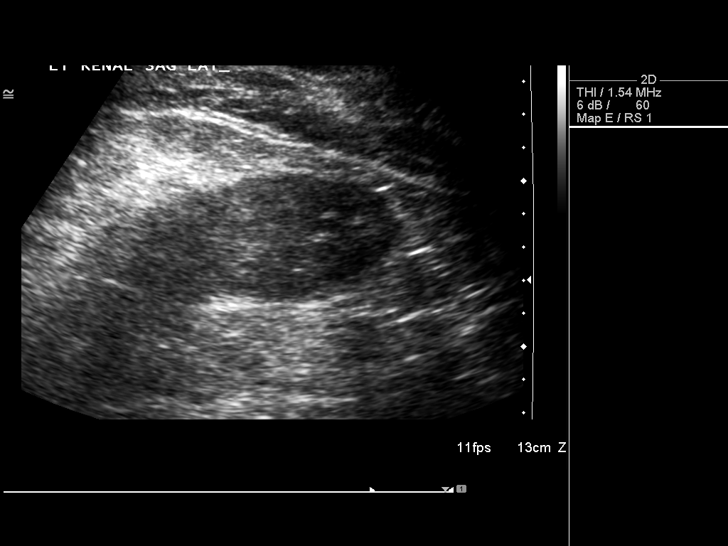
[im 76/83]
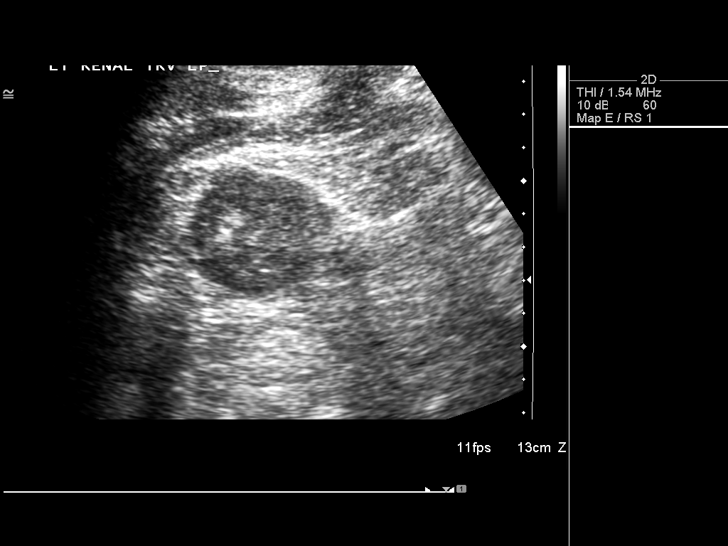
[im 83/83]
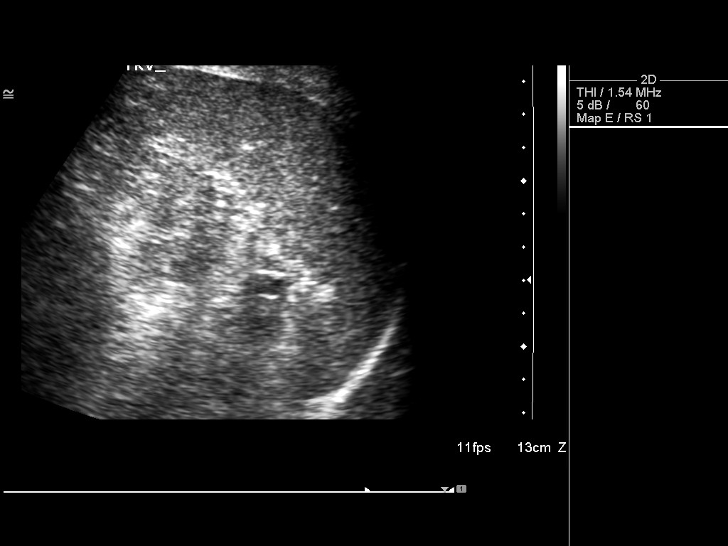

[14 of 25 positions shown; findings below may reference images not displayed]

FINDINGS: Gallbladder:  No gallstones, gallbladder wall thickening, or
pericholecystic fluid.

Common Bile Duct:  Within normal limits in caliber.

Liver: No focal mass lesion identified.  Within normal limits in
parenchymal echogenicity.

IVC:  Appears normal.

Pancreas:  No abnormality identified.

Spleen:  Within normal limits in size and echotexture.

Right kidney:  Normal in size and parenchymal echogenicity.  No
evidence of mass or hydronephrosis.

Left kidney:  Normal in size and parenchymal echogenicity.  No
evidence of mass or hydronephrosis.

Abdominal Aorta:  No aneurysm identified.
IMPRESSION: Negative abdominal ultrasound.

## 2012-04-17 ENCOUNTER — Other Ambulatory Visit (INDEPENDENT_AMBULATORY_CARE_PROVIDER_SITE_OTHER): Payer: BC Managed Care – PPO

## 2012-04-17 DIAGNOSIS — I2510 Atherosclerotic heart disease of native coronary artery without angina pectoris: Secondary | ICD-10-CM

## 2012-04-17 DIAGNOSIS — E785 Hyperlipidemia, unspecified: Secondary | ICD-10-CM

## 2012-04-17 LAB — HEPATIC FUNCTION PANEL
ALT: 55 U/L — ABNORMAL HIGH (ref 0–53)
Bilirubin, Direct: 0.1 mg/dL (ref 0.0–0.3)
Total Bilirubin: 0.6 mg/dL (ref 0.3–1.2)

## 2012-04-17 LAB — LIPID PANEL: VLDL: 37.4 mg/dL (ref 0.0–40.0)

## 2012-04-20 ENCOUNTER — Encounter: Payer: Self-pay | Admitting: Cardiovascular Disease

## 2012-04-20 ENCOUNTER — Ambulatory Visit (INDEPENDENT_AMBULATORY_CARE_PROVIDER_SITE_OTHER): Payer: BC Managed Care – PPO | Admitting: Cardiovascular Disease

## 2012-04-20 VITALS — BP 130/80 | HR 69 | Ht 64.0 in | Wt 167.1 lb

## 2012-04-20 DIAGNOSIS — I2510 Atherosclerotic heart disease of native coronary artery without angina pectoris: Secondary | ICD-10-CM

## 2012-04-20 DIAGNOSIS — E785 Hyperlipidemia, unspecified: Secondary | ICD-10-CM

## 2012-04-20 NOTE — Progress Notes (Signed)
HPI:  A 51 year old gentleman with coronary artery disease who initially presented with an acute inferior ST elevation infarction in December 2011. He was treated with a bare-metal stent in right coronary artery. He unfortunately had acute stent thrombosis and had undergo a second PCI procedure with the second bare-metal stent. He actually has done quite well since that time with no recurrent ischemic events. He had difficulty with anxiety for several months following his MRI. His last echocardiogram demonstrated a normal left ventricular ejection fraction in the range of 55-65%. He did a treadmill exercise tolerance test several months ago where he had excellent exercise tolerance and no ischemic EKG changes. He is continued on aspirin and brilinta.  From a symptomatic perspective, he is doing very well. He denies chest pain or pressure. He is physically active with no exertional symptoms. He does admit to brief episodes of dyspnea that occur without warning. They last only for a matter of seconds and then resolve spontaneously. These have only been present for about 6 months. He has no edema, orthopnea, or PND. He's had some joint aches and he attributes this to statin therapy.  Outpatient Encounter Prescriptions as of 04/20/2012  Medication Sig Dispense Refill  . ALPRAZolam (XANAX) 0.5 MG tablet Take 1 tablet (0.5 mg total) by mouth at bedtime as needed.  30 tablet  3  . aspirin 81 MG tablet Take 81 mg by mouth daily.        Marland Kitchen atorvastatin (LIPITOR) 20 MG tablet Take 1 tablet (20 mg total) by mouth daily.  30 tablet  6  . BRILINTA 90 MG TABS tablet TAKE ONE TABLET BY MOUTH TWO TIMES A DAY  60 tablet  6  . escitalopram (LEXAPRO) 10 MG tablet TAKE 1 TABLET EVERY DAY  90 tablet  3  . famotidine (PEPCID) 20 MG tablet Take 1 tablet (20 mg total) by mouth 2 (two) times daily.  60 tablet  6  . fexofenadine (ALLEGRA) 180 MG tablet Take 180 mg by mouth as needed.      . nitroGLYCERIN (NITROSTAT) 0.4 MG SL  tablet Place 0.4 mg under the tongue every 5 (five) minutes as needed.        . traMADol (ULTRAM) 50 MG tablet TAKE 1 TABLET TWICE A DAY AS NEEDED  60 tablet  3  . triamcinolone cream (KENALOG) 0.1 % APPLY TOPICALLY 2 (TWO) TIMES DAILY.  30 g  8  . DISCONTD: fexofenadine (ALLEGRA) 180 MG tablet Take 1 tablet (180 mg total) by mouth daily.  30 tablet  11  . halobetasol (ULTRAVATE) 0.05 % cream APPLY TO AFFECTED AREA 3 TIMES A DAY  50 g  0    Allergies  Allergen Reactions  . Amoxicillin     Past Medical History  Diagnosis Date  . CAD (coronary artery disease)     s/p INF STEMI 12/04/2010: treated with BMS to RCA c/b acute stent thrombosis at completion of  primary PCI ...above tx with second BMS with adjunctive thrombectomy residual CAD at cath 12/04/2010: pRCA 40-50%; LAD and CFX ok; EF 45% Echo 12/06/2010: Ef 55-65%; ?HK inferior wall; mild MR  . Glucose intolerance (impaired glucose tolerance)   . Asthma     mild intermittent  . GERD (gastroesophageal reflux disease)   . HTN (hypertension)   . Obesity   . Prostate cancer   . Anxiety   . Allergic rhinitis   . Osteoarthritis   . Erectile dysfunction   . OSA (obstructive sleep apnea)  mild, no CPAP per pt preference    ROS: Negative except as per HPI  BP 130/80  Pulse 69  Ht 5\' 4"  (1.626 m)  Wt 75.805 kg (167 lb 1.9 oz)  BMI 28.69 kg/m2  PHYSICAL EXAM: Pt is alert and oriented, NAD HEENT: normal Neck: JVP - normal, carotids 2+= without bruits Lungs: CTA bilaterally CV: RRR without murmur or gallop Abd: soft, NT, Positive BS, no hepatomegaly Ext: no C/C/E, distal pulses intact and equal Skin: warm/dry. Maculopapular rash is present on the distal lower extremities.  EKG:  Normal sinus rhythm 69 beats per minute, age indeterminate inferior infarct, cannot rule out anterior infarct age undetermined.  ASSESSMENT AND PLAN: 1. Coronary artery disease. The patient is greater than 12 months out from his myocardial  infarction. I recommended that he stop brilinta at this point. He should continue on aspirin 81 mg. I suspect his episodes of shortness of breath are related to brilinta. This may also be causing his rash on his lower extremities. He otherwise will continue his current medical program.  2. Hyperlipidemia. The patient is tolerating statin therapy with Lipitor. He does have some myalgias. I asked him to start coenzyme Q 10 to see if this helps. His recent lipids were reviewed with a cholesterol of 119, triglycerides 187, HDL 45, and LDL 36. His transaminases were mildly elevated but not like him to have these repeated in 6 months.  Tonny Bollman 04/20/2012

## 2012-04-20 NOTE — Patient Instructions (Signed)
Your physician wants you to follow-up in: 1 year with Dr. Excell Seltzer. You will receive a reminder letter in the mail two months in advance. If you don't receive a letter, please call our office to schedule the follow-up appointment.  Start over-the-counter Colace for constipation.  Start over-the-counter Co Q 10 to help with joint pain.  Stop Brillinta.

## 2012-06-07 ENCOUNTER — Telehealth: Payer: Self-pay | Admitting: Internal Medicine

## 2012-06-07 ENCOUNTER — Encounter: Payer: Self-pay | Admitting: Internal Medicine

## 2012-06-07 ENCOUNTER — Telehealth: Payer: Self-pay | Admitting: Cardiovascular Disease

## 2012-06-07 NOTE — Telephone Encounter (Signed)
New problem:  Patient wife calling. Need approval to have colonoscopy  Which is schedule for August.

## 2012-06-07 NOTE — Telephone Encounter (Signed)
I spoke with the pt's wife and made her aware that at this time the pt does not require clearance for colonoscopy.  The pt is no longer taking Brilinta and he is out more than 12 months from MI.  I made her aware that when the pt goes in for pre-op in August if he needs clearance then GI will contact our office.

## 2012-06-07 NOTE — Telephone Encounter (Signed)
Spoke with patient's wife and he is having rectal pain ? Hemorrhoids. Never been seen here. He is scheduled for direct colon in August. Scheduled with Mike Gip, PA on 06/08/12 at 2:00 PM

## 2012-06-08 ENCOUNTER — Encounter: Payer: Self-pay | Admitting: Internal Medicine

## 2012-06-08 ENCOUNTER — Ambulatory Visit: Payer: BC Managed Care – PPO | Admitting: Physician Assistant

## 2012-06-08 ENCOUNTER — Ambulatory Visit (INDEPENDENT_AMBULATORY_CARE_PROVIDER_SITE_OTHER): Payer: BC Managed Care – PPO | Admitting: Internal Medicine

## 2012-06-08 VITALS — BP 138/90 | HR 80 | Temp 98.3°F | Resp 16 | Wt 169.0 lb

## 2012-06-08 DIAGNOSIS — K645 Perianal venous thrombosis: Secondary | ICD-10-CM

## 2012-06-08 NOTE — Progress Notes (Signed)
  Subjective:    Patient ID: [name] Gross, male    DOB: 1961-09-19, 51 y.o.   MRN: [id number]  HPI  New to me he complains of a one week history of a painful hemorrhoid without bleeding.  Review of Systems  Constitutional: Negative.   HENT: Negative.   Eyes: Negative.   Respiratory: Negative.   Cardiovascular: Negative.   Gastrointestinal: Positive for constipation and rectal pain. Negative for nausea, vomiting, abdominal pain, diarrhea, blood in stool, abdominal distention and anal bleeding.  Genitourinary: Negative.   Musculoskeletal: Negative.   Skin: Negative.   Neurological: Negative.   Hematological: Negative.   Psychiatric/Behavioral: Negative.        Objective:   Physical Exam  Vitals reviewed. Constitutional: He is oriented to person, place, and time. He appears well-developed and well-nourished. No distress.  HENT:  Head: Normocephalic and atraumatic.  Mouth/Throat: Oropharynx is clear and moist. No oropharyngeal exudate.  Eyes: Conjunctivae are normal. Right eye exhibits no discharge. Left eye exhibits no discharge. No scleral icterus.  Neck: Normal range of motion. Neck supple. No JVD present. No tracheal deviation present. No thyromegaly present.  Cardiovascular: Normal rate, regular rhythm, normal heart sounds and intact distal pulses.  Exam reveals no gallop and no friction rub.   No murmur heard. Pulmonary/Chest: Effort normal and breath sounds normal. No stridor. No respiratory distress. He has no wheezes. He has no rales. He exhibits no tenderness.  Abdominal: Soft. Bowel sounds are normal. He exhibits no distension and no mass. There is no tenderness. There is no rebound and no guarding.  Genitourinary: Prostate normal. Rectal exam shows external hemorrhoid (thrombosed, on the right lateral side). Rectal exam shows no internal hemorrhoid, no fissure, no mass, no tenderness and anal tone normal. Guaiac negative stool. Prostate is not enlarged and not tender.    Perianal area was prepped and draped in sterile fashion and cleaned with betadine, local anesthesia obtained with 2% plain lido, an 11 blade was used to make an incision and a clot was easily removed, there was no post procedure bleeding, he tolerated the proc well  Musculoskeletal: Normal range of motion. He exhibits no edema and no tenderness.  Lymphadenopathy:    He has no cervical adenopathy.  Neurological: He is oriented to person, place, and time.  Skin: Skin is warm and dry. No rash noted. He is not diaphoretic. No erythema. No pallor.          Assessment & Plan:

## 2012-06-08 NOTE — Patient Instructions (Signed)

## 2012-06-12 ENCOUNTER — Other Ambulatory Visit: Payer: Self-pay | Admitting: Internal Medicine

## 2012-06-13 NOTE — Telephone Encounter (Signed)
Done erx 

## 2012-07-03 ENCOUNTER — Other Ambulatory Visit: Payer: Self-pay

## 2012-07-03 DIAGNOSIS — F419 Anxiety disorder, unspecified: Secondary | ICD-10-CM

## 2012-07-03 MED ORDER — ALPRAZOLAM 0.5 MG PO TABS: 0.5000 mg | ORAL_TABLET | Freq: Every evening | ORAL | Status: DC | PRN

## 2012-07-03 NOTE — Telephone Encounter (Signed)
Faxed hardcopy to pharmacy. 

## 2012-07-03 NOTE — Telephone Encounter (Signed)
Done hardcopy to robin  

## 2012-07-26 ENCOUNTER — Ambulatory Visit (AMBULATORY_SURGERY_CENTER): Payer: BC Managed Care – PPO | Admitting: *Deleted

## 2012-07-26 VITALS — Ht 64.0 in | Wt 172.9 lb

## 2012-07-26 DIAGNOSIS — Z1211 Encounter for screening for malignant neoplasm of colon: Secondary | ICD-10-CM

## 2012-07-26 MED ORDER — MOVIPREP 100 G PO SOLR: ORAL | Status: DC

## 2012-07-27 ENCOUNTER — Encounter: Payer: Self-pay | Admitting: Internal Medicine

## 2012-08-09 ENCOUNTER — Encounter: Payer: Self-pay | Admitting: Internal Medicine

## 2012-08-09 ENCOUNTER — Ambulatory Visit (AMBULATORY_SURGERY_CENTER): Payer: BC Managed Care – PPO | Admitting: Internal Medicine

## 2012-08-09 VITALS — BP 139/89 | HR 90 | Temp 97.6°F | Resp 17 | Ht 64.0 in | Wt 172.0 lb

## 2012-08-09 DIAGNOSIS — Z1211 Encounter for screening for malignant neoplasm of colon: Secondary | ICD-10-CM

## 2012-08-09 MED ORDER — SODIUM CHLORIDE 0.9 % IV SOLN: 500.0000 mL | INTRAVENOUS | Status: DC

## 2012-08-09 NOTE — Progress Notes (Signed)
Patient did not experience any of the following events: a burn prior to discharge; a fall within the facility; wrong site/side/patient/procedure/implant event; or a hospital transfer or hospital admission upon discharge from the facility. (G8907) Patient did not have preoperative order for IV antibiotic SSI prophylaxis. (G8918)  

## 2012-08-09 NOTE — Op Note (Signed)
[facility] [address] [place], [address]   COLONOSCOPY PROCEDURE REPORT  PATIENT: [name] Gross, [name] Gross  MR#: [id number] BIRTHDATE: 26-Jan-1961 , 51  yrs. old GENDER: Male ENDOSCOPIST: Hart Carwin, MD REFERRED BY:  Oliver Barre, M.D. PROCEDURE DATE:  08/09/2012 PROCEDURE:   Colonoscopy, screening ASA CLASS:   Class III INDICATIONS:average risk patient for colon cancer. MEDICATIONS: MAC sedation, administered by CRNA, Propofol (Diprivan), and Propofol (Diprivan) 180 mg IV  DESCRIPTION OF PROCEDURE:   After the risks and benefits and of the procedure were explained, informed consent was obtained.  A digital rectal exam revealed no rectal mass.    The LB [id number] endoscope was introduced through the anus and advanced to the cecum, which was identified by both the appendix and ileocecal valve .  The quality of the prep was excellent, using MoviPrep . The instrument was then slowly withdrawn as the colon was fully examined.     COLON FINDINGS: A normal appearing cecum, ileocecal valve, and appendiceal orifice were identified.  The ascending, hepatic flexure, transverse, splenic flexure, descending, sigmoid colon and rectum appeared unremarkable.  No polyps or cancers were seen. Retroflexed views revealed no abnormalities.     The scope was then withdrawn from the patient and the procedure completed.  COMPLICATIONS: There were no complications. ENDOSCOPIC IMPRESSION: 1.   Normal colon 2.    no polyps, exam to the cecum  RECOMMENDATIONS: High fiber diet.   REPEAT EXAM: In 10 year(s)  for Colonoscopy.  cc:  _______________________________ eSignedHart Carwin, MD 08/09/2012 8:31 AM

## 2012-08-09 NOTE — Patient Instructions (Addendum)

## 2012-08-10 ENCOUNTER — Telehealth: Payer: Self-pay | Admitting: *Deleted

## 2012-08-10 NOTE — Telephone Encounter (Signed)
  Follow up Call-  Call back number 08/09/2012  Post procedure Call Back phone  # [phone] cell  Permission to leave phone message Yes     Patient questions:  Do you have a fever, pain , or abdominal swelling? no Pain Score  0 *  Have you tolerated food without any problems? yes  Have you been able to return to your normal activities? yes  Do you have any questions about your discharge instructions: Diet   no Medications  no Follow up visit  no  Do you have questions or concerns about your Care? no  Actions: * If pain score is 4 or above: No action needed, pain <4.

## 2012-08-18 ENCOUNTER — Other Ambulatory Visit: Payer: Self-pay | Admitting: Cardiovascular Disease

## 2012-10-09 ENCOUNTER — Telehealth: Payer: Self-pay

## 2012-10-09 DIAGNOSIS — Z0000 Encounter for general adult medical examination without abnormal findings: Secondary | ICD-10-CM

## 2012-10-09 NOTE — Telephone Encounter (Signed)
Put lab order in. 

## 2012-11-10 ENCOUNTER — Other Ambulatory Visit (INDEPENDENT_AMBULATORY_CARE_PROVIDER_SITE_OTHER): Payer: BC Managed Care – PPO

## 2012-11-10 DIAGNOSIS — Z0000 Encounter for general adult medical examination without abnormal findings: Secondary | ICD-10-CM

## 2012-11-10 LAB — CBC WITH DIFFERENTIAL/PLATELET
Basophils Absolute: 0.1 10*3/uL (ref 0.0–0.1)
Eosinophils Absolute: 0.5 10*3/uL (ref 0.0–0.7)
Lymphocytes Relative: 26.2 % (ref 12.0–46.0)
Monocytes Relative: 8 % (ref 3.0–12.0)
Neutrophils Relative %: 58.1 % (ref 43.0–77.0)
Platelets: 312 10*3/uL (ref 150.0–400.0)
RDW: 12.9 % (ref 11.5–14.6)

## 2012-11-10 LAB — BASIC METABOLIC PANEL
CO2: 30 mEq/L (ref 19–32)
Chloride: 103 mEq/L (ref 96–112)
Sodium: 140 mEq/L (ref 135–145)

## 2012-11-10 LAB — URINALYSIS, ROUTINE W REFLEX MICROSCOPIC
Bilirubin Urine: NEGATIVE
Hgb urine dipstick: NEGATIVE
Total Protein, Urine: NEGATIVE
Urine Glucose: NEGATIVE
pH: 6 (ref 5.0–8.0)

## 2012-11-10 LAB — LDL CHOLESTEROL, DIRECT: Direct LDL: 64.3 mg/dL

## 2012-11-10 LAB — HEPATIC FUNCTION PANEL
ALT: 40 U/L (ref 0–53)
Alkaline Phosphatase: 79 U/L (ref 39–117)
Bilirubin, Direct: 0.1 mg/dL (ref 0.0–0.3)
Total Bilirubin: 0.7 mg/dL (ref 0.3–1.2)
Total Protein: 6.7 g/dL (ref 6.0–8.3)

## 2012-11-10 LAB — LIPID PANEL: HDL: 42.6 mg/dL (ref >39.00)

## 2012-11-10 LAB — PSA: PSA: 0.01 ng/mL — ABNORMAL LOW (ref 0.10–4.00)

## 2012-11-20 ENCOUNTER — Ambulatory Visit (INDEPENDENT_AMBULATORY_CARE_PROVIDER_SITE_OTHER): Payer: BC Managed Care – PPO | Admitting: Internal Medicine

## 2012-11-20 ENCOUNTER — Encounter: Payer: Self-pay | Admitting: Internal Medicine

## 2012-11-20 VITALS — BP 110/80 | HR 75 | Temp 97.5°F | Ht 64.0 in | Wt 174.0 lb

## 2012-11-20 DIAGNOSIS — R21 Rash and other nonspecific skin eruption: Secondary | ICD-10-CM

## 2012-11-20 DIAGNOSIS — Z23 Encounter for immunization: Secondary | ICD-10-CM

## 2012-11-20 DIAGNOSIS — R7302 Impaired glucose tolerance (oral): Secondary | ICD-10-CM

## 2012-11-20 DIAGNOSIS — Z0000 Encounter for general adult medical examination without abnormal findings: Secondary | ICD-10-CM

## 2012-11-20 MED ORDER — FLUOCINONIDE 0.05 % EX CREA: TOPICAL_CREAM | Freq: Two times a day (BID) | CUTANEOUS | Status: DC

## 2012-11-20 MED ORDER — METHYLPREDNISOLONE ACETATE 80 MG/ML IJ SUSP
120.0000 mg | Freq: Once | INTRAMUSCULAR | Status: AC
  Administered 2012-11-20: 120 mg via INTRAMUSCULAR

## 2012-11-20 NOTE — Assessment & Plan Note (Signed)
stable overall by hx and exam, most recent data reviewed with pt, and pt to continue medical treatment as before  Lab Results  Component Value Date   HGBA1C 6.3 11/09/2011    

## 2012-11-20 NOTE — Patient Instructions (Addendum)
You had the flu shot today, and You had the steroid shot today Take all new medications as prescribed - the cream Please return if you change your mind about the tetanus Continue all other medications as before Please have the pharmacy call with any other refills you may need. Thank you for enrolling in MyChart. Please follow the instructions below to securely access your online medical record. MyChart allows you to send messages to your doctor, view your test results, renew your prescriptions, schedule appointments, and more. To Log into MyChart, please go to [email or web address], and your Username is: [email or web address] Please keep your appointments with your specialists as you have planned - Dr Excell Seltzer, and Dr Wrenn/urology We will fax your PSA to Dr Annabell Howells Please continue your efforts at being more active, low cholesterol diet, and weight control You are otherwise up to date with prevention measures Please return in 1 year for your yearly visit, or sooner if needed, with Lab testing done 3-5 days before

## 2012-11-20 NOTE — Assessment & Plan Note (Signed)
Several lesions c/w allergic rash, for depomedrol IM, and lidex prn

## 2012-11-20 NOTE — Assessment & Plan Note (Signed)

## 2012-11-20 NOTE — Progress Notes (Signed)
Subjective:    Patient ID: [name] Gross, male    DOB: Oct 18, 1961, 51 y.o.   MRN: [id number]  HPI  Here for wellness and f/u;  Overall doing ok;  Pt denies CP, worsening SOB, DOE, wheezing, orthopnea, PND, worsening LE edema, palpitations, dizziness or syncope.  Pt denies neurological change such as new Headache, facial or extremity weakness.  Pt denies polydipsia, polyuria, or low sugar symptoms. Pt states overall good compliance with treatment and medications, good tolerability, and trying to follow lower cholesterol diet.  Pt denies worsening depressive symptoms, suicidal ideation or panic. No fever, wt loss, night sweats, loss of appetite, or other constitutional symptoms.  Pt states good ability with ADL's, low fall risk, home safety reviewed and adequate, no significant changes in hearing or vision, and occasionally active with exercise.  Missed f/u with Dr Wrenn/urology recently he thinks.  Rash from last visit seemed to go away after the brillinta stopped, but now seems to be starting up again small ithcy areas to legs and arms only.  Plans to see Dr Excell Seltzer about myalgias assoc with itching. Past Medical History  Diagnosis Date  . CAD (coronary artery disease)     s/p INF STEMI 12/04/2010: treated with BMS to RCA c/b acute stent thrombosis at completion of  primary PCI ...above tx with second BMS with adjunctive thrombectomy residual CAD at cath 12/04/2010: pRCA 40-50%; LAD and CFX ok; EF 45% Echo 12/06/2010: Ef 55-65%; ?HK inferior wall; mild MR  . Glucose intolerance (impaired glucose tolerance)   . Asthma     mild intermittent  . GERD (gastroesophageal reflux disease)   . HTN (hypertension)   . Obesity   . Anxiety   . Allergic rhinitis   . Osteoarthritis   . Erectile dysfunction   . OSA (obstructive sleep apnea)     mild, no CPAP per pt preference  . Prostate cancer    Past Surgical History  Procedure Date  . Coronary stent placement     Stenting of the right coronary  artery......... Successful percutaneous coronary intervention with  adjunctive thrombectomy for treatment of acute stent thrombosis and   reocclusion of the right coronary artery after treatment earlier today  of an acute myocardial infarction  . Nasal sinus surgery   . Knee surgery   . Spermatocelectomy   . Wrist surgery   . Elbow surgery     reports that he has quit smoking. His smoking use included Cigarettes. He has never used smokeless tobacco. He reports that he does not drink alcohol or use illicit drugs. family history includes Breast cancer in his maternal aunt; Coronary artery disease in his father; Diabetes in his father; Esophageal cancer in his mother; Heart disease in his father, maternal aunt, maternal grandfather, maternal grandmother, maternal uncle, and mother; Hip fracture in his father; and Liver cancer in his maternal uncle.  There is no history of Colon cancer, and Rectal cancer, and Stomach cancer, . Allergies  Allergen Reactions  . Amoxicillin     Per the pt "it was years ago and does not remember the reaction"   Current Outpatient Prescriptions on File Prior to Visit  Medication Sig Dispense Refill  . ALPRAZolam (XANAX) 0.5 MG tablet Take 1 tablet (0.5 mg total) by mouth at bedtime as needed.  30 tablet  3  . aspirin 81 MG tablet Take 81 mg by mouth daily.        Marland Kitchen atorvastatin (LIPITOR) 20 MG tablet TAKE 1 TABLET (20 MG TOTAL)  BY MOUTH DAILY.  30 tablet  6  . escitalopram (LEXAPRO) 10 MG tablet TAKE 1 TABLET EVERY DAY  90 tablet  3  . famotidine (PEPCID) 20 MG tablet Take 1 tablet (20 mg total) by mouth 2 (two) times daily.  60 tablet  6  . nitroGLYCERIN (NITROSTAT) 0.4 MG SL tablet Place 0.4 mg under the tongue every 5 (five) minutes as needed.        . traMADol (ULTRAM) 50 MG tablet TAKE 1 TABLET TWICE A DAY AS NEEDED  60 tablet  3  . triamcinolone cream (KENALOG) 0.1 % APPLY TOPICALLY 2 (TWO) TIMES DAILY.  30 g  8  . fexofenadine (ALLEGRA) 180 MG tablet Take 180  mg by mouth as needed.      . halobetasol (ULTRAVATE) 0.05 % cream APPLY TO AFFECTED AREA 3 TIMES A DAY  50 g  0   Review of Systems Review of Systems  Constitutional: Negative for diaphoresis, activity change, appetite change and unexpected weight change.  HENT: Negative for hearing loss, ear pain, facial swelling, mouth sores and neck stiffness.   Eyes: Negative for pain, redness and visual disturbance.  Respiratory: Negative for shortness of breath and wheezing.   Cardiovascular: Negative for chest pain and palpitations.  Gastrointestinal: Negative for diarrhea, blood in stool, abdominal distention and rectal pain.  Genitourinary: Negative for hematuria, flank pain and decreased urine volume.  Musculoskeletal: Negative for myalgias and joint swelling.  Skin: Negative for color change and wound.  Neurological: Negative for syncope and numbness.  Hematological: Negative for adenopathy.  Psychiatric/Behavioral: Negative for hallucinations, self-injury, decreased concentration and agitation.      Objective:   Physical Exam BP 110/80  Pulse 75  Temp 97.5 F (36.4 C) (Oral)  Ht 5\' 4"  (1.626 m)  Wt 174 lb (78.926 kg)  BMI 29.87 kg/m2  SpO2 95% Physical Exam  VS noted Constitutional: Pt is oriented to person, place, and time. Appears well-developed and well-nourished.  HENT:  Head: Normocephalic and atraumatic.  Right Ear: External ear normal.  Left Ear: External ear normal.  Nose: Nose normal.  Mouth/Throat: Oropharynx is clear and moist.  Eyes: Conjunctivae and EOM are normal. Pupils are equal, round, and reactive to light.  Neck: Normal range of motion. Neck supple. No JVD present. No tracheal deviation present.  Cardiovascular: Normal rate, regular rhythm, normal heart sounds and intact distal pulses.   Pulmonary/Chest: Effort normal and breath sounds normal.  Abdominal: Soft. Bowel sounds are normal. There is no tenderness.  Musculoskeletal: Normal range of motion. Exhibits  no edema.  Lymphadenopathy:  Has no cervical adenopathy.  Neurological: Pt is alert and oriented to person, place, and time. Pt has normal reflexes. No cranial nerve deficit.  Skin: Skin is warm and dry. No rash noted. except for mult erythem lesions itchy to  Arms and legs Psychiatric:  Has  normal mood and affect. Behavior is normal.     Assessment & Plan:

## 2013-01-12 ENCOUNTER — Other Ambulatory Visit: Payer: Self-pay | Admitting: Internal Medicine

## 2013-01-15 NOTE — Telephone Encounter (Signed)
Faxed hardcopy to pharmacy. 

## 2013-01-15 NOTE — Telephone Encounter (Signed)
Done hardcopy to robin  

## 2013-01-16 ENCOUNTER — Other Ambulatory Visit: Payer: Self-pay | Admitting: Internal Medicine

## 2013-02-20 ENCOUNTER — Other Ambulatory Visit: Payer: Self-pay | Admitting: Internal Medicine

## 2013-04-11 ENCOUNTER — Telehealth: Payer: Self-pay | Admitting: Internal Medicine

## 2013-04-11 NOTE — Telephone Encounter (Signed)
Patient Information:  Caller Name: [facility]  Phone: [phone]  Patient: [name] Gross, [name] Gross  Gender: Male  DOB: May 27, 1961  Age: 52 Years  PCP: Oliver Barre (Adults only)  Office Follow Up:  Does the office need to follow up with this patient?: No  Instructions For The Office: N/A  RN Note:  Offered to schedule with another provider on 04/11/13, but patient requests to see Dr. Jonny Ruiz regarding these symptoms.  Symptoms  Reason For Call & Symptoms: Reports muscle pain/cramping all over body. Chills, sweating more than normal. Reports he felt the same way when he was diagnosed with [facility] Fever approximately 2 years ago. Patient requesting appointment.  Reviewed Health History In EMR: Yes  Reviewed Medications In EMR: Yes  Reviewed Allergies In EMR: Yes  Reviewed Surgeries / Procedures: Yes  Date of Onset of Symptoms: 04/10/2013  Treatments Tried: Tramadol  Treatments Tried Worked: No  Guideline(s) Used:  No Protocol Available - Sick Adult  Disposition Per Guideline:   See Today or Tomorrow in Office  Reason For Disposition Reached:   Nursing judgment  Advice Given:  N/A  Patient Will Follow Care Advice:  YES  Appointment Scheduled:  04/12/2013 11:00:00 Appointment Scheduled Provider:  Oliver Barre (Adults only)

## 2013-04-11 NOTE — Telephone Encounter (Signed)
[name] Gross to offer appt to pt next available

## 2013-04-12 ENCOUNTER — Encounter: Payer: Self-pay | Admitting: Internal Medicine

## 2013-04-12 ENCOUNTER — Ambulatory Visit (INDEPENDENT_AMBULATORY_CARE_PROVIDER_SITE_OTHER): Payer: BC Managed Care – PPO | Admitting: Internal Medicine

## 2013-04-12 ENCOUNTER — Other Ambulatory Visit (INDEPENDENT_AMBULATORY_CARE_PROVIDER_SITE_OTHER): Payer: BC Managed Care – PPO

## 2013-04-12 VITALS — BP 110/80 | HR 84 | Temp 97.9°F | Ht 64.0 in | Wt 165.4 lb

## 2013-04-12 DIAGNOSIS — M791 Myalgia, unspecified site: Secondary | ICD-10-CM | POA: Insufficient documentation

## 2013-04-12 DIAGNOSIS — I10 Essential (primary) hypertension: Secondary | ICD-10-CM

## 2013-04-12 DIAGNOSIS — R7309 Other abnormal glucose: Secondary | ICD-10-CM

## 2013-04-12 DIAGNOSIS — IMO0001 Reserved for inherently not codable concepts without codable children: Secondary | ICD-10-CM

## 2013-04-12 DIAGNOSIS — R7302 Impaired glucose tolerance (oral): Secondary | ICD-10-CM

## 2013-04-12 LAB — BASIC METABOLIC PANEL
BUN: 13 mg/dL (ref 6–23)
CO2: 30 mEq/L (ref 19–32)
Calcium: 10 mg/dL (ref 8.4–10.5)
GFR: 63.85 mL/min (ref >60.00)
Glucose, Bld: 95 mg/dL (ref 70–99)

## 2013-04-12 LAB — CBC WITH DIFFERENTIAL/PLATELET
Basophils Absolute: 0 10*3/uL (ref 0.0–0.1)
Eosinophils Relative: 0.9 % (ref 0.0–5.0)
HCT: 53.7 % — ABNORMAL HIGH (ref 39.0–52.0)
Hemoglobin: 17.6 g/dL — ABNORMAL HIGH (ref 13.0–17.0)
Lymphocytes Relative: 15.6 % (ref 12.0–46.0)
Lymphs Abs: 1.1 10*3/uL (ref 0.7–4.0)
Monocytes Relative: 6.1 % (ref 3.0–12.0)
Platelets: 278 10*3/uL (ref 150.0–400.0)
WBC: 6.8 10*3/uL (ref 4.5–10.5)

## 2013-04-12 LAB — HEPATIC FUNCTION PANEL
AST: 29 U/L (ref 0–37)
Albumin: 4.8 g/dL (ref 3.5–5.2)
Total Protein: 8.5 g/dL — ABNORMAL HIGH (ref 6.0–8.3)

## 2013-04-12 LAB — C-REACTIVE PROTEIN: CRP: 1.9 mg/dL (ref 0.5–20.0)

## 2013-04-12 LAB — TSH: TSH: 1.84 u[IU]/mL (ref 0.35–5.50)

## 2013-04-12 MED ORDER — DOXYCYCLINE HYCLATE 100 MG PO TABS: 100.0000 mg | ORAL_TABLET | Freq: Two times a day (BID) | ORAL | Status: DC

## 2013-04-12 NOTE — Patient Instructions (Addendum)
Please take all new medication as prescribed Please continue all other medications as before Please go to the LAB in the Basement (turn left off the elevator) for the tests to be done today You will be contacted by phone if any changes need to be made immediately.  Otherwise, you will receive a letter about your results with an explanation, but please check with MyChart first. Thank you for enrolling in MyChart. Please follow the instructions below to securely access your online medical record. MyChart allows you to send messages to your doctor, view your test results, renew your prescriptions, schedule appointments, and more.

## 2013-04-12 NOTE — Assessment & Plan Note (Signed)
stable overall by history and exam, recent data reviewed with pt, and pt to continue medical treatment as before,  to f/u any worsening symptoms or concerns BP Readings from Last 3 Encounters:  04/12/13 110/80  11/20/12 110/80  08/09/12 139/89

## 2013-04-12 NOTE — Progress Notes (Signed)
Subjective:    Patient ID: [name] Gross, male    DOB: 01-Oct-1961, 52 y.o.   MRN: [id number]  HPI  Here to f/u, with 2 days onset rather severe diffuse myalgias, general weakness and malaise, with feeling warm, several chills, with HA, sweats. No high temp documented, rash, swelling, joint pain, ST, cough, and Pt denies chest pain, increased sob or doe, wheezing, orthopnea, PND, increased LE swelling, palpitations, dizziness or syncope.  Denies urinary symptoms such as dysuria, frequency, urgency, flank pain, hematuria or n/v, fever, chills.  Spends a lot of time outdoors, no clear hx of tick bite, symptoms to him similar to documented RMSF tx approx 2 yrs ago. No polydipsia, polyuria Past Medical History  Diagnosis Date  . CAD (coronary artery disease)     s/p INF STEMI 12/04/2010: treated with BMS to RCA c/b acute stent thrombosis at completion of  primary PCI ...above tx with second BMS with adjunctive thrombectomy residual CAD at cath 12/04/2010: pRCA 40-50%; LAD and CFX ok; EF 45% Echo 12/06/2010: Ef 55-65%; ?HK inferior wall; mild MR  . Glucose intolerance (impaired glucose tolerance)   . Asthma     mild intermittent  . GERD (gastroesophageal reflux disease)   . HTN (hypertension)   . Obesity   . Anxiety   . Allergic rhinitis   . Osteoarthritis   . Erectile dysfunction   . OSA (obstructive sleep apnea)     mild, no CPAP per pt preference  . Prostate cancer    Past Surgical History  Procedure Laterality Date  . Coronary stent placement      Stenting of the right coronary artery......... Successful percutaneous coronary intervention with  adjunctive thrombectomy for treatment of acute stent thrombosis and   reocclusion of the right coronary artery after treatment earlier today  of an acute myocardial infarction  . Nasal sinus surgery    . Knee surgery    . Spermatocelectomy    . Wrist surgery    . Elbow surgery      reports that he has quit smoking. His smoking use included  Cigarettes. He smoked 0.00 packs per day. He has never used smokeless tobacco. He reports that he does not drink alcohol or use illicit drugs. family history includes Breast cancer in his maternal aunt; Coronary artery disease in his father; Diabetes in his father; Esophageal cancer in his mother; Heart disease in his father, maternal aunt, maternal grandfather, maternal grandmother, maternal uncle, and mother; Hip fracture in his father; and Liver cancer in his maternal uncle.  There is no history of Colon cancer, and Rectal cancer, and Stomach cancer, . Allergies  Allergen Reactions  . Amoxicillin     Per the pt "it was years ago and does not remember the reaction"   Current Outpatient Prescriptions on File Prior to Visit  Medication Sig Dispense Refill  . ALPRAZolam (XANAX) 0.5 MG tablet TAKE 1 TABLET BY MOUTH AT BEDTIME AS NEEDED  30 tablet  2  . aspirin 81 MG tablet Take 81 mg by mouth daily.        Marland Kitchen atorvastatin (LIPITOR) 20 MG tablet TAKE 1 TABLET (20 MG TOTAL) BY MOUTH DAILY.  30 tablet  6  . escitalopram (LEXAPRO) 10 MG tablet TAKE 1 TABLET EVERY DAY  90 tablet  0  . famotidine (PEPCID) 20 MG tablet Take 1 tablet (20 mg total) by mouth 2 (two) times daily.  60 tablet  6  . fluocinonide cream (LIDEX) 0.05 % Apply topically 2 (  two) times daily.  30 g  1  . halobetasol (ULTRAVATE) 0.05 % cream APPLY TO AFFECTED AREA 3 TIMES A DAY  50 g  0  . nitroGLYCERIN (NITROSTAT) 0.4 MG SL tablet Place 0.4 mg under the tongue every 5 (five) minutes as needed.        . traMADol (ULTRAM) 50 MG tablet TAKE 1 TABLET TWICE A DAY AS NEEDED  60 tablet  3  . triamcinolone cream (KENALOG) 0.1 % APPLY TOPICALLY 2 (TWO) TIMES DAILY.  30 g  8  . fexofenadine (ALLEGRA) 180 MG tablet Take 180 mg by mouth as needed.       No current facility-administered medications on file prior to visit.   Review of Systems  Constitutional: Negative for unexpected weight change, or unusual diaphoresis  HENT: Negative for  tinnitus.   Eyes: Negative for photophobia and visual disturbance.  Respiratory: Negative for choking and stridor.   Gastrointestinal: Negative for vomiting and blood in stool.  Genitourinary: Negative for hematuria and decreased urine volume.  Musculoskeletal: Negative for acute joint swelling Skin: Negative for color change and wound.  Neurological: Negative for tremors and numbness other than noted  Psychiatric/Behavioral: Negative for decreased concentration or  hyperactivity.       Objective:   Physical Exam BP 110/80  Pulse 84  Temp(Src) 97.9 F (36.6 C) (Oral)  Ht 5\' 4"  (1.626 m)  Wt 165 lb 6 oz (75.014 kg)  BMI 28.37 kg/m2  SpO2 95% VS noted, mild ill Constitutional: Pt appears well-developed and well-nourished. Lavella Lemons HENT: Head: NCAT.  Right Ear: External ear normal.  Left Ear: External ear normal.  Bilat tm's with mild erythema.  Max sinus areas non tender.  Pharynx with mild erythema, no exudate Eyes: Conjunctivae and EOM are normal. Pupils are equal, round, and reactive to light.  Neck: Normal range of motion. Neck supple.  Cardiovascular: Normal rate and regular rhythm.   Pulmonary/Chest: Effort normal and breath sounds normal.  Abd:  Soft, NT, non-distended, + BS, no flank tender Neurological: Pt is alert. Not confused , motor intact, spine nontender Skin: Skin is warm. No erythema. No rash, no joint swelling Psychiatric: Pt behavior is normal. Thought content normal.     Assessment & Plan:

## 2013-04-12 NOTE — Telephone Encounter (Signed)
Call A Nurse scheduled the patient with Dr. Jonny Ruiz at 11:00 April 24.

## 2013-04-12 NOTE — Assessment & Plan Note (Signed)
Suspect viral illness, cannot r/o RMSF, for doxy empiric, but also labs as documented, blood cx

## 2013-04-12 NOTE — Assessment & Plan Note (Signed)
stable overall by history and exam, recent data reviewed with pt, and pt to continue medical treatment as before,  to f/u any worsening symptoms or concerns Lab Results  Component Value Date   HGBA1C 6.3 11/09/2011

## 2013-05-15 ENCOUNTER — Other Ambulatory Visit: Payer: Self-pay | Admitting: Cardiovascular Disease

## 2013-05-15 ENCOUNTER — Ambulatory Visit (INDEPENDENT_AMBULATORY_CARE_PROVIDER_SITE_OTHER): Payer: BC Managed Care – PPO | Admitting: Cardiovascular Disease

## 2013-05-15 ENCOUNTER — Encounter: Payer: Self-pay | Admitting: Cardiovascular Disease

## 2013-05-15 VITALS — BP 128/80 | HR 84 | Ht 64.0 in | Wt 167.0 lb

## 2013-05-15 DIAGNOSIS — I2510 Atherosclerotic heart disease of native coronary artery without angina pectoris: Secondary | ICD-10-CM

## 2013-05-15 DIAGNOSIS — E785 Hyperlipidemia, unspecified: Secondary | ICD-10-CM

## 2013-05-15 MED ORDER — ROSUVASTATIN CALCIUM 5 MG PO TABS: 5.0000 mg | ORAL_TABLET | Freq: Every day | ORAL | Status: DC

## 2013-05-15 NOTE — Patient Instructions (Addendum)
Your physician has recommended you make the following change in your medication: STOP Atorvastatin, START Crestor 5mg  take one by mouth daily  Your physician recommends that you return for a FASTING LIPID and LIVER profile in 3 MONTHS--nothing to eat or drink after midnight, lab opens at 7:30  Your physician wants you to follow-up in: 1 YEAR with Dr Excell Seltzer.  You will receive a reminder letter in the mail two months in advance. If you don't receive a letter, please call our office to schedule the follow-up appointment.

## 2013-05-15 NOTE — Progress Notes (Signed)
HPI:   52 year old gentleman presenting for followup of coronary artery disease. He initially presented in 2011 with an inferior wall ST elevation MI. He was treated with a bare-metal stenting of the right coronary artery. He had acute stent thrombosis and a second stent had to be placed. He's done well since that time. His left ventricular function is preserved with an ejection fraction of 55-65%.  The patient is doing okay. He's not exercising regularly, but he denies any exertional symptoms. He specifically denies chest pain or pressure, dyspnea, palpitations, lightheadedness, or syncope. He doesn't feel as well since she's been back on a statin drug. He states that he felt "30 years younger" when he was off of Lipitor. He complains of myalgias and fatigue. He otherwise has no specific complaints today.  Outpatient Encounter Prescriptions as of 05/15/2013  Medication Sig Dispense Refill  . ALPRAZolam (XANAX) 0.5 MG tablet TAKE 1 TABLET BY MOUTH AT BEDTIME AS NEEDED  30 tablet  2  . aspirin 81 MG tablet Take 81 mg by mouth daily.        Marland Kitchen atorvastatin (LIPITOR) 20 MG tablet TAKE 1 TABLET (20 MG TOTAL) BY MOUTH DAILY.  30 tablet  6  . escitalopram (LEXAPRO) 10 MG tablet TAKE 1 TABLET EVERY DAY  90 tablet  0  . nitroGLYCERIN (NITROSTAT) 0.4 MG SL tablet Place 0.4 mg under the tongue every 5 (five) minutes as needed.        . traMADol (ULTRAM) 50 MG tablet TAKE 1 TABLET TWICE A DAY AS NEEDED  60 tablet  3  . [DISCONTINUED] atorvastatin (LIPITOR) 20 MG tablet TAKE 1 TABLET (20 MG TOTAL) BY MOUTH DAILY.  30 tablet  6  . [DISCONTINUED] doxycycline (VIBRA-TABS) 100 MG tablet Take 1 tablet (100 mg total) by mouth 2 (two) times daily.  20 tablet  0  . [DISCONTINUED] famotidine (PEPCID) 20 MG tablet Take 1 tablet (20 mg total) by mouth 2 (two) times daily.  60 tablet  6  . [DISCONTINUED] fexofenadine (ALLEGRA) 180 MG tablet Take 180 mg by mouth as needed.      . [DISCONTINUED] fluocinonide cream (LIDEX)  0.05 % Apply topically 2 (two) times daily.  30 g  1  . [DISCONTINUED] halobetasol (ULTRAVATE) 0.05 % cream APPLY TO AFFECTED AREA 3 TIMES A DAY  50 g  0  . [DISCONTINUED] triamcinolone cream (KENALOG) 0.1 % APPLY TOPICALLY 2 (TWO) TIMES DAILY.  30 g  8   No facility-administered encounter medications on file as of 05/15/2013.    Allergies  Allergen Reactions  . Amoxicillin     Per the pt "it was years ago and does not remember the reaction"    Past Medical History  Diagnosis Date  . CAD (coronary artery disease)     s/p INF STEMI 12/04/2010: treated with BMS to RCA c/b acute stent thrombosis at completion of  primary PCI ...above tx with second BMS with adjunctive thrombectomy residual CAD at cath 12/04/2010: pRCA 40-50%; LAD and CFX ok; EF 45% Echo 12/06/2010: Ef 55-65%; ?HK inferior wall; mild MR  . Glucose intolerance (impaired glucose tolerance)   . Asthma     mild intermittent  . GERD (gastroesophageal reflux disease)   . HTN (hypertension)   . Obesity   . Anxiety   . Allergic rhinitis   . Osteoarthritis   . Erectile dysfunction   . OSA (obstructive sleep apnea)     mild, no CPAP per pt preference  . Prostate cancer  ROS: Negative except as per HPI  BP 128/80  Pulse 84  Ht 5\' 4"  (1.626 m)  Wt 75.751 kg (167 lb)  BMI 28.65 kg/m2  PHYSICAL EXAM: Pt is alert and oriented, NAD HEENT: normal Neck: JVP - normal, carotids 2+= without bruits Lungs: CTA bilaterally CV: RRR without murmur or gallop Abd: soft, NT, Positive BS, no hepatomegaly Ext: no C/C/E, distal pulses intact and equal Skin: warm/dry no rash  EKG:  Normal sinus rhythm 73 beats per minute, age indeterminate inferior infarct, otherwise within normal limits the  ASSESSMENT AND PLAN: 1. Coronary artery disease, native vessel. The patient is stable without anginal symptoms. He will remain on his current medical program. His dyspnea resolved last year after he stopped brilinta. He has no exertional  complaints. He will remain on long-term aspirin at low dose.  2. Hyperlipidemia. The patient is having myalgias, likely related to atorvastatin. Will discontinue this and start him on Crestor 5 mg daily. Will check lipids and LFTs in 3 months.  For followup I will see him back in 12 months.  Tonny Bollman 05/15/2013 5:45 PM

## 2013-06-04 ENCOUNTER — Other Ambulatory Visit: Payer: Self-pay | Admitting: Cardiovascular Disease

## 2013-06-04 ENCOUNTER — Other Ambulatory Visit: Payer: Self-pay | Admitting: Internal Medicine

## 2013-06-13 ENCOUNTER — Other Ambulatory Visit: Payer: Self-pay | Admitting: *Deleted

## 2013-06-13 ENCOUNTER — Telehealth: Payer: Self-pay | Admitting: Cardiovascular Disease

## 2013-06-13 DIAGNOSIS — E785 Hyperlipidemia, unspecified: Secondary | ICD-10-CM

## 2013-06-13 DIAGNOSIS — I2510 Atherosclerotic heart disease of native coronary artery without angina pectoris: Secondary | ICD-10-CM

## 2013-06-13 MED ORDER — ROSUVASTATIN CALCIUM 10 MG PO TABS: 5.0000 mg | ORAL_TABLET | Freq: Every day | ORAL | Status: DC

## 2013-06-13 NOTE — Telephone Encounter (Signed)
Done

## 2013-06-13 NOTE — Telephone Encounter (Signed)
New Prob     Pt requesting his prescription CRESTOR be re-written. Pt states the dosage needs to be changed.

## 2013-07-17 ENCOUNTER — Other Ambulatory Visit: Payer: Self-pay | Admitting: Internal Medicine

## 2013-07-17 NOTE — Telephone Encounter (Signed)
Faxed hardcopy to [facility] [address] [place] 

## 2013-07-17 NOTE — Telephone Encounter (Signed)
Done hardcopy to robin  

## 2013-08-14 ENCOUNTER — Other Ambulatory Visit (INDEPENDENT_AMBULATORY_CARE_PROVIDER_SITE_OTHER): Payer: BC Managed Care – PPO

## 2013-08-14 DIAGNOSIS — E785 Hyperlipidemia, unspecified: Secondary | ICD-10-CM

## 2013-08-14 DIAGNOSIS — I2510 Atherosclerotic heart disease of native coronary artery without angina pectoris: Secondary | ICD-10-CM

## 2013-08-14 LAB — HEPATIC FUNCTION PANEL
ALT: 25 U/L (ref 0–53)
AST: 23 U/L (ref 0–37)
Albumin: 4 g/dL (ref 3.5–5.2)
Total Protein: 6.6 g/dL (ref 6.0–8.3)

## 2013-08-14 LAB — LIPID PANEL
Cholesterol: 120 mg/dL (ref 0–200)
Total CHOL/HDL Ratio: 3
Triglycerides: 240 mg/dL — ABNORMAL HIGH (ref 0.0–149.0)

## 2013-08-14 LAB — LDL CHOLESTEROL, DIRECT: Direct LDL: 48.4 mg/dL

## 2013-09-02 ENCOUNTER — Other Ambulatory Visit: Payer: Self-pay | Admitting: Internal Medicine

## 2013-09-03 NOTE — Telephone Encounter (Signed)
Ok to refill? Last OV 4.24.14 Last filled 1.28.14

## 2013-09-04 NOTE — Telephone Encounter (Signed)
[facility].../lmb 

## 2013-09-04 NOTE — Telephone Encounter (Signed)
Done hardcopy to robin  

## 2013-10-25 ENCOUNTER — Other Ambulatory Visit: Payer: Self-pay

## 2013-12-24 ENCOUNTER — Other Ambulatory Visit: Payer: Self-pay | Admitting: Cardiovascular Disease

## 2014-01-01 ENCOUNTER — Encounter: Payer: Self-pay | Admitting: Internal Medicine

## 2014-02-13 ENCOUNTER — Telehealth: Payer: Self-pay | Admitting: Cardiovascular Disease

## 2014-02-13 NOTE — Telephone Encounter (Signed)
New message  Patient feels as if his heart is beating in his throat. This has been going on for about a year off/on. Yesterday it scared him so bad hr almost took a nitroglycerin. Please call and advise.

## 2014-02-13 NOTE — Telephone Encounter (Signed)
Spoke with patient who states he has episodes where he feels that his heart is beating in his throat which is usually cleared with coughing, however patient states yesterday he had an occurrence which was not relieved with cough.  Patient has NTG but did not take.  Patient states he was driving at the time of this episode having just left work and felt heart racing, feeling like it was beating in his throat, and difficulty swallowing.  Patient states he checked his BP when he got home and it was normal but HR was 97.  Patient states his heart rate is usually around 60 bpm.  Patient states he cannot determine whether or not he experiences SOB with these episodes.  Patient states he did experience some pain which radiated into his left shoulder and c/o nausea at the time of event.  Patient denies diaphoresis with event.  I advised patient that Dr. Burt Knack is out of the office until Monday.  I scheduled patient for appointment Monday at 10:30 as the office is closed tomorrow and possibly Friday due to weather.  I advised patient that if he has another episode like this to take NTG and to call 911.  Patient verbalized understanding and agreement.

## 2014-02-18 ENCOUNTER — Encounter: Payer: Self-pay | Admitting: Cardiovascular Disease

## 2014-02-18 ENCOUNTER — Ambulatory Visit (INDEPENDENT_AMBULATORY_CARE_PROVIDER_SITE_OTHER): Payer: BC Managed Care – PPO | Admitting: Cardiovascular Disease

## 2014-02-18 VITALS — BP 132/82 | HR 77 | Ht 64.0 in | Wt 176.0 lb

## 2014-02-18 DIAGNOSIS — R0602 Shortness of breath: Secondary | ICD-10-CM

## 2014-02-18 DIAGNOSIS — I10 Essential (primary) hypertension: Secondary | ICD-10-CM

## 2014-02-18 DIAGNOSIS — E785 Hyperlipidemia, unspecified: Secondary | ICD-10-CM

## 2014-02-18 DIAGNOSIS — R002 Palpitations: Secondary | ICD-10-CM

## 2014-02-18 DIAGNOSIS — I2510 Atherosclerotic heart disease of native coronary artery without angina pectoris: Secondary | ICD-10-CM

## 2014-02-18 NOTE — Progress Notes (Signed)
HPI:   53 year old gentleman presenting for followup of coronary artery disease. He initially presented in 2011 with an inferior wall ST elevation MI. He was treated with a bare-metal stenting of the right coronary artery. He had acute stent thrombosis and a second stent had to be placed. He's done well since that time. His left ventricular function is preserved with an ejection fraction of 55-65%.  Last lipids 08/14/2013: Chol 120, trig 240, HDL 41, LDL 48. LFT's within normal limits.  The patient has been having increasing tachypalpitations. He feels his heart racing up in his neck. There is associated shortness of breath. He's had no chest pain or pressure with exertion. He denies edema, orthopnea, or PND. He's had no lightheadedness or syncope. He was previously intolerant to atorvastatin, but is tolerating Crestor at low dose without symptoms of myalgias.   Outpatient Encounter Prescriptions as of 02/18/2014  Medication Sig  . aspirin 81 MG tablet Take 81 mg by mouth daily.    Marland Kitchen escitalopram (LEXAPRO) 10 MG tablet TAKE 1 TABLET EVERY DAY  . nitroGLYCERIN (NITROSTAT) 0.4 MG SL tablet Place 0.4 mg under the tongue every 5 (five) minutes as needed.    . rosuvastatin (CRESTOR) 10 MG tablet Take 0.5 tablets (5 mg total) by mouth daily.  . traMADol (ULTRAM) 50 MG tablet TAKE 1 TABLET TWICE A DAY AS NEEDED  . [DISCONTINUED] ALPRAZolam (XANAX) 0.5 MG tablet TAKE 1 TABLET BY MOUTH AT BEDTIME AS NEEDED  . [DISCONTINUED] atorvastatin (LIPITOR) 20 MG tablet TAKE 1 TABLET (20 MG TOTAL) BY MOUTH DAILY.    Allergies  Allergen Reactions  . Amoxicillin     Per the pt "it was years ago and does not remember the reaction"    Past Medical History  Diagnosis Date  . CAD (coronary artery disease)     s/p INF STEMI 12/04/2010: treated with BMS to RCA c/b acute stent thrombosis at completion of  primary PCI ...above tx with second BMS with adjunctive thrombectomy residual CAD at cath 12/04/2010: pRCA  40-50%; LAD and CFX ok; EF 45% Echo 12/06/2010: Ef 55-65%; ?HK inferior wall; mild MR  . Glucose intolerance (impaired glucose tolerance)   . Asthma     mild intermittent  . GERD (gastroesophageal reflux disease)   . HTN (hypertension)   . Obesity   . Anxiety   . Allergic rhinitis   . Osteoarthritis   . Erectile dysfunction   . OSA (obstructive sleep apnea)     mild, no CPAP per pt preference  . Prostate cancer     ROS: Negative except as per HPI  BP 132/82  Pulse 77  Ht 5\' 4"  (1.626 m)  Wt 176 lb (79.833 kg)  BMI 30.20 kg/m2  PHYSICAL EXAM: Pt is alert and oriented, NAD HEENT: normal Neck: JVP - normal, carotids 2+= without bruits Lungs: CTA bilaterally CV: RRR without murmur or gallop Abd: soft, NT, Positive BS, no hepatomegaly Ext: no C/C/E, distal pulses intact and equal Skin: warm/dry no rash  EKG:  Normal sinus rhythm 77 beats per minute, inferior infarct age undetermined. No significant change from previous tracing.  ASSESSMENT AND PLAN: 1. CAD, native vessel: Appears stable without typical symptoms of angina. Continue on aspirin and Crestor.  2. Hyperlipidemia: Tolerating Crestor at low dose. Most recent lipids reviewed. Continue same. He was intolerant to higher dose statin drugs.  3. Tachypalpitations. Increasingly symptomatic. Long-standing symptom much more frequent now. Recommend 21 day event recorder. He's been intolerant to beta blockers.  Depending on the rhythm, may consider low-dose calcium channel blocker versus referral to EP. Will await monitor results.  For followup I will see him back in May to review his monitor and treatment plan at that time.  Sherren Mocha 02/18/2014 11:27 AM

## 2014-02-18 NOTE — Patient Instructions (Signed)
Your physician has recommended that you wear an event monitor. Event monitors are medical devices that record the heart's electrical activity. Doctors most often [place] these monitors to diagnose arrhythmias. Arrhythmias are problems with the speed or rhythm of the heartbeat. The monitor is a small, portable device. You can wear one while you do your normal daily activities. This is usually used to diagnose what is causing palpitations/syncope (passing out).  Your physician recommends that you schedule a follow-up appointment in: May 2015 with Dr. Burt Knack  Your physician recommends that you continue on your current medications as directed. Please refer to the Current Medication list given to you today.

## 2014-02-19 ENCOUNTER — Encounter: Payer: Self-pay | Admitting: *Deleted

## 2014-02-19 ENCOUNTER — Encounter (INDEPENDENT_AMBULATORY_CARE_PROVIDER_SITE_OTHER): Payer: BC Managed Care – PPO

## 2014-02-19 DIAGNOSIS — R0602 Shortness of breath: Secondary | ICD-10-CM

## 2014-02-19 DIAGNOSIS — R002 Palpitations: Secondary | ICD-10-CM

## 2014-02-19 NOTE — Progress Notes (Signed)
Patient ID: [name] Gross, male   DOB: Apr 14, 1961, 53 y.o.   MRN: [id number] E-Cardio verite 21 day cardiac event monitor applied to patient.

## 2014-02-20 MED ORDER — ROSUVASTATIN CALCIUM 5 MG PO TABS: 5.0000 mg | ORAL_TABLET | Freq: Every day | ORAL | Status: DC

## 2014-02-20 NOTE — Addendum Note (Signed)
Addended by: Cristopher Estimable on: 02/20/2014 07:48 AM   Modules accepted: Orders

## 2014-03-20 ENCOUNTER — Telehealth: Payer: Self-pay | Admitting: Nurse Practitioner

## 2014-03-20 NOTE — Telephone Encounter (Signed)
Called patient to report eCardio monitor results.  Per Dr. Burt Knack:  Sinus rhythm with PVC's.  No sustained tachycardia.  No medication required but if symptoms bothersome could try Diltiazem CD 120 mg (patient Beta-blocker intolerant).   I reviewed results with patient who verbalized understanding.  Patient states he does not feel like he needs medication at this time - states he had 1 episode where the tachypalpitations were significant, otherwise he has not been symptomatic.  Patient states he has an appointment with Dr. Burt Knack in May for follow-up. I advised patient to call with questions or concerns before that time.  Patient verbalized agreement and understanding.

## 2014-03-26 ENCOUNTER — Encounter: Payer: Self-pay | Admitting: Cardiovascular Disease

## 2014-04-01 ENCOUNTER — Other Ambulatory Visit: Payer: Self-pay | Admitting: Internal Medicine

## 2014-04-02 NOTE — Telephone Encounter (Signed)
Done hardcopy to robin  

## 2014-04-02 NOTE — Telephone Encounter (Signed)
Faxed hardcopy for Tramadol 50 mg #60 with 3 RF's to [facility]

## 2014-04-15 ENCOUNTER — Encounter: Payer: Self-pay | Admitting: Cardiovascular Disease

## 2014-04-26 ENCOUNTER — Ambulatory Visit: Payer: BC Managed Care – PPO | Admitting: Cardiovascular Disease

## 2014-05-15 ENCOUNTER — Ambulatory Visit: Payer: BC Managed Care – PPO | Admitting: Cardiovascular Disease

## 2014-05-21 ENCOUNTER — Encounter: Payer: Self-pay | Admitting: Cardiovascular Disease

## 2014-05-21 ENCOUNTER — Ambulatory Visit (INDEPENDENT_AMBULATORY_CARE_PROVIDER_SITE_OTHER): Payer: BC Managed Care – PPO | Admitting: Cardiovascular Disease

## 2014-05-21 VITALS — BP 124/81 | HR 69 | Ht 64.0 in | Wt 164.0 lb

## 2014-05-21 DIAGNOSIS — I2510 Atherosclerotic heart disease of native coronary artery without angina pectoris: Secondary | ICD-10-CM

## 2014-05-21 DIAGNOSIS — I10 Essential (primary) hypertension: Secondary | ICD-10-CM

## 2014-05-21 NOTE — Patient Instructions (Signed)
Your physician has requested that you have an exercise tolerance test in 1 YEAR with Dr Cooper. For further information please visit [email or web address]. Please also follow instruction sheet, as given.  Your physician recommends that you continue on your current medications as directed. Please refer to the Current Medication list given to you today.  

## 2014-05-21 NOTE — Progress Notes (Signed)
    HPI:  53 year old gentleman presenting for followup of coronary artery disease. He initially presented in 2011 with an inferior wall ST elevation MI. He was treated with a bare-metal stenting of the right coronary artery. He had acute stent thrombosis and a second stent had to be placed. He's done well since that time. His left ventricular function is preserved with an ejection fraction of 55-65%.  When I saw him last in March 2015 he complained of tachypalpitations. There was associated shortness of breath. An event monitor was performed and this demonstrated PVCs but no sustained arrhythmias. He presents today for followup discussion.  The patient is doing well. His palpitations have nearly resolved. He has cut caffeine and been more active with exercise. His lost weight since his last visit. He really is feeling much better. He denies chest pain or pressure, dyspnea, or edema.  Outpatient Encounter Prescriptions as of 05/21/2014  Medication Sig  . aspirin 81 MG tablet Take 81 mg by mouth daily.    Marland Kitchen escitalopram (LEXAPRO) 10 MG tablet TAKE 1 TABLET EVERY DAY  . nitroGLYCERIN (NITROSTAT) 0.4 MG SL tablet Place 0.4 mg under the tongue every 5 (five) minutes as needed.    . rosuvastatin (CRESTOR) 5 MG tablet Take 1 tablet (5 mg total) by mouth daily.  . traMADol (ULTRAM) 50 MG tablet TAKE 1 TABLET BY MOUTH TWICE A DAY AS NEEDED    Allergies  Allergen Reactions  . Amoxicillin     Per the pt "it was years ago and does not remember the reaction"    Past Medical History  Diagnosis Date  . CAD (coronary artery disease)     s/p INF STEMI 12/04/2010: treated with BMS to RCA c/b acute stent thrombosis at completion of  primary PCI ...above tx with second BMS with adjunctive thrombectomy residual CAD at cath 12/04/2010: pRCA 40-50%; LAD and CFX ok; EF 45% Echo 12/06/2010: Ef 55-65%; ?HK inferior wall; mild MR  . Glucose intolerance (impaired glucose tolerance)   . Asthma     mild intermittent    . GERD (gastroesophageal reflux disease)   . HTN (hypertension)   . Obesity   . Anxiety   . Allergic rhinitis   . Osteoarthritis   . Erectile dysfunction   . OSA (obstructive sleep apnea)     mild, no CPAP per pt preference  . Prostate cancer     BP 124/81  Pulse 69  Ht 5\' 4"  (1.626 m)  Wt 74.39 kg (164 lb)  BMI 28.14 kg/m2  PHYSICAL EXAM: Pt is alert and oriented, NAD HEENT: normal Neck: JVP - normal Lungs: CTA bilaterally CV: RRR without murmur or gallop Abd: soft, NT Ext: no C/C/E, distal pulses intact and equal Skin: warm/dry no rash  ASSESSMENT AND PLAN: 1. Tachypalpitations. Event monitor reviewed and showed no significant abnormality other than isolated PVCs. Continue with observation. If he develops more significant symptoms in the future would consider a calcium channel blocker since he is beta blocker intolerant.  2. Hyperlipidemia. The patient takes Crestor. He will be due for lipids and LFTs this fall. He plans to have that drawn with his annual lab work with Dr. Jenny Reichmann.  3. Coronary artery disease status post MI. Continue aspirin and a statin drug. Check an exercise treadmill study in one year.  Sherren Mocha, MD 05/21/2014 8:02 AM

## 2014-06-01 ENCOUNTER — Other Ambulatory Visit: Payer: Self-pay | Admitting: Internal Medicine

## 2014-06-03 ENCOUNTER — Other Ambulatory Visit: Payer: Self-pay | Admitting: Internal Medicine

## 2014-07-05 ENCOUNTER — Other Ambulatory Visit: Payer: Self-pay | Admitting: *Deleted

## 2014-07-05 MED ORDER — NITROGLYCERIN 0.4 MG SL SUBL: 0.4000 mg | SUBLINGUAL_TABLET | SUBLINGUAL | Status: DC | PRN

## 2014-07-24 ENCOUNTER — Encounter: Payer: Self-pay | Admitting: Internal Medicine

## 2014-07-25 ENCOUNTER — Encounter (HOSPITAL_COMMUNITY): Payer: Self-pay | Admitting: Cardiovascular Disease

## 2014-08-13 ENCOUNTER — Encounter: Payer: Self-pay | Admitting: Internal Medicine

## 2014-08-30 ENCOUNTER — Telehealth: Payer: Self-pay | Admitting: Internal Medicine

## 2014-08-30 ENCOUNTER — Other Ambulatory Visit: Payer: Self-pay | Admitting: Internal Medicine

## 2014-08-30 MED ORDER — TRAMADOL HCL 50 MG PO TABS: ORAL_TABLET | ORAL | Status: DC

## 2014-08-30 NOTE — Telephone Encounter (Signed)
Patient has not been seen since 03/2013 but has now scheduled appointment for 09/2014.

## 2014-08-30 NOTE — Telephone Encounter (Signed)
Faxed hardcopy to [facility] [address] [place] Tramadol

## 2014-08-30 NOTE — Telephone Encounter (Signed)
Done hardcopy to [facility]  This rx is different from one this am - has 1 refill added

## 2014-08-30 NOTE — Telephone Encounter (Signed)
Done hardcopy to robin  

## 2014-08-30 NOTE — Telephone Encounter (Signed)
Faxed hardcopy for Tramadol with the refill added to [facility] [address] [place]

## 2014-10-04 ENCOUNTER — Telehealth: Payer: Self-pay

## 2014-10-04 ENCOUNTER — Other Ambulatory Visit (INDEPENDENT_AMBULATORY_CARE_PROVIDER_SITE_OTHER): Payer: BC Managed Care – PPO

## 2014-10-04 DIAGNOSIS — R7302 Impaired glucose tolerance (oral): Secondary | ICD-10-CM

## 2014-10-04 DIAGNOSIS — Z0189 Encounter for other specified special examinations: Secondary | ICD-10-CM

## 2014-10-04 DIAGNOSIS — Z0000 Encounter for general adult medical examination without abnormal findings: Secondary | ICD-10-CM

## 2014-10-04 LAB — CBC WITH DIFFERENTIAL/PLATELET
Basophils Absolute: 0.1 10*3/uL (ref 0.0–0.1)
Basophils Relative: 1 % (ref 0.0–3.0)
EOS PCT: 5.8 % — AB (ref 0.0–5.0)
Eosinophils Absolute: 0.4 10*3/uL (ref 0.0–0.7)
HEMATOCRIT: 47 % (ref 39.0–52.0)
Hemoglobin: 15.5 g/dL (ref 13.0–17.0)
Lymphocytes Relative: 25.4 % (ref 12.0–46.0)
Lymphs Abs: 1.9 10*3/uL (ref 0.7–4.0)
MCHC: 33 g/dL (ref 30.0–36.0)
MCV: 88.3 fl (ref 78.0–100.0)
MONOS PCT: 6.6 % (ref 3.0–12.0)
Monocytes Absolute: 0.5 10*3/uL (ref 0.1–1.0)
NEUTROS PCT: 61.2 % (ref 43.0–77.0)
Neutro Abs: 4.5 10*3/uL (ref 1.4–7.7)
PLATELETS: 316 10*3/uL (ref 150.0–400.0)
RBC: 5.33 Mil/uL (ref 4.22–5.81)
RDW: 13.1 % (ref 11.5–15.5)
WBC: 7.4 10*3/uL (ref 4.0–10.5)

## 2014-10-04 LAB — URINALYSIS, ROUTINE W REFLEX MICROSCOPIC
Bilirubin Urine: NEGATIVE
Hgb urine dipstick: NEGATIVE
Ketones, ur: NEGATIVE
LEUKOCYTES UA: NEGATIVE
NITRITE: NEGATIVE
PH: 5.5 (ref 5.0–8.0)
Total Protein, Urine: NEGATIVE
Urine Glucose: NEGATIVE
Urobilinogen, UA: 0.2 (ref 0.0–1.0)

## 2014-10-04 LAB — HEPATIC FUNCTION PANEL
ALK PHOS: 75 U/L (ref 39–117)
ALT: 28 U/L (ref 0–53)
AST: 25 U/L (ref 0–37)
Albumin: 3.8 g/dL (ref 3.5–5.2)
BILIRUBIN DIRECT: 0.1 mg/dL (ref 0.0–0.3)
BILIRUBIN TOTAL: 0.7 mg/dL (ref 0.2–1.2)
Total Protein: 6.8 g/dL (ref 6.0–8.3)

## 2014-10-04 LAB — BASIC METABOLIC PANEL
BUN: 12 mg/dL (ref 6–23)
CHLORIDE: 103 meq/L (ref 96–112)
CO2: 27 mEq/L (ref 19–32)
Calcium: 10 mg/dL (ref 8.4–10.5)
Creatinine, Ser: 1.2 mg/dL (ref 0.4–1.5)
GFR: 69.16 mL/min (ref >60.00)
Glucose, Bld: 103 mg/dL — ABNORMAL HIGH (ref 70–99)
POTASSIUM: 5.4 meq/L — AB (ref 3.5–5.1)
Sodium: 141 mEq/L (ref 135–145)

## 2014-10-04 LAB — LIPID PANEL
CHOL/HDL RATIO: 4
Cholesterol: 160 mg/dL (ref 0–200)
HDL: 45.3 mg/dL (ref >39.00)
LDL Cholesterol: 84 mg/dL (ref 0–99)
NONHDL: 114.7
Triglycerides: 153 mg/dL — ABNORMAL HIGH (ref 0.0–149.0)
VLDL: 30.6 mg/dL (ref 0.0–40.0)

## 2014-10-04 LAB — TSH: TSH: 2.56 u[IU]/mL (ref 0.35–4.50)

## 2014-10-04 LAB — HEMOGLOBIN A1C: Hgb A1c MFr Bld: 6.1 % (ref 4.6–6.5)

## 2014-10-04 LAB — PSA: PSA: 0.02 ng/mL — ABNORMAL LOW (ref 0.10–4.00)

## 2014-10-04 NOTE — Telephone Encounter (Signed)
Pt has a physical scheduled with you next week. He is already down in the lab right now waiting to get lab work done but there are no orders in yet. What should the lab do? Please advise

## 2014-10-04 NOTE — Telephone Encounter (Signed)
Labs are ordered 

## 2014-10-11 ENCOUNTER — Encounter: Payer: Self-pay | Admitting: Internal Medicine

## 2014-10-11 ENCOUNTER — Ambulatory Visit (INDEPENDENT_AMBULATORY_CARE_PROVIDER_SITE_OTHER): Payer: BC Managed Care – PPO | Admitting: Internal Medicine

## 2014-10-11 VITALS — BP 132/82 | HR 82 | Temp 98.5°F | Ht 64.0 in | Wt 163.4 lb

## 2014-10-11 DIAGNOSIS — Z23 Encounter for immunization: Secondary | ICD-10-CM

## 2014-10-11 DIAGNOSIS — H6691 Otitis media, unspecified, right ear: Secondary | ICD-10-CM | POA: Insufficient documentation

## 2014-10-11 DIAGNOSIS — Z0000 Encounter for general adult medical examination without abnormal findings: Secondary | ICD-10-CM

## 2014-10-11 DIAGNOSIS — J018 Other acute sinusitis: Secondary | ICD-10-CM

## 2014-10-11 MED ORDER — LEVOFLOXACIN 250 MG PO TABS: 250.0000 mg | ORAL_TABLET | Freq: Every day | ORAL | Status: DC

## 2014-10-11 MED ORDER — TRAMADOL HCL 50 MG PO TABS: ORAL_TABLET | ORAL | Status: DC

## 2014-10-11 NOTE — Progress Notes (Signed)
Subjective:    Patient ID: [name] Gross, male    DOB: 10-08-1961, 53 y.o.   MRN: [id number]  HPI  Here for wellness and f/u;  Overall doing ok;  Pt denies CP, worsening SOB, DOE, wheezing, orthopnea, PND, worsening LE edema, palpitations, dizziness or syncope.  Pt denies neurological change such as new headache, facial or extremity weakness.  Pt denies polydipsia, polyuria, or low sugar symptoms. Pt states overall good compliance with treatment and medications, good tolerability, and has been trying to follow lower cholesterol diet.  Pt denies worsening depressive symptoms, suicidal ideation or panic. No fever, night sweats, wt loss, loss of appetite, or other constitutional symptoms.  Pt states good ability with ADL's, has low fall risk, home safety reviewed and adequate, no other significant changes in hearing or vision, and only occasionally active with exercise. Lost wt with more exercise. incidnetly -  Here with 2-3 days acute onset fever, facial pain, pressure, headache, general weakness and malaise, and greenish d/c, with mild ST.  Wt Readings from Last 3 Encounters:  10/11/14 163 lb 6 oz (74.106 kg)  05/21/14 164 lb (74.39 kg)  02/18/14 176 lb (79.833 kg)   Past Medical History  Diagnosis Date  . CAD (coronary artery disease)     s/p INF STEMI 12/04/2010: treated with BMS to RCA c/b acute stent thrombosis at completion of  primary PCI ...above tx with second BMS with adjunctive thrombectomy residual CAD at cath 12/04/2010: pRCA 40-50%; LAD and CFX ok; EF 45% Echo 12/06/2010: Ef 55-65%; ?HK inferior wall; mild MR  . Glucose intolerance (impaired glucose tolerance)   . Asthma     mild intermittent  . GERD (gastroesophageal reflux disease)   . HTN (hypertension)   . Obesity   . Anxiety   . Allergic rhinitis   . Osteoarthritis   . Erectile dysfunction   . OSA (obstructive sleep apnea)     mild, no CPAP per pt preference  . Prostate cancer    Past Surgical History  Procedure  Laterality Date  . Coronary stent placement      Stenting of the right coronary artery......... Successful percutaneous coronary intervention with  adjunctive thrombectomy for treatment of acute stent thrombosis and   reocclusion of the right coronary artery after treatment earlier today  of an acute myocardial infarction  . Nasal sinus surgery    . Knee surgery    . Spermatocelectomy    . Wrist surgery    . Elbow surgery      reports that he has quit smoking. His smoking use included Cigarettes. He smoked 0.00 packs per day. He has never used smokeless tobacco. He reports that he does not drink alcohol or use illicit drugs. family history includes Breast cancer in his maternal aunt; Coronary artery disease in his father; Diabetes in his father; Esophageal cancer in his mother; Heart disease in his father, maternal aunt, maternal grandfather, maternal grandmother, maternal uncle, and mother; Hip fracture in his father; Liver cancer in his maternal uncle. There is no history of Colon cancer, Rectal cancer, or Stomach cancer. Allergies  Allergen Reactions  . Amoxicillin     Per the pt "it was years ago and does not remember the reaction"   Current Outpatient Prescriptions on File Prior to Visit  Medication Sig Dispense Refill  . aspirin 81 MG tablet Take 81 mg by mouth daily.        Marland Kitchen escitalopram (LEXAPRO) 10 MG tablet TAKE 1 TABLET EVERY DAY  30 tablet  3  . escitalopram (LEXAPRO) 10 MG tablet TAKE 1 TABLET EVERY DAY  30 tablet  0  . nitroGLYCERIN (NITROSTAT) 0.4 MG SL tablet Place 1 tablet (0.4 mg total) under the tongue every 5 (five) minutes as needed.  25 tablet  3  . rosuvastatin (CRESTOR) 5 MG tablet Take 1 tablet (5 mg total) by mouth daily.  90 tablet  3  . traMADol (ULTRAM) 50 MG tablet TAKE 1 TABLET BY MOUTH TWICE A DAY AS NEEDED  60 tablet  1   No current facility-administered medications on file prior to visit.   Review of Systems Constitutional: Negative for increased  diaphoresis, other activity, appetite or other siginficant weight change  HENT: Negative for worsening hearing loss, ear pain, facial swelling, mouth sores and neck stiffness.   Eyes: Negative for other worsening pain, redness or visual disturbance.  Respiratory: Negative for shortness of breath and wheezing.   Cardiovascular: Negative for chest pain and palpitations.  Gastrointestinal: Negative for diarrhea, blood in stool, abdominal distention or other pain Genitourinary: Negative for hematuria, flank pain or change in urine volume.  Musculoskeletal: Negative for myalgias or other joint complaints.  Skin: Negative for color change and wound.  Neurological: Negative for syncope and numbness. other than noted Hematological: Negative for adenopathy. or other swelling Psychiatric/Behavioral: Negative for hallucinations, self-injury, decreased concentration or other worsening agitation.      Objective:   Physical Exam BP 132/82  Pulse 82  Temp(Src) 98.5 F (36.9 C) (Oral)  Ht 5\' 4"  (1.626 m)  Wt 163 lb 6 oz (74.106 kg)  BMI 28.03 kg/m2  SpO2 96% VS noted, mild ill Constitutional: Pt is oriented to person, place, and time. Appears well-developed and well-nourished.  Head: Normocephalic and atraumatic.  Right Ear: External ear normal.  Left Ear: External ear normal.  Nose: Nose normal.  Mouth/Throat: Oropharynx is clear and moist.  Bilat tm's with mild erythema.  Max sinus areas mild tender.  Pharynx with mild erythema, no exudate Eyes: Conjunctivae and EOM are normal. Pupils are equal, round, and reactive to light.  Neck: Normal range of motion. Neck supple. No JVD present. No tracheal deviation present.  Cardiovascular: Normal rate, regular rhythm, normal heart sounds and intact distal pulses.   Pulmonary/Chest: Effort normal and breath sounds without rales or wheezing  Abdominal: Soft. Bowel sounds are normal. NT. No HSM  Musculoskeletal: Normal range of motion. Exhibits no edema.    Lymphadenopathy:  Has no cervical adenopathy.  Neurological: Pt is alert and oriented to person, place, and time. Pt has normal reflexes. No cranial nerve deficit. Motor grossly intact Skin: Skin is warm and dry. No rash noted.  Psychiatric:  Has normal mood and affect. Behavior is normal.         Assessment & Plan:

## 2014-10-11 NOTE — Addendum Note (Signed)
Addended by: Biagio Borg on: 10/11/2014 03:34 PM   Modules accepted: Orders

## 2014-10-11 NOTE — Assessment & Plan Note (Signed)
Mild to mod, for antibx course,  to f/u any worsening symptoms or concerns 

## 2014-10-11 NOTE — Assessment & Plan Note (Signed)

## 2014-10-11 NOTE — Patient Instructions (Signed)
You had the flu shot today  Please take all new medication as prescribed - the antibiotic  Please continue all other medications as before, and refills have been done if requested - the tramaodl  Please have the pharmacy call with any other refills you may need.  Please continue your efforts at being more active, low cholesterol diet, and weight control.  You are otherwise up to date with prevention measures today.  Please keep your appointments with your specialists as you may have planned  Your Lab work was OK today  Please return in 1 year for your yearly visit, or sooner if needed, with Lab testing done 3-5 days before

## 2014-10-11 NOTE — Progress Notes (Signed)
Pre visit review using our clinic review tool, if applicable. No additional management support is needed unless otherwise documented below in the visit note. 

## 2014-11-02 ENCOUNTER — Encounter (HOSPITAL_COMMUNITY): Payer: Self-pay | Admitting: Cardiovascular Disease

## 2014-11-04 ENCOUNTER — Other Ambulatory Visit: Payer: Self-pay

## 2014-11-04 MED ORDER — ASPIRIN 81 MG PO TABS: 81.0000 mg | ORAL_TABLET | Freq: Every day | ORAL | Status: DC

## 2014-11-06 ENCOUNTER — Encounter: Payer: Self-pay | Admitting: Cardiovascular Disease

## 2014-11-09 ENCOUNTER — Other Ambulatory Visit: Payer: Self-pay | Admitting: Internal Medicine

## 2014-11-29 ENCOUNTER — Other Ambulatory Visit: Payer: Self-pay

## 2014-11-29 MED ORDER — ESCITALOPRAM OXALATE 10 MG PO TABS: 10.0000 mg | ORAL_TABLET | Freq: Every day | ORAL | Status: DC

## 2014-12-18 ENCOUNTER — Other Ambulatory Visit: Payer: Self-pay | Admitting: Internal Medicine

## 2014-12-18 MED ORDER — TRAMADOL HCL 50 MG PO TABS: ORAL_TABLET | ORAL | Status: DC

## 2014-12-18 MED ORDER — ESCITALOPRAM OXALATE 10 MG PO TABS: 10.0000 mg | ORAL_TABLET | Freq: Every day | ORAL | Status: DC

## 2014-12-18 NOTE — Telephone Encounter (Signed)
Pt requesting a calll to discuss meds.  [phone]

## 2014-12-18 NOTE — Telephone Encounter (Signed)
The patient needs his Tramadol and Lexapro sent in to Mail order [facility].[name] KitchenMarland Gross

## 2014-12-18 NOTE — Telephone Encounter (Signed)
Ultram Done hardcopy to [facility]

## 2014-12-18 NOTE — Telephone Encounter (Signed)
Faxed hardcopy for Tramadol to [facility].

## 2014-12-23 ENCOUNTER — Encounter: Payer: Self-pay | Admitting: Nurse Practitioner

## 2014-12-23 ENCOUNTER — Ambulatory Visit (INDEPENDENT_AMBULATORY_CARE_PROVIDER_SITE_OTHER): Payer: BC Managed Care – PPO | Admitting: Nurse Practitioner

## 2014-12-23 VITALS — BP 120/60 | HR 75 | Temp 97.6°F | Ht 64.0 in | Wt 171.0 lb

## 2014-12-23 DIAGNOSIS — J069 Acute upper respiratory infection, unspecified: Secondary | ICD-10-CM

## 2014-12-23 NOTE — Progress Notes (Signed)
   Subjective:    Patient ID: [name] Gross, male    DOB: [birth year], 54 y.o.   MRN: [id number]  URI  This is a new problem. The current episode started in the past 7 days (5d). The problem has been unchanged. There has been no fever. Associated symptoms include congestion, coughing, headaches, a plugged ear sensation and a sore throat. Pertinent negatives include no chest pain, ear pain, nausea, sinus pain or wheezing. He has tried antihistamine for the symptoms. The treatment provided no relief.      Review of Systems  Constitutional: Negative for fever and fatigue.  HENT: Positive for congestion, postnasal drip and sore throat. Negative for ear pain.   Respiratory: Positive for cough. Negative for chest tightness, shortness of breath and wheezing.   Cardiovascular: Negative for chest pain.  Gastrointestinal: Negative for nausea.  Neurological: Positive for headaches.       Objective:   Physical Exam  Constitutional: He is oriented to person, place, and time. He appears well-developed and well-nourished. No distress.  HENT:  Head: Normocephalic and atraumatic.  Right Ear: External ear normal.  Left Ear: External ear normal.  Mouth/Throat: Oropharynx is clear and moist. No oropharyngeal exudate.  Nasal quality to voice  Eyes: Conjunctivae are normal. Right eye exhibits no discharge. Left eye exhibits no discharge.  Neck: Normal range of motion. Neck supple. No thyromegaly present.  Cardiovascular: Normal rate, regular rhythm and normal heart sounds.   No murmur heard. Pulmonary/Chest: Effort normal and breath sounds normal. No respiratory distress.  Lymphadenopathy:    He has no cervical adenopathy.  Neurological: He is alert and oriented to person, place, and time.  Skin: Skin is warm and dry.  Psychiatric: He has a normal mood and affect. His behavior is normal. Thought content normal.  Vitals reviewed.         Assessment & Plan:  1. Acute upper respiratory  infection Symptom management See pt instructions F/u PRN

## 2014-12-23 NOTE — Patient Instructions (Signed)
You have a cold virus causing your symptoms. The average duration of cold symptoms is 14 days. Start daily sinus rinses (neilmed Sinus Rinse). Use 30 mg to 60 mg pseudoephedrine 2 to 3 times daily. Tylenol or ibuprophen for headache. Vicks vapor rub under nose to help breathe. Benzocaine throat lozenges for sore throat. Sip fluids every hour. Rest. If you are not feeling better in 10 days or develop fever or chest pain, call us for re-evaluation. Feel better!  Upper Respiratory Infection, Adult An upper respiratory infection (URI) is also sometimes known as the common cold. The upper respiratory tract includes the nose, sinuses, throat, trachea, and bronchi. Bronchi are the airways leading to the lungs. Most people improve within 1 week, but symptoms can last up to 2 weeks. A residual cough may last even longer.  CAUSES Many different viruses can infect the tissues lining the upper respiratory tract. The tissues become irritated and inflamed and often become very moist. Mucus production is also common. A cold is contagious. You can easily spread the virus to others by oral contact. This includes kissing, sharing a glass, coughing, or sneezing. Touching your mouth or nose and then touching a surface, which is then touched by another person, can also spread the virus. SYMPTOMS  Symptoms typically develop 1 to 3 days after you come in contact with a cold virus. Symptoms vary from person to person. They may include:  Runny nose.  Sneezing.  Nasal congestion.  Sinus irritation.  Sore throat.  Loss of voice (laryngitis).  Cough.  Fatigue.  Muscle aches.  Loss of appetite.  Headache.  Low-grade fever. DIAGNOSIS  You might diagnose your own cold based on familiar symptoms, since most people get a cold 2 to 3 times a year. Your caregiver can confirm this based on your exam. Most importantly, your caregiver can check that your symptoms are not due to another disease such as strep throat,  sinusitis, pneumonia, asthma, or epiglottitis. Blood tests, throat tests, and X-rays are not necessary to diagnose a common cold, but they may sometimes be helpful in excluding other more serious diseases. Your caregiver will decide if any further tests are required. RISKS AND COMPLICATIONS  You may be at risk for a more severe case of the common cold if you smoke cigarettes, have chronic heart disease (such as heart failure) or lung disease (such as asthma), or if you have a weakened immune system. The very young and very old are also at risk for more serious infections. Bacterial sinusitis, middle ear infections, and bacterial pneumonia can complicate the common cold. The common cold can worsen asthma and chronic obstructive pulmonary disease (COPD). Sometimes, these complications can require emergency medical care and may be life-threatening. PREVENTION  The best way to protect against getting a cold is to practice good hygiene. Avoid oral or hand contact with people with cold symptoms. Wash your hands often if contact occurs. There is no clear evidence that vitamin C, vitamin E, echinacea, or exercise reduces the chance of developing a cold. However, it is always recommended to get plenty of rest and practice good nutrition. TREATMENT  Treatment is directed at relieving symptoms. There is no cure. Antibiotics are not effective, because the infection is caused by a virus, not by bacteria. Treatment may include:  Increased fluid intake. Sports drinks offer valuable electrolytes, sugars, and fluids.  Breathing heated mist or steam (vaporizer or shower).  Eating chicken soup or other clear broths, and maintaining good nutrition.  Getting plenty  of rest.  Using gargles or lozenges for comfort.  Controlling fevers with ibuprofen or acetaminophen as directed by your caregiver.  Increasing usage of your inhaler if you have asthma. Zinc gel and zinc lozenges, taken in the first 24 hours of the common  cold, can shorten the duration and lessen the severity of symptoms. Pain medicines may help with fever, muscle aches, and throat pain. A variety of non-prescription medicines are available to treat congestion and runny nose. Your caregiver can make recommendations and may suggest nasal or lung inhalers for other symptoms.  HOME CARE INSTRUCTIONS   Only take over-the-counter or prescription medicines for pain, discomfort, or fever as directed by your caregiver.  Use a warm mist humidifier or inhale steam from a shower to increase air moisture. This may keep secretions moist and make it easier to breathe.  Drink enough water and fluids to keep your urine clear or pale yellow.  Rest as needed.  Return to work when your temperature has returned to normal or as your caregiver advises. You may need to stay home longer to avoid infecting others. You can also use a face mask and careful hand washing to prevent spread of the virus. SEEK MEDICAL CARE IF:   After the first few days, you feel you are getting worse rather than better.  You need your caregiver's advice about medicines to control symptoms.  You develop chills, worsening shortness of breath, or brown or red sputum. These may be signs of pneumonia.  You develop yellow or brown nasal discharge or pain in the face, especially when you bend forward. These may be signs of sinusitis.  You develop a fever, swollen neck glands, pain with swallowing, or white areas in the back of your throat. These may be signs of strep throat. SEEK IMMEDIATE MEDICAL CARE IF:   You have a fever.  You develop severe or persistent headache, ear pain, sinus pain, or chest pain.  You develop wheezing, a prolonged cough, cough up blood, or have a change in your usual mucus (if you have chronic lung disease).  You develop sore muscles or a stiff neck. Document Released: 06/01/2001 Document Revised: 02/28/2012 Document Reviewed: 04/09/2011 [facility] Patient  Information 2014 [place], [place].

## 2014-12-23 NOTE — Progress Notes (Signed)
Pre visit review using our clinic review tool, if applicable. No additional management support is needed unless otherwise documented below in the visit note. 

## 2014-12-31 ENCOUNTER — Encounter: Payer: Self-pay | Admitting: Internal Medicine

## 2014-12-31 ENCOUNTER — Ambulatory Visit (INDEPENDENT_AMBULATORY_CARE_PROVIDER_SITE_OTHER): Payer: BC Managed Care – PPO | Admitting: Internal Medicine

## 2014-12-31 VITALS — BP 134/82 | HR 81 | Temp 98.1°F | Ht 64.0 in | Wt 171.4 lb

## 2014-12-31 DIAGNOSIS — J4521 Mild intermittent asthma with (acute) exacerbation: Secondary | ICD-10-CM

## 2014-12-31 DIAGNOSIS — J45901 Unspecified asthma with (acute) exacerbation: Secondary | ICD-10-CM | POA: Insufficient documentation

## 2014-12-31 DIAGNOSIS — J018 Other acute sinusitis: Secondary | ICD-10-CM

## 2014-12-31 DIAGNOSIS — R7302 Impaired glucose tolerance (oral): Secondary | ICD-10-CM

## 2014-12-31 MED ORDER — PREDNISONE 10 MG PO TABS: ORAL_TABLET | ORAL | Status: DC

## 2014-12-31 MED ORDER — HYDROCODONE-HOMATROPINE 5-1.5 MG/5ML PO SYRP: 5.0000 mL | ORAL_SOLUTION | Freq: Four times a day (QID) | ORAL | Status: DC | PRN

## 2014-12-31 MED ORDER — LEVOFLOXACIN 500 MG PO TABS: 500.0000 mg | ORAL_TABLET | Freq: Every day | ORAL | Status: DC

## 2014-12-31 NOTE — Progress Notes (Signed)
Pre visit review using our clinic review tool, if applicable. No additional management support is needed unless otherwise documented below in the visit note. 

## 2014-12-31 NOTE — Patient Instructions (Signed)
Please take all new medication as prescribed - the antiboitic, cough medicine, and prednisone  Please continue all other medications as before, and refills have been done if requested.  Please have the pharmacy call with any other refills you may need.  Please keep your appointments with your specialists as you may have planned

## 2014-12-31 NOTE — Progress Notes (Signed)
Subjective:    Patient ID: [name] Gross, male    DOB: December 15, 1961, 54 y.o.   MRN: [id number]  HPI  Here with acute onset mild to mod 2-3 days ST, HA, general weakness and malaise, with prod cough greenish sputum, but Pt denies chest pain, increased sob or doe, wheezing, orthopnea, PND, increased LE swelling, palpitations, dizziness or syncope, except for mild wheezing/sob since yesterday Past Medical History  Diagnosis Date  . CAD (coronary artery disease)     s/p INF STEMI 12/04/2010: treated with BMS to RCA c/b acute stent thrombosis at completion of  primary PCI ...above tx with second BMS with adjunctive thrombectomy residual CAD at cath 12/04/2010: pRCA 40-50%; LAD and CFX ok; EF 45% Echo 12/06/2010: Ef 55-65%; ?HK inferior wall; mild MR  . Glucose intolerance (impaired glucose tolerance)   . Asthma     mild intermittent  . GERD (gastroesophageal reflux disease)   . HTN (hypertension)   . Obesity   . Anxiety   . Allergic rhinitis   . Osteoarthritis   . Erectile dysfunction   . OSA (obstructive sleep apnea)     mild, no CPAP per pt preference  . Prostate cancer    Past Surgical History  Procedure Laterality Date  . Coronary stent placement      Stenting of the right coronary artery......... Successful percutaneous coronary intervention with  adjunctive thrombectomy for treatment of acute stent thrombosis and   reocclusion of the right coronary artery after treatment earlier today  of an acute myocardial infarction  . Nasal sinus surgery    . Knee surgery    . Spermatocelectomy    . Wrist surgery    . Elbow surgery      reports that he has quit smoking. His smoking use included Cigarettes. He has never used smokeless tobacco. He reports that he does not drink alcohol or use illicit drugs. family history includes Breast cancer in his maternal aunt; Coronary artery disease in his father; Diabetes in his father; Esophageal cancer in his mother; Heart disease in his father, maternal  aunt, maternal grandfather, maternal grandmother, maternal uncle, and mother; Hip fracture in his father; Liver cancer in his maternal uncle. There is no history of Colon cancer, Rectal cancer, or Stomach cancer. Allergies  Allergen Reactions  . Amoxicillin     Per the pt "it was years ago and does not remember the reaction"   Current Outpatient Prescriptions on File Prior to Visit  Medication Sig Dispense Refill  . aspirin 81 MG tablet Take 1 tablet (81 mg total) by mouth daily. 90 tablet 3  . escitalopram (LEXAPRO) 10 MG tablet Take 1 tablet (10 mg total) by mouth daily. 90 tablet 3  . nitroGLYCERIN (NITROSTAT) 0.4 MG SL tablet Place 1 tablet (0.4 mg total) under the tongue every 5 (five) minutes as needed. 25 tablet 3  . rosuvastatin (CRESTOR) 5 MG tablet Take 1 tablet (5 mg total) by mouth daily. 90 tablet 3  . traMADol (ULTRAM) 50 MG tablet TAKE 1 TABLET BY MOUTH TWICE A DAY AS NEEDED 60 tablet 2   No current facility-administered medications on file prior to visit.   Review of Systems  Constitutional: Negative for unusual diaphoresis or other sweats  HENT: Negative for ringing in ear Eyes: Negative for double vision or worsening visual disturbance.  Respiratory: Negative for choking and stridor.   Gastrointestinal: Negative for vomiting or other signifcant bowel change Genitourinary: Negative for hematuria or decreased urine volume.  Musculoskeletal:  Negative for other MSK pain or swelling Skin: Negative for color change and worsening wound.  Neurological: Negative for tremors and numbness other than noted  Psychiatric/Behavioral: Negative for decreased concentration or agitation other than above       Objective:   Physical Exam BP 134/82 mmHg  Pulse 81  Temp(Src) 98.1 F (36.7 C) (Oral)  Ht 5\' 4"  (1.626 m)  Wt 171 lb 6.4 oz (77.747 kg)  BMI 29.41 kg/m2  SpO2 97% VS noted, mild ill appearing Constitutional: Pt appears well-developed, well-nourished.  HENT: Head: NCAT.    Right Ear: External ear normal.  Left Ear: External ear normal.  Eyes: . Pupils are equal, round, and reactive to light. Conjunctivae and EOM are normal Bilat tm's with mild erythema.  Max sinus areas mild tender.  Pharynx with mild erythema, no exudate Neck: Normal range of motion. Neck supple. with bilat submandib LA Cardiovascular: Normal rate and regular rhythm.   Pulmonary/Chest: Effort normal and breath sounds without rales but with mild bilat wheezing.  Neurological: Pt is alert. Not confused , motor grossly intact Skin: Skin is warm. No rash Psychiatric: Pt behavior is normal. No agitation.     Assessment & Plan:

## 2015-01-10 NOTE — Assessment & Plan Note (Signed)
/  Mild to mod, for predpac asd,  to f/u any worsening symptoms or concerns 

## 2015-01-10 NOTE — Assessment & Plan Note (Signed)
stable overall by history and exam, recent data reviewed with pt, and pt to continue medical treatment as before,  to f/u any worsening symptoms or concerns Lab Results  Component Value Date   HGBA1C 6.1 10/04/2014   Pt to call for onset polys or cbg > 200

## 2015-01-10 NOTE — Assessment & Plan Note (Signed)
Mild to mod, for antibx course,  to f/u any worsening symptoms or concerns 

## 2015-01-11 ENCOUNTER — Encounter: Payer: Self-pay | Admitting: Internal Medicine

## 2015-01-13 MED ORDER — AZITHROMYCIN 250 MG PO TABS: ORAL_TABLET | ORAL | Status: DC

## 2015-02-04 ENCOUNTER — Encounter (HOSPITAL_COMMUNITY): Payer: Self-pay | Admitting: Cardiovascular Disease

## 2015-02-06 NOTE — Telephone Encounter (Signed)
Called patient and advised that this is okay. 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 He understands.

## 2015-03-02 ENCOUNTER — Other Ambulatory Visit: Payer: Self-pay | Admitting: Cardiovascular Disease

## 2015-03-31 ENCOUNTER — Encounter: Payer: Self-pay | Admitting: Internal Medicine

## 2015-04-01 MED ORDER — CLOBETASOL PROPIONATE 0.05 % EX CREA: 1.0000 "application " | TOPICAL_CREAM | Freq: Two times a day (BID) | CUTANEOUS | Status: DC

## 2015-04-14 ENCOUNTER — Other Ambulatory Visit (HOSPITAL_COMMUNITY): Payer: Self-pay | Admitting: Cardiovascular Disease

## 2015-04-14 DIAGNOSIS — I2510 Atherosclerotic heart disease of native coronary artery without angina pectoris: Secondary | ICD-10-CM

## 2015-05-07 ENCOUNTER — Encounter: Payer: Self-pay | Admitting: Cardiovascular Disease

## 2015-06-03 ENCOUNTER — Encounter: Payer: BC Managed Care – PPO | Admitting: Cardiovascular Disease

## 2015-06-03 ENCOUNTER — Ambulatory Visit (INDEPENDENT_AMBULATORY_CARE_PROVIDER_SITE_OTHER): Payer: BC Managed Care – PPO

## 2015-06-03 ENCOUNTER — Other Ambulatory Visit: Payer: Self-pay | Admitting: Cardiovascular Disease

## 2015-06-03 DIAGNOSIS — I2510 Atherosclerotic heart disease of native coronary artery without angina pectoris: Secondary | ICD-10-CM | POA: Diagnosis not present

## 2015-06-03 LAB — EXERCISE TOLERANCE TEST
CSEPPHR: 148 {beats}/min
Estimated workload: 10.1 METS
Exercise duration (min): 9 min
Exercise duration (sec): 22 s
MPHR: 166 {beats}/min
Percent HR: 89 %
RPE: 13
Rest HR: 75 {beats}/min

## 2015-06-07 ENCOUNTER — Other Ambulatory Visit: Payer: Self-pay | Admitting: Cardiovascular Disease

## 2015-06-10 ENCOUNTER — Other Ambulatory Visit: Payer: Self-pay

## 2015-06-10 MED ORDER — TRAMADOL HCL 50 MG PO TABS: ORAL_TABLET | ORAL | Status: DC

## 2015-06-10 NOTE — Telephone Encounter (Signed)
Rx faxed to pharmacy  

## 2015-06-10 NOTE — Telephone Encounter (Signed)
Done hardcopy to Dahlia  

## 2015-09-09 ENCOUNTER — Other Ambulatory Visit: Payer: Self-pay | Admitting: Cardiovascular Disease

## 2015-09-18 ENCOUNTER — Encounter: Payer: Self-pay | Admitting: Internal Medicine

## 2015-09-19 ENCOUNTER — Other Ambulatory Visit (INDEPENDENT_AMBULATORY_CARE_PROVIDER_SITE_OTHER): Payer: BC Managed Care – PPO

## 2015-09-19 DIAGNOSIS — Z0000 Encounter for general adult medical examination without abnormal findings: Secondary | ICD-10-CM | POA: Diagnosis not present

## 2015-09-19 DIAGNOSIS — R7989 Other specified abnormal findings of blood chemistry: Secondary | ICD-10-CM

## 2015-09-19 LAB — CBC WITH DIFFERENTIAL/PLATELET
BASOS PCT: 1.6 % (ref 0.0–3.0)
Basophils Absolute: 0.1 10*3/uL (ref 0.0–0.1)
EOS ABS: 0.4 10*3/uL (ref 0.0–0.7)
Eosinophils Relative: 5.7 % — ABNORMAL HIGH (ref 0.0–5.0)
HCT: 48.5 % (ref 39.0–52.0)
Hemoglobin: 16.2 g/dL (ref 13.0–17.0)
Lymphocytes Relative: 27.6 % (ref 12.0–46.0)
Lymphs Abs: 2.1 10*3/uL (ref 0.7–4.0)
MCHC: 33.4 g/dL (ref 30.0–36.0)
MCV: 88.7 fl (ref 78.0–100.0)
MONO ABS: 0.5 10*3/uL (ref 0.1–1.0)
Monocytes Relative: 6.8 % (ref 3.0–12.0)
NEUTROS ABS: 4.5 10*3/uL (ref 1.4–7.7)
NEUTROS PCT: 58.3 % (ref 43.0–77.0)
PLATELETS: 314 10*3/uL (ref 150.0–400.0)
RBC: 5.47 Mil/uL (ref 4.22–5.81)
RDW: 13 % (ref 11.5–15.5)
WBC: 7.7 10*3/uL (ref 4.0–10.5)

## 2015-09-19 LAB — BASIC METABOLIC PANEL
BUN: 10 mg/dL (ref 6–23)
CHLORIDE: 104 meq/L (ref 96–112)
CO2: 31 mEq/L (ref 19–32)
Calcium: 9.8 mg/dL (ref 8.4–10.5)
Creatinine, Ser: 1.21 mg/dL (ref 0.40–1.50)
GFR: 66.29 mL/min (ref >60.00)
Glucose, Bld: 100 mg/dL — ABNORMAL HIGH (ref 70–99)
POTASSIUM: 4 meq/L (ref 3.5–5.1)
Sodium: 142 mEq/L (ref 135–145)

## 2015-09-19 LAB — HEPATIC FUNCTION PANEL
ALT: 39 U/L (ref 0–53)
AST: 27 U/L (ref 0–37)
Albumin: 4.4 g/dL (ref 3.5–5.2)
Alkaline Phosphatase: 75 U/L (ref 39–117)
BILIRUBIN DIRECT: 0.1 mg/dL (ref 0.0–0.3)
BILIRUBIN TOTAL: 0.6 mg/dL (ref 0.2–1.2)
Total Protein: 6.5 g/dL (ref 6.0–8.3)

## 2015-09-19 LAB — LIPID PANEL
CHOL/HDL RATIO: 4
CHOLESTEROL: 160 mg/dL (ref 0–200)
HDL: 44.8 mg/dL (ref >39.00)
NonHDL: 115.68
Triglycerides: 335 mg/dL — ABNORMAL HIGH (ref 0.0–149.0)
VLDL: 67 mg/dL — ABNORMAL HIGH (ref 0.0–40.0)

## 2015-09-19 LAB — URINALYSIS, ROUTINE W REFLEX MICROSCOPIC
Bilirubin Urine: NEGATIVE
Hgb urine dipstick: NEGATIVE
Ketones, ur: NEGATIVE
Leukocytes, UA: NEGATIVE
Nitrite: NEGATIVE
RBC / HPF: NONE SEEN (ref 0–?)
SPECIFIC GRAVITY, URINE: 1.025 (ref 1.000–1.030)
TOTAL PROTEIN, URINE-UPE24: NEGATIVE
URINE GLUCOSE: NEGATIVE
Urobilinogen, UA: 0.2 (ref 0.0–1.0)
WBC, UA: NONE SEEN (ref 0–?)
pH: 5.5 (ref 5.0–8.0)

## 2015-09-19 LAB — TSH: TSH: 2.97 u[IU]/mL (ref 0.35–4.50)

## 2015-09-19 LAB — LDL CHOLESTEROL, DIRECT: Direct LDL: 71 mg/dL

## 2015-09-19 LAB — PSA: PSA: 0.02 ng/mL — ABNORMAL LOW (ref 0.10–4.00)

## 2015-09-23 ENCOUNTER — Encounter: Payer: Self-pay | Admitting: Internal Medicine

## 2015-10-07 ENCOUNTER — Encounter: Payer: Self-pay | Admitting: Internal Medicine

## 2015-10-07 ENCOUNTER — Ambulatory Visit (INDEPENDENT_AMBULATORY_CARE_PROVIDER_SITE_OTHER): Payer: BC Managed Care – PPO | Admitting: Internal Medicine

## 2015-10-07 VITALS — BP 118/70 | HR 90 | Temp 98.2°F | Wt 172.0 lb

## 2015-10-07 DIAGNOSIS — Z0000 Encounter for general adult medical examination without abnormal findings: Secondary | ICD-10-CM

## 2015-10-07 DIAGNOSIS — Z23 Encounter for immunization: Secondary | ICD-10-CM

## 2015-10-07 DIAGNOSIS — R7302 Impaired glucose tolerance (oral): Secondary | ICD-10-CM

## 2015-10-07 DIAGNOSIS — G4733 Obstructive sleep apnea (adult) (pediatric): Secondary | ICD-10-CM | POA: Diagnosis not present

## 2015-10-07 MED ORDER — ESCITALOPRAM OXALATE 10 MG PO TABS: 10.0000 mg | ORAL_TABLET | Freq: Every day | ORAL | Status: DC

## 2015-10-07 MED ORDER — TRAMADOL HCL 50 MG PO TABS: ORAL_TABLET | ORAL | Status: DC

## 2015-10-07 MED ORDER — ROSUVASTATIN CALCIUM 5 MG PO TABS: 5.0000 mg | ORAL_TABLET | Freq: Every day | ORAL | Status: DC

## 2015-10-07 NOTE — Progress Notes (Signed)
Pre visit review using our clinic review tool, if applicable. No additional management support is needed unless otherwise documented below in the visit note. 

## 2015-10-07 NOTE — Assessment & Plan Note (Addendum)

## 2015-10-07 NOTE — Patient Instructions (Addendum)
You had the flu shot today  Your EKG was [facility] today  Please call if you change your mind about the referral to pulmonary for sleep apnea evaluation  Please continue all other medications as before, and refills have been done if requested.  Please have the pharmacy call with any other refills you may need.  Please continue your efforts at being more active, low cholesterol diet, and weight control.  You are otherwise up to date with prevention measures today.  Please keep your appointments with your specialists as you may have planned  Please go to the LAB in the Basement (turn left off the elevator) for the tests to be done today  You will be contacted by phone if any changes need to be made immediately.  Otherwise, you will receive a letter about your results with an explanation, but please check with MyChart first.  Please remember to sign up for MyChart if you have not done so, as this will be important to you in the future with finding out test results, communicating by private email, and scheduling acute appointments online when needed.  Please return in 1 year for your yearly visit, or sooner if needed, with Lab testing done 3-5 days before

## 2015-10-07 NOTE — Addendum Note (Signed)
Addended by: Ander Slade on: 10/07/2015 03:30 PM   Modules accepted: Orders

## 2015-10-07 NOTE — Assessment & Plan Note (Signed)
stable overall by history and exam, recent data reviewed with pt, and pt to continue medical treatment as before,  to f/u any worsening symptoms or concerns Lab Results  Component Value Date   HGBA1C 6.1 10/04/2014

## 2015-10-07 NOTE — Assessment & Plan Note (Addendum)
More symtpomatic recently with recent wt gain due to less acitivity with plantar fasciitis, for f/u podiatry, incresaed activity, less calories, tramadol prn, declines pulm referral for tx

## 2015-10-07 NOTE — Progress Notes (Signed)
Subjective:    Patient ID: [name] Gross, male    DOB: 01-25-1961, 55 y.o.   MRN: [id number]  HPI  Here for wellness and f/u;  Overall doing ok;  Pt denies Chest pain, worsening SOB, DOE, wheezing, orthopnea, PND, worsening LE edema, palpitations, dizziness or syncope.  Pt denies neurological change such as new headache, facial or extremity weakness.  Pt denies polydipsia, polyuria, or low sugar symptoms. Pt states overall good compliance with treatment and medications, good tolerability, and has been trying to follow appropriate diet.  Pt denies worsening depressive symptoms, suicidal ideation or panic. No fever, night sweats, wt loss, loss of appetite, or other constitutional symptoms.  Pt states good ability with ADL's, has low fall risk, home safety reviewed and adequate, no other significant changes in hearing or vision, and only occasionally active with exercise.  Does c/o ongoing fatigue, but also with signficant daytime hypersomnolence, his 15 min power naps turned into 2-3 hrs recently.Did have sleep test previously that did show some degree OSA per pt Past Medical History  Diagnosis Date  . CAD (coronary artery disease)     s/p INF STEMI 12/04/2010: treated with BMS to RCA c/b acute stent thrombosis at completion of  primary PCI ...above tx with second BMS with adjunctive thrombectomy residual CAD at cath 12/04/2010: pRCA 40-50%; LAD and CFX ok; EF 45% Echo 12/06/2010: Ef 55-65%; ?HK inferior wall; mild MR  . Glucose intolerance (impaired glucose tolerance)   . Asthma     mild intermittent  . GERD (gastroesophageal reflux disease)   . HTN (hypertension)   . Obesity   . Anxiety   . Allergic rhinitis   . Osteoarthritis   . Erectile dysfunction   . OSA (obstructive sleep apnea)     mild, no CPAP per pt preference  . Prostate cancer [facility])    Past Surgical History  Procedure Laterality Date  . Coronary stent placement      Stenting of the right coronary artery......... Successful  percutaneous coronary intervention with  adjunctive thrombectomy for treatment of acute stent thrombosis and   reocclusion of the right coronary artery after treatment earlier today  of an acute myocardial infarction  . Nasal sinus surgery    . Knee surgery    . Spermatocelectomy    . Wrist surgery    . Elbow surgery      reports that he has quit smoking. His smoking use included Cigarettes. He has never used smokeless tobacco. He reports that he does not drink alcohol or use illicit drugs. family history includes Breast cancer in his maternal aunt; Coronary artery disease in his father; Diabetes in his father; Esophageal cancer in his mother; Heart disease in his father, maternal aunt, maternal grandfather, maternal grandmother, maternal uncle, and mother; Hip fracture in his father; Liver cancer in his maternal uncle. There is no history of Colon cancer, Rectal cancer, or Stomach cancer. Allergies  Allergen Reactions  . Amoxicillin     Per the pt "it was years ago and does not remember the reaction"   Current Outpatient Prescriptions on File Prior to Visit  Medication Sig Dispense Refill  . aspirin 81 MG tablet Take 1 tablet (81 mg total) by mouth daily. 90 tablet 3  . clobetasol cream (TEMOVATE) 5.59 % Apply 1 application topically 2 (two) times daily. 30 g 0  . CRESTOR 5 MG tablet TAKE 1 TABLET BY MOUTH EVERY DAY (Patient taking differently: TAKE 1 TABLET BY MOUTH EVERY Day-takes generic brand)  30 tablet 0  . escitalopram (LEXAPRO) 10 MG tablet Take 1 tablet (10 mg total) by mouth daily. 90 tablet 3  . nitroGLYCERIN (NITROSTAT) 0.4 MG SL tablet Place 1 tablet (0.4 mg total) under the tongue every 5 (five) minutes as needed. 25 tablet 3  . traMADol (ULTRAM) 50 MG tablet TAKE 1 TABLET BY MOUTH TWICE A DAY AS NEEDED 60 tablet 2  . azithromycin (ZITHROMAX Z-PAK) 250 MG tablet Use as directed (Patient not taking: Reported on 10/07/2015) 6 tablet 1  . CRESTOR 5 MG tablet TAKE 1 TABLET BY MOUTH  EVERY DAY (Patient not taking: Reported on 10/07/2015) 30 tablet 0  . HYDROcodone-homatropine (HYCODAN) 5-1.5 MG/5ML syrup Take 5 mLs by mouth every 6 (six) hours as needed for cough. (Patient not taking: Reported on 10/07/2015) 180 mL 0  . predniSONE (DELTASONE) 10 MG tablet 2 tabs by mouth per day for 5 days (Patient not taking: Reported on 10/07/2015) 10 tablet 0   No current facility-administered medications on file prior to visit.   Review of Systems Constitutional: Negative for increased diaphoresis, other activity, appetite or siginficant weight change other than noted HENT: Negative for worsening hearing loss, ear pain, facial swelling, mouth sores and neck stiffness.   Eyes: Negative for other worsening pain, redness or visual disturbance.  Respiratory: Negative for shortness of breath and wheezing  Cardiovascular: Negative for chest pain and palpitations.  Gastrointestinal: Negative for diarrhea, blood in stool, abdominal distention or other pain Genitourinary: Negative for hematuria, flank pain or change in urine volume.  Musculoskeletal: Negative for myalgias or other joint complaints.  Skin: Negative for color change and wound or drainage.  Neurological: Negative for syncope and numbness. other than noted Hematological: Negative for adenopathy. or other swelling Psychiatric/Behavioral: Negative for hallucinations, SI, self-injury, decreased concentration or other worsening agitation.      Objective:   Physical Exam BP 118/70 mmHg  Pulse 90  Temp(Src) 98.2 F (36.8 C)  Wt 172 lb (78.019 kg)  SpO2 97% VS noted,  Constitutional: Pt is oriented to person, place, and time. Appears well-developed and well-nourished, in no significant distress Head: Normocephalic and atraumatic.  Right Ear: External ear normal.  Left Ear: External ear normal.  Nose: Nose normal.  Mouth/Throat: Oropharynx is clear and moist.  Eyes: Conjunctivae and EOM are normal. Pupils are equal, round, and  reactive to light.  Neck: Normal range of motion. Neck supple. No JVD present. No tracheal deviation present or significant neck LA or mass Cardiovascular: Normal rate, regular rhythm, normal heart sounds and intact distal pulses.   Pulmonary/Chest: Effort normal and breath sounds without rales or wheezing  Abdominal: Soft. Bowel sounds are normal. NT. No HSM  Musculoskeletal: Normal range of motion. Exhibits no edema.  Lymphadenopathy:  Has no cervical adenopathy.  Neurological: Pt is alert and oriented to person, place, and time. Pt has normal reflexes. No cranial nerve deficit. Motor grossly intact Skin: Skin is warm and dry. No rash noted.  Psychiatric:  Has normal mood and affect. Behavior is normal.     Assessment & Plan:

## 2015-11-12 ENCOUNTER — Other Ambulatory Visit: Payer: Self-pay | Admitting: Cardiovascular Disease

## 2016-01-09 DIAGNOSIS — M722 Plantar fascial fibromatosis: Secondary | ICD-10-CM | POA: Insufficient documentation

## 2016-01-16 ENCOUNTER — Other Ambulatory Visit: Payer: Self-pay | Admitting: Internal Medicine

## 2016-02-09 ENCOUNTER — Encounter: Payer: Self-pay | Admitting: Internal Medicine

## 2016-02-09 DIAGNOSIS — Z09 Encounter for follow-up examination after completed treatment for conditions other than malignant neoplasm: Secondary | ICD-10-CM | POA: Insufficient documentation

## 2016-02-10 NOTE — Telephone Encounter (Signed)
[name] Gross to see above

## 2016-05-05 ENCOUNTER — Other Ambulatory Visit: Payer: Self-pay | Admitting: Internal Medicine

## 2016-05-06 NOTE — Telephone Encounter (Signed)
Done erx 

## 2016-05-06 NOTE — Telephone Encounter (Signed)
Please advise 

## 2016-05-06 NOTE — Telephone Encounter (Signed)
Medication faxed to pharmacy 

## 2016-05-11 ENCOUNTER — Other Ambulatory Visit: Payer: Self-pay | Admitting: Internal Medicine

## 2016-05-11 NOTE — Telephone Encounter (Signed)
Please advise 

## 2016-05-11 NOTE — Telephone Encounter (Signed)
Tramadol too soon as last rx was just done may 17

## 2016-05-11 NOTE — Telephone Encounter (Signed)
Pt left msg on triage stating pharmacy states his refill was denied. Requesting call a bck. Called pharmacy to verify if they received fax script from 5/18. Per Bill/pharmacist they did not receive. Gave order verbally for tramadol. Called pt inform him the status on refill...[name] Gross

## 2016-05-13 ENCOUNTER — Other Ambulatory Visit: Payer: Self-pay | Admitting: Cardiovascular Disease

## 2016-08-12 ENCOUNTER — Ambulatory Visit: Payer: BC Managed Care – PPO

## 2016-08-12 ENCOUNTER — Ambulatory Visit (INDEPENDENT_AMBULATORY_CARE_PROVIDER_SITE_OTHER): Payer: BC Managed Care – PPO | Admitting: Internal Medicine

## 2016-08-12 VITALS — BP 130/68 | HR 90 | Temp 98.4°F | Resp 20 | Wt 183.0 lb

## 2016-08-12 DIAGNOSIS — R030 Elevated blood-pressure reading, without diagnosis of hypertension: Secondary | ICD-10-CM

## 2016-08-12 DIAGNOSIS — H6691 Otitis media, unspecified, right ear: Secondary | ICD-10-CM

## 2016-08-12 DIAGNOSIS — J309 Allergic rhinitis, unspecified: Secondary | ICD-10-CM | POA: Diagnosis not present

## 2016-08-12 DIAGNOSIS — J45909 Unspecified asthma, uncomplicated: Secondary | ICD-10-CM | POA: Diagnosis not present

## 2016-08-12 MED ORDER — LEVOFLOXACIN 500 MG PO TABS: 500.0000 mg | ORAL_TABLET | Freq: Every day | ORAL | 0 refills | Status: AC

## 2016-08-12 MED ORDER — TRIAMCINOLONE ACETONIDE 55 MCG/ACT NA AERO: 2.0000 | INHALATION_SPRAY | Freq: Every day | NASAL | 12 refills | Status: DC

## 2016-08-12 MED ORDER — METHYLPREDNISOLONE ACETATE 80 MG/ML IJ SUSP
80.0000 mg | Freq: Once | INTRAMUSCULAR | Status: AC
  Administered 2016-08-12: 80 mg via INTRAMUSCULAR

## 2016-08-12 MED ORDER — CETIRIZINE HCL 10 MG PO TABS: 10.0000 mg | ORAL_TABLET | Freq: Every day | ORAL | 11 refills | Status: DC

## 2016-08-12 NOTE — Progress Notes (Signed)
Subjective:    Patient ID: [name] Gross, male    DOB: [birth year], 55 y.o.   MRN: [id number]  HPI   Here with 2-3 days acute onset fever, right ear pain, pressure, headache, general weakness and malaise, with mild ST and cough, but pt denies chest pain, wheezing, increased sob or doe, orthopnea, PND, increased LE swelling, palpitations, dizziness or syncope.  Has some popping and crackling to right ear, sometimes hearing reduced, most of the time ok.  Does have several wks ongoing nasal allergy symptoms with clearish congestion, itch and sneezing. Past Medical History:  Diagnosis Date  . Allergic rhinitis   . Anxiety   . Asthma    mild intermittent  . CAD (coronary artery disease)    s/p INF STEMI 12/04/2010: treated with BMS to RCA c/b acute stent thrombosis at completion of  primary PCI ...above tx with second BMS with adjunctive thrombectomy residual CAD at cath 12/04/2010: pRCA 40-50%; LAD and CFX ok; EF 45% Echo 12/06/2010: Ef 55-65%; ?HK inferior wall; mild MR  . Erectile dysfunction   . GERD (gastroesophageal reflux disease)   . Glucose intolerance (impaired glucose tolerance)   . HTN (hypertension)   . Obesity   . OSA (obstructive sleep apnea)    mild, no CPAP per pt preference  . Osteoarthritis   . Prostate cancer [facility])    Past Surgical History:  Procedure Laterality Date  . CORONARY STENT PLACEMENT     Stenting of the right coronary artery......... Successful percutaneous coronary intervention with  adjunctive thrombectomy for treatment of acute stent thrombosis and   reocclusion of the right coronary artery after treatment earlier today  of an acute myocardial infarction  . ELBOW SURGERY    . KNEE SURGERY    . NASAL SINUS SURGERY    . SPERMATOCELECTOMY    . WRIST SURGERY      reports that he has quit smoking. His smoking use included Cigarettes. He has never used smokeless tobacco. He reports that he does not drink alcohol or use drugs. family history includes Breast  cancer in his maternal aunt; Coronary artery disease in his father; Diabetes in his father; Esophageal cancer in his mother; Heart disease in his father, maternal aunt, maternal grandfather, maternal grandmother, maternal uncle, and mother; Hip fracture in his father; Liver cancer in his maternal uncle. Allergies  Allergen Reactions  . Amoxicillin     Per the pt "it was years ago and does not remember the reaction"   Current Outpatient Prescriptions on File Prior to Visit  Medication Sig Dispense Refill  . aspirin 81 MG EC tablet TAKE 1 TABLET BY MOUTH EVERY DAY 90 tablet 0  . clobetasol cream (TEMOVATE) AB-123456789 % APPLY 1 APPLICATION TOPICALLY 2 (TWO) TIMES DAILY. 30 g 0  . CRESTOR 5 MG tablet TAKE 1 TABLET BY MOUTH EVERY DAY 30 tablet 0  . escitalopram (LEXAPRO) 10 MG tablet Take 1 tablet (10 mg total) by mouth daily. 90 tablet 3  . nitroGLYCERIN (NITROSTAT) 0.4 MG SL tablet Place 1 tablet (0.4 mg total) under the tongue every 5 (five) minutes as needed. 25 tablet 3  . rosuvastatin (CRESTOR) 5 MG tablet Take 1 tablet (5 mg total) by mouth daily. 90 tablet 3  . traMADol (ULTRAM) 50 MG tablet TAKE 1 TABLET BY MOUTH TWICE A DAY AS NEEDED 60 tablet 5   No current facility-administered medications on file prior to visit.    Review of Systems  Constitutional: Negative for unusual diaphoresis or  night sweats HENT: Negative for ear swelling or discharge Eyes: Negative for worsening visual haziness  Respiratory: Negative for choking and stridor.   Gastrointestinal: Negative for distension or worsening eructation Genitourinary: Negative for retention or change in urine volume.  Musculoskeletal: Negative for other MSK pain or swelling Skin: Negative for color change and worsening wound Neurological: Negative for tremors and numbness other than noted  Psychiatric/Behavioral: Negative for decreased concentration or agitation other than above       Objective:   Physical Exam BP 130/68   Pulse 90    Temp 98.4 F (36.9 C) (Oral)   Resp 20   Wt 183 lb (83 kg)   SpO2 97%   BMI 31.41 kg/m  VS noted, mild ill Constitutional: Pt appears in no apparent distress HENT: Head: NCAT.  Right Ear: External ear normal.  Left Ear: External ear normal.  Right tm's with severe erythema with bulging, left TM mild only.  Max sinus areas non tender.  Pharynx with mild erythema, no exudate Eyes: . Pupils are equal, round, and reactive to light. Conjunctivae and EOM are normal Neck: Normal range of motion. Neck supple.  Cardiovascular: Normal rate and regular rhythm.   Pulmonary/Chest: Effort normal and breath sounds without rales or wheezing.  Neurological: Pt is alert. Not confused , motor grossly intact Skin: Skin is warm. No rash, no LE edema Psychiatric: Pt behavior is normal. No agitation.     Assessment & Plan:

## 2016-08-12 NOTE — Assessment & Plan Note (Signed)
stable overall by history and exam, recent data reviewed with pt, and pt to continue medical treatment as before,  to f/u any worsening symptoms or concerns BP Readings from Last 3 Encounters:  08/12/16 130/68  10/07/15 118/70  12/31/14 134/82

## 2016-08-12 NOTE — Assessment & Plan Note (Signed)
Mild to mod, for antibx course,  to f/u any worsening symptoms or concerns 

## 2016-08-12 NOTE — Assessment & Plan Note (Signed)
stable overall by history and exam, recent data reviewed with pt, and pt to continue medical treatment as before,  to f/u any worsening symptoms or concerns @LASTSAO2(3)@  

## 2016-08-12 NOTE — Progress Notes (Signed)
Pre visit review using our clinic review tool, if applicable. No additional management support is needed unless otherwise documented below in the visit note. 

## 2016-08-12 NOTE — Assessment & Plan Note (Signed)
For restart zyrtec/nasacort asd,  to f/u any worsening symptoms or concerns

## 2016-08-12 NOTE — Patient Instructions (Signed)
You had the steroid shot today  Please take all new medication as prescribed - the antibiotic, zyrtec and nasacort  You can also take Mucinex (or it's generic off brand) for congestion, and tylenol as needed for pain.  Please continue all other medications as before, and refills have been done if requested.  Please have the pharmacy call with any other refills you may need.  Please keep your appointments with your specialists as you may have planned

## 2016-08-17 ENCOUNTER — Other Ambulatory Visit: Payer: Self-pay | Admitting: Cardiovascular Disease

## 2016-08-22 ENCOUNTER — Encounter: Payer: Self-pay | Admitting: Internal Medicine

## 2016-08-24 ENCOUNTER — Other Ambulatory Visit: Payer: Self-pay | Admitting: Cardiovascular Disease

## 2016-08-25 MED ORDER — ASPIRIN 81 MG PO TBEC: 81.0000 mg | DELAYED_RELEASE_TABLET | Freq: Every day | ORAL | 0 refills | Status: DC

## 2016-08-25 NOTE — Telephone Encounter (Signed)
Pt called to make appt with [name] Gross, pt needs refill of Asa 81mg 

## 2016-09-29 ENCOUNTER — Encounter: Payer: Self-pay | Admitting: Cardiovascular Disease

## 2016-09-30 ENCOUNTER — Other Ambulatory Visit: Payer: Self-pay | Admitting: Internal Medicine

## 2016-10-14 ENCOUNTER — Ambulatory Visit (INDEPENDENT_AMBULATORY_CARE_PROVIDER_SITE_OTHER): Payer: BC Managed Care – PPO | Admitting: Internal Medicine

## 2016-10-14 ENCOUNTER — Encounter: Payer: Self-pay | Admitting: Internal Medicine

## 2016-10-14 VITALS — BP 118/88 | HR 105 | Temp 98.3°F | Ht 64.0 in | Wt 176.0 lb

## 2016-10-14 DIAGNOSIS — Z23 Encounter for immunization: Secondary | ICD-10-CM | POA: Diagnosis not present

## 2016-10-14 DIAGNOSIS — Z0000 Encounter for general adult medical examination without abnormal findings: Secondary | ICD-10-CM

## 2016-10-14 NOTE — Progress Notes (Signed)
Pre visit review using our clinic review tool, if applicable. No additional management support is needed unless otherwise documented below in the visit note. 

## 2016-10-14 NOTE — Patient Instructions (Addendum)

## 2016-10-14 NOTE — Progress Notes (Signed)
Subjective:    Patient ID: [name] Gross, male    DOB: [birth year], 55 y.o.   MRN: [id number]  HPI  Here for wellness and f/u;  Overall doing ok;  Pt denies Chest pain, worsening SOB, DOE, wheezing, orthopnea, PND, worsening LE edema, palpitations, dizziness or syncope.  Pt denies neurological change such as new headache, facial or extremity weakness.  Pt denies polydipsia, polyuria, or low sugar symptoms. Pt states overall good compliance with treatment and medications, good tolerability, and has been trying to follow appropriate diet.  Pt denies worsening depressive symptoms, suicidal ideation or panic. No fever, night sweats, wt loss, loss of appetite, or other constitutional symptoms.  Pt states good ability with ADL's, has low fall risk, home safety reviewed and adequate, no other significant changes in hearing or vision, and only occasionally active with exercise.  Declines tetanus for now.  Due for f/u Dr Burt Knack dec 23.  Denies other change to hx Past Medical History:  Diagnosis Date  . Allergic rhinitis   . Anxiety   . Asthma    mild intermittent  . CAD (coronary artery disease)    s/p INF STEMI 12/04/2010: treated with BMS to RCA c/b acute stent thrombosis at completion of  primary PCI ...above tx with second BMS with adjunctive thrombectomy residual CAD at cath 12/04/2010: pRCA 40-50%; LAD and CFX ok; EF 45% Echo 12/06/2010: Ef 55-65%; ?HK inferior wall; mild MR  . Erectile dysfunction   . GERD (gastroesophageal reflux disease)   . Glucose intolerance (impaired glucose tolerance)   . HTN (hypertension)   . Obesity   . OSA (obstructive sleep apnea)    mild, no CPAP per pt preference  . Osteoarthritis   . Prostate cancer [facility])    Past Surgical History:  Procedure Laterality Date  . CORONARY STENT PLACEMENT     Stenting of the right coronary artery......... Successful percutaneous coronary intervention with  adjunctive thrombectomy for treatment of acute stent thrombosis and    reocclusion of the right coronary artery after treatment earlier today  of an acute myocardial infarction  . ELBOW SURGERY    . KNEE SURGERY    . NASAL SINUS SURGERY    . SPERMATOCELECTOMY    . WRIST SURGERY      reports that he has quit smoking. His smoking use included Cigarettes. He has never used smokeless tobacco. He reports that he does not drink alcohol or use drugs. family history includes Breast cancer in his maternal aunt; Coronary artery disease in his father; Diabetes in his father; Esophageal cancer in his mother; Heart disease in his father, maternal aunt, maternal grandfather, maternal grandmother, maternal uncle, and mother; Hip fracture in his father; Liver cancer in his maternal uncle. Allergies  Allergen Reactions  . Amoxicillin     Per the pt "it was years ago and does not remember the reaction"   Current Outpatient Prescriptions on File Prior to Visit  Medication Sig Dispense Refill  . aspirin 81 MG EC tablet Take 1 tablet (81 mg total) by mouth daily. Swallow whole. 100 tablet 0  . escitalopram (LEXAPRO) 10 MG tablet Take 1 tablet (10 mg total) by mouth daily. 90 tablet 3  . nitroGLYCERIN (NITROSTAT) 0.4 MG SL tablet Place 1 tablet (0.4 mg total) under the tongue every 5 (five) minutes as needed. 25 tablet 3  . rosuvastatin (CRESTOR) 5 MG tablet TAKE 1 TABLET EVERY DAY 90 tablet 3  . traMADol (ULTRAM) 50 MG tablet TAKE 1 TABLET BY  MOUTH TWICE A DAY AS NEEDED 60 tablet 5  . cetirizine (ZYRTEC) 10 MG tablet Take 1 tablet (10 mg total) by mouth daily. (Patient not taking: Reported on 10/14/2016) 30 tablet 11  . clobetasol cream (TEMOVATE) AB-123456789 % APPLY 1 APPLICATION TOPICALLY 2 (TWO) TIMES DAILY. (Patient not taking: Reported on 10/14/2016) 30 g 0  . CRESTOR 5 MG tablet TAKE 1 TABLET BY MOUTH EVERY DAY (Patient not taking: Reported on 10/14/2016) 30 tablet 0  . triamcinolone (NASACORT AQ) 55 MCG/ACT AERO nasal inhaler Place 2 sprays into the nose daily. (Patient not taking:  Reported on 10/14/2016) 1 Inhaler 12   No current facility-administered medications on file prior to visit.     Review of Systems Constitutional: Negative for increased diaphoresis, or other activity, appetite or siginficant weight change other than noted HENT: Negative for worsening hearing loss, ear pain, facial swelling, mouth sores and neck stiffness.   Eyes: Negative for other worsening pain, redness or visual disturbance.  Respiratory: Negative for choking or stridor Cardiovascular: Negative for other chest pain and palpitations.  Gastrointestinal: Negative for worsening diarrhea, blood in stool, or abdominal distention Genitourinary: Negative for hematuria, flank pain or change in urine volume.  Musculoskeletal: Negative for myalgias or other joint complaints.  Skin: Negative for other color change and wound or drainage.  Neurological: Negative for syncope and numbness. other than noted Hematological: Negative for adenopathy. or other swelling Psychiatric/Behavioral: Negative for hallucinations, SI, self-injury, decreased concentration or other worsening agitation.  All other systems non contributory    Objective:   Physical Exam BP 118/88   Pulse (!) 105   Temp 98.3 F (36.8 C) (Oral)   Ht 5\' 4"  (1.626 m)   Wt 176 lb (79.8 kg)   SpO2 96%   BMI 30.21 kg/m  VS noted,  Constitutional: Pt is oriented to person, place, and time. Appears well-developed and well-nourished, in no significant distress Head: Normocephalic and atraumatic  Eyes: Conjunctivae and EOM are normal. Pupils are equal, round, and reactive to light Right Ear: External ear normal.  Left Ear: External ear normal Nose: Nose normal.  Mouth/Throat: Oropharynx is clear and moist  Neck: Normal range of motion. Neck supple. No JVD present. No tracheal deviation present or significant neck LA or mass Cardiovascular: Normal rate, regular rhythm, normal heart sounds and intact distal pulses.   Pulmonary/Chest:  Effort normal and breath sounds without rales or wheezing  Abdominal: Soft. Bowel sounds are normal. NT. No HSM  Musculoskeletal: Normal range of motion. Exhibits no edema Lymphadenopathy: Has no cervical adenopathy.  Neurological: Pt is alert and oriented to person, place, and time. Pt has normal reflexes. No cranial nerve deficit. Motor grossly intact Skin: Skin is warm and dry. No rash noted or new ulcers Psychiatric:  Has normal mood and affect. Behavior is normal.  No other new significant exam abnormal  <ECG>today I have personally interpreted Sinus  Rhythm  -RSR(V1) -nondiagnostic.   -Old inferior infarct.     Assessment & Plan:

## 2016-10-15 ENCOUNTER — Other Ambulatory Visit (INDEPENDENT_AMBULATORY_CARE_PROVIDER_SITE_OTHER): Payer: BC Managed Care – PPO

## 2016-10-15 DIAGNOSIS — R7989 Other specified abnormal findings of blood chemistry: Secondary | ICD-10-CM | POA: Diagnosis not present

## 2016-10-15 DIAGNOSIS — Z0000 Encounter for general adult medical examination without abnormal findings: Secondary | ICD-10-CM

## 2016-10-15 LAB — BASIC METABOLIC PANEL
BUN: 16 mg/dL (ref 6–23)
CALCIUM: 10.2 mg/dL (ref 8.4–10.5)
CO2: 32 mEq/L (ref 19–32)
CREATININE: 1.27 mg/dL (ref 0.40–1.50)
Chloride: 103 mEq/L (ref 96–112)
GFR: 62.44 mL/min (ref >60.00)
GLUCOSE: 108 mg/dL — AB (ref 70–99)
POTASSIUM: 5.1 meq/L (ref 3.5–5.1)
Sodium: 143 mEq/L (ref 135–145)

## 2016-10-15 LAB — CBC WITH DIFFERENTIAL/PLATELET
BASOS ABS: 0.1 10*3/uL (ref 0.0–0.1)
Basophils Relative: 1.1 % (ref 0.0–3.0)
EOS ABS: 0.5 10*3/uL (ref 0.0–0.7)
EOS PCT: 6.8 % — AB (ref 0.0–5.0)
HEMATOCRIT: 48.2 % (ref 39.0–52.0)
Hemoglobin: 16.3 g/dL (ref 13.0–17.0)
Lymphocytes Relative: 22.2 % (ref 12.0–46.0)
Lymphs Abs: 1.8 10*3/uL (ref 0.7–4.0)
MCHC: 33.9 g/dL (ref 30.0–36.0)
MCV: 86.8 fl (ref 78.0–100.0)
Monocytes Absolute: 0.6 10*3/uL (ref 0.1–1.0)
Monocytes Relative: 8.1 % (ref 3.0–12.0)
NEUTROS PCT: 61.8 % (ref 43.0–77.0)
Neutro Abs: 4.9 10*3/uL (ref 1.4–7.7)
PLATELETS: 293 10*3/uL (ref 150.0–400.0)
RBC: 5.55 Mil/uL (ref 4.22–5.81)
RDW: 13.2 % (ref 11.5–15.5)
WBC: 7.9 10*3/uL (ref 4.0–10.5)

## 2016-10-15 LAB — URINALYSIS, ROUTINE W REFLEX MICROSCOPIC
Bilirubin Urine: NEGATIVE
HGB URINE DIPSTICK: NEGATIVE
Ketones, ur: NEGATIVE
Leukocytes, UA: NEGATIVE
Nitrite: NEGATIVE
RBC / HPF: NONE SEEN (ref 0–?)
SPECIFIC GRAVITY, URINE: 1.015 (ref 1.000–1.030)
Total Protein, Urine: NEGATIVE
UROBILINOGEN UA: 0.2 (ref 0.0–1.0)
Urine Glucose: NEGATIVE
WBC UA: NONE SEEN (ref 0–?)
pH: 6.5 (ref 5.0–8.0)

## 2016-10-15 LAB — LIPID PANEL
CHOLESTEROL: 150 mg/dL (ref 0–200)
HDL: 47.6 mg/dL (ref >39.00)
NonHDL: 101.91
TRIGLYCERIDES: 214 mg/dL — AB (ref 0.0–149.0)
Total CHOL/HDL Ratio: 3
VLDL: 42.8 mg/dL — AB (ref 0.0–40.0)

## 2016-10-15 LAB — HEPATIC FUNCTION PANEL
ALBUMIN: 4.7 g/dL (ref 3.5–5.2)
ALT: 46 U/L (ref 0–53)
AST: 30 U/L (ref 0–37)
Alkaline Phosphatase: 81 U/L (ref 39–117)
Bilirubin, Direct: 0.1 mg/dL (ref 0.0–0.3)
Total Bilirubin: 0.6 mg/dL (ref 0.2–1.2)
Total Protein: 6.8 g/dL (ref 6.0–8.3)

## 2016-10-15 LAB — LDL CHOLESTEROL, DIRECT: LDL DIRECT: 63 mg/dL

## 2016-10-16 NOTE — Assessment & Plan Note (Signed)

## 2016-10-18 LAB — TSH: TSH: 2.24 u[IU]/mL (ref 0.35–4.50)

## 2016-10-18 LAB — PSA: PSA: 0.02 ng/mL — AB (ref 0.10–4.00)

## 2016-10-20 ENCOUNTER — Other Ambulatory Visit: Payer: Self-pay | Admitting: Internal Medicine

## 2016-11-25 ENCOUNTER — Other Ambulatory Visit: Payer: Self-pay | Admitting: Internal Medicine

## 2016-11-28 ENCOUNTER — Other Ambulatory Visit: Payer: Self-pay | Admitting: Cardiovascular Disease

## 2016-12-06 ENCOUNTER — Encounter: Payer: Self-pay | Admitting: Cardiovascular Disease

## 2016-12-06 ENCOUNTER — Ambulatory Visit (INDEPENDENT_AMBULATORY_CARE_PROVIDER_SITE_OTHER): Payer: BC Managed Care – PPO | Admitting: Cardiovascular Disease

## 2016-12-06 VITALS — BP 126/82 | HR 94 | Ht 64.5 in | Wt 179.4 lb

## 2016-12-06 DIAGNOSIS — I252 Old myocardial infarction: Secondary | ICD-10-CM

## 2016-12-06 MED ORDER — ROSUVASTATIN CALCIUM 5 MG PO TABS: 5.0000 mg | ORAL_TABLET | Freq: Every day | ORAL | 3 refills | Status: DC

## 2016-12-06 MED ORDER — NITROGLYCERIN 0.4 MG SL SUBL: 0.4000 mg | SUBLINGUAL_TABLET | SUBLINGUAL | 2 refills | Status: DC | PRN

## 2016-12-06 NOTE — Patient Instructions (Signed)

## 2016-12-06 NOTE — Progress Notes (Signed)
Cardiology Office Note Date:  12/06/2016   ID:  [name], DOB 06-05-1961, MRN [id number]  PCP:  Cathlean Cower, MD  Cardiologist:  Sherren Mocha, MD    Chief Complaint  Patient presents with  . coronary atherosclerosis     History of Present Illness: [name] Gross is a 55 y.o. male who presents for followup of coronary artery disease. He initially presented in 2011 with an inferior wall ST elevation MI. He was treated with a bare-metal stenting of the right coronary artery. He had acute stent thrombosis and a second stent had to be placed. He's done well since that time. His left ventricular function is preserved with an ejection fraction of 55-65%.  He's not exercising regularly but stays very active. He's working 5-6 days every week. He is 'on the go from sun-up to sun-down' every day. Today, he denies symptoms of palpitations, chest pain, shortness of breath, orthopnea, PND, lower extremity edema, dizziness, or syncope.  Past Medical History:  Diagnosis Date  . Allergic rhinitis   . Anxiety   . Asthma    mild intermittent  . CAD (coronary artery disease)    s/p INF STEMI 12/04/2010: treated with BMS to RCA c/b acute stent thrombosis at completion of  primary PCI ...above tx with second BMS with adjunctive thrombectomy residual CAD at cath 12/04/2010: pRCA 40-50%; LAD and CFX ok; EF 45% Echo 12/06/2010: Ef 55-65%; ?HK inferior wall; mild MR  . Erectile dysfunction   . GERD (gastroesophageal reflux disease)   . Glucose intolerance (impaired glucose tolerance)   . HTN (hypertension)   . Obesity   . OSA (obstructive sleep apnea)    mild, no CPAP per pt preference  . Osteoarthritis   . Prostate cancer [facility])     Past Surgical History:  Procedure Laterality Date  . CORONARY STENT PLACEMENT     Stenting of the right coronary artery......... Successful percutaneous coronary intervention with  adjunctive thrombectomy for treatment of acute stent thrombosis and   reocclusion of  the right coronary artery after treatment earlier today  of an acute myocardial infarction  . ELBOW SURGERY    . KNEE SURGERY    . NASAL SINUS SURGERY    . SPERMATOCELECTOMY    . WRIST SURGERY      Current Outpatient Prescriptions  Medication Sig Dispense Refill  . ALLEGRA-D ALLERGY & CONGESTION 60-120 MG 12 hr tablet Take 1 tablet by mouth 2 (two) times daily.  13  . aspirin 81 MG EC tablet Take 1 tablet (81 mg total) by mouth daily. Swallow whole. 100 tablet 0  . clobetasol cream (TEMOVATE) AB-123456789 % APPLY 1 APPLICATION TOPICALLY 2 (TWO) TIMES DAILY. 30 g 0  . escitalopram (LEXAPRO) 10 MG tablet TAKE 1 TABLET EVERY DAY 90 tablet 3  . nitroGLYCERIN (NITROSTAT) 0.4 MG SL tablet Place 1 tablet (0.4 mg total) under the tongue every 5 (five) minutes as needed for chest pain. 25 tablet 2  . rosuvastatin (CRESTOR) 5 MG tablet Take 1 tablet (5 mg total) by mouth daily. 90 tablet 3  . traMADol (ULTRAM) 50 MG tablet TAKE 1 TABLET BY MOUTH TWICE A DAY AS NEEDED 60 tablet 5   No current facility-administered medications for this visit.     Allergies:   Amoxicillin   Social History:  The patient  reports that he has quit smoking. His smoking use included Cigarettes. He has never used smokeless tobacco. He reports that he does not drink alcohol or use drugs.  Family History:  The patient's  family history includes Breast cancer in his maternal aunt; Coronary artery disease in his father; Diabetes in his father; Esophageal cancer in his mother; Heart disease in his father, maternal aunt, maternal grandfather, maternal grandmother, maternal uncle, and mother; Hip fracture in his father; Liver cancer in his maternal uncle.    ROS:  Please see the history of present illness.   All other systems are reviewed and negative.    PHYSICAL EXAM: VS:  BP 126/82   Pulse 94   Ht 5' 4.5" (1.638 m)   Wt 179 lb 6.4 oz (81.4 kg)   SpO2 95%   BMI 30.32 kg/m  , BMI Body mass index is 30.32 kg/m. GEN: Well  nourished, well developed, in no acute distress  HEENT: normal  Neck: no JVD, no masses. No carotid bruits Cardiac: RRR without murmur or gallop                Respiratory:  clear to auscultation bilaterally, normal work of breathing GI: soft, nontender, nondistended, + BS MS: no deformity or atrophy  Ext: no pretibial edema, pedal pulses 2+= bilaterally Skin: warm and dry, no rash Neuro:  Strength and sensation are intact Psych: euthymic mood, full affect  EKG:  EKG is not ordered today.  Recent Labs: 10/15/2016: ALT 46; BUN 16; Creatinine, Ser 1.27; Hemoglobin 16.3; Platelets 293.0; Potassium 5.1; Sodium 143; TSH 2.24   Lipid Panel     Component Value Date/Time   CHOL 150 10/15/2016 0841   TRIG 214.0 (H) 10/15/2016 0841   TRIG 241 (HH) 10/31/2006 0917   HDL 47.60 10/15/2016 0841   CHOLHDL 3 10/15/2016 0841   VLDL 42.8 (H) 10/15/2016 0841   LDLCALC 84 10/04/2014 0828   LDLDIRECT 63.0 10/15/2016 0841      Wt Readings from Last 3 Encounters:  12/06/16 179 lb 6.4 oz (81.4 kg)  10/14/16 176 lb (79.8 kg)  08/12/16 183 lb (83 kg)     ASSESSMENT AND PLAN: 1.  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 Patient is on aspirin and a statin drug. The patient has been intolerant to beta-blockers.   2. Hyperlipidemia: continue Crestor 5 mg daily with dose limited by tolerability. Reviewed his elevated triglycerides and discussed lifestyle modification as outlined.  Current medicines are reviewed with the patient today.  The patient does not have concerns regarding medicines.  Labs/ tests ordered today include:  No orders of the defined types were placed in this encounter.   Disposition:   FU one year  Signed, Sherren Mocha, MD  12/06/2016 9:02 AM    [facility] [address], [place], [place]  [address] Phone: [phone]; Fax: [phone]

## 2016-12-14 ENCOUNTER — Other Ambulatory Visit: Payer: Self-pay | Admitting: Cardiovascular Disease

## 2016-12-22 ENCOUNTER — Encounter: Payer: Self-pay | Admitting: Internal Medicine

## 2016-12-22 MED ORDER — TRAMADOL HCL 50 MG PO TABS: 50.0000 mg | ORAL_TABLET | Freq: Two times a day (BID) | ORAL | 5 refills | Status: DC | PRN

## 2016-12-22 NOTE — Telephone Encounter (Signed)
Done hardcopy to Corinne  

## 2016-12-22 NOTE — Telephone Encounter (Signed)
Script faxed to [facility].../lmb 

## 2017-03-14 ENCOUNTER — Encounter: Payer: Self-pay | Admitting: Cardiovascular Disease

## 2017-03-17 ENCOUNTER — Encounter: Payer: Self-pay | Admitting: Cardiovascular Disease

## 2017-04-05 ENCOUNTER — Encounter: Payer: Self-pay | Admitting: Internal Medicine

## 2017-04-08 ENCOUNTER — Ambulatory Visit (INDEPENDENT_AMBULATORY_CARE_PROVIDER_SITE_OTHER): Payer: BC Managed Care – PPO

## 2017-04-08 DIAGNOSIS — Z23 Encounter for immunization: Secondary | ICD-10-CM

## 2017-06-09 ENCOUNTER — Encounter: Payer: Self-pay | Admitting: Cardiovascular Disease

## 2017-06-09 NOTE — Telephone Encounter (Signed)
I contacted the pt by phone and he states that he gets a hot feeling in his chest and just as quickly as it comes it goes away.  The pt does not have issues with reflux.  I advised him that this is not cardiac related and he can continue with observation at this time.  The pt denies any symptoms of SOB, CP, arm or jaw pain, or dizziness.

## 2017-07-13 ENCOUNTER — Other Ambulatory Visit: Payer: Self-pay | Admitting: Internal Medicine

## 2017-07-13 NOTE — Telephone Encounter (Signed)
faxed

## 2017-07-13 NOTE — Telephone Encounter (Signed)
Done hardcopy to Shirron  

## 2017-09-30 ENCOUNTER — Encounter: Payer: Self-pay | Admitting: Internal Medicine

## 2017-10-17 ENCOUNTER — Encounter: Payer: Self-pay | Admitting: Internal Medicine

## 2017-10-17 ENCOUNTER — Other Ambulatory Visit: Payer: Self-pay | Admitting: Internal Medicine

## 2017-10-17 ENCOUNTER — Other Ambulatory Visit (INDEPENDENT_AMBULATORY_CARE_PROVIDER_SITE_OTHER): Payer: BC Managed Care – PPO

## 2017-10-17 ENCOUNTER — Ambulatory Visit (INDEPENDENT_AMBULATORY_CARE_PROVIDER_SITE_OTHER): Payer: BC Managed Care – PPO | Admitting: Internal Medicine

## 2017-10-17 VITALS — BP 138/86 | HR 77 | Temp 97.7°F | Ht 64.5 in | Wt 183.0 lb

## 2017-10-17 DIAGNOSIS — E119 Type 2 diabetes mellitus without complications: Secondary | ICD-10-CM

## 2017-10-17 DIAGNOSIS — E781 Pure hyperglyceridemia: Secondary | ICD-10-CM | POA: Insufficient documentation

## 2017-10-17 DIAGNOSIS — M25562 Pain in left knee: Secondary | ICD-10-CM | POA: Insufficient documentation

## 2017-10-17 DIAGNOSIS — R7302 Impaired glucose tolerance (oral): Secondary | ICD-10-CM

## 2017-10-17 DIAGNOSIS — Z23 Encounter for immunization: Secondary | ICD-10-CM | POA: Diagnosis not present

## 2017-10-17 DIAGNOSIS — M25561 Pain in right knee: Secondary | ICD-10-CM

## 2017-10-17 DIAGNOSIS — Z114 Encounter for screening for human immunodeficiency virus [HIV]: Secondary | ICD-10-CM

## 2017-10-17 DIAGNOSIS — Z0000 Encounter for general adult medical examination without abnormal findings: Secondary | ICD-10-CM | POA: Diagnosis not present

## 2017-10-17 LAB — BASIC METABOLIC PANEL
BUN: 14 mg/dL (ref 6–23)
CALCIUM: 9.9 mg/dL (ref 8.4–10.5)
CHLORIDE: 100 meq/L (ref 96–112)
CO2: 32 meq/L (ref 19–32)
CREATININE: 1.12 mg/dL (ref 0.40–1.50)
GFR: 71.93 mL/min (ref >60.00)
Glucose, Bld: 200 mg/dL — ABNORMAL HIGH (ref 70–99)
Potassium: 4.7 mEq/L (ref 3.5–5.1)
Sodium: 139 mEq/L (ref 135–145)

## 2017-10-17 LAB — CBC WITH DIFFERENTIAL/PLATELET
BASOS ABS: 0 10*3/uL (ref 0.0–0.1)
Basophils Relative: 0.3 % (ref 0.0–3.0)
EOS ABS: 0.5 10*3/uL (ref 0.0–0.7)
Eosinophils Relative: 6.5 % — ABNORMAL HIGH (ref 0.0–5.0)
HEMATOCRIT: 46.4 % (ref 39.0–52.0)
HEMOGLOBIN: 15.7 g/dL (ref 13.0–17.0)
LYMPHS PCT: 23.7 % (ref 12.0–46.0)
Lymphs Abs: 1.9 10*3/uL (ref 0.7–4.0)
MCHC: 33.9 g/dL (ref 30.0–36.0)
MCV: 88.3 fl (ref 78.0–100.0)
Monocytes Absolute: 0.6 10*3/uL (ref 0.1–1.0)
Monocytes Relative: 7 % (ref 3.0–12.0)
NEUTROS ABS: 4.9 10*3/uL (ref 1.4–7.7)
Neutrophils Relative %: 62.5 % (ref 43.0–77.0)
PLATELETS: 302 10*3/uL (ref 150.0–400.0)
RBC: 5.25 Mil/uL (ref 4.22–5.81)
RDW: 13.3 % (ref 11.5–15.5)
WBC: 7.9 10*3/uL (ref 4.0–10.5)

## 2017-10-17 LAB — URINALYSIS, ROUTINE W REFLEX MICROSCOPIC
BILIRUBIN URINE: NEGATIVE
Hgb urine dipstick: NEGATIVE
KETONES UR: NEGATIVE
LEUKOCYTES UA: NEGATIVE
Nitrite: NEGATIVE
PH: 6.5 (ref 5.0–8.0)
RBC / HPF: NONE SEEN (ref 0–?)
Specific Gravity, Urine: 1.02 (ref 1.000–1.030)
Total Protein, Urine: NEGATIVE
UROBILINOGEN UA: 0.2 (ref 0.0–1.0)
Urine Glucose: >=1000 — AB
WBC UA: NONE SEEN (ref 0–?)

## 2017-10-17 LAB — HEPATIC FUNCTION PANEL
ALBUMIN: 4.5 g/dL (ref 3.5–5.2)
ALT: 44 U/L (ref 0–53)
AST: 27 U/L (ref 0–37)
Alkaline Phosphatase: 79 U/L (ref 39–117)
Bilirubin, Direct: 0.1 mg/dL (ref 0.0–0.3)
TOTAL PROTEIN: 6.5 g/dL (ref 6.0–8.3)
Total Bilirubin: 0.4 mg/dL (ref 0.2–1.2)

## 2017-10-17 LAB — LIPID PANEL
CHOLESTEROL: 171 mg/dL (ref 0–200)
HDL: 40.4 mg/dL (ref >39.00)
Total CHOL/HDL Ratio: 4
Triglycerides: 696 mg/dL — ABNORMAL HIGH (ref 0.0–149.0)

## 2017-10-17 LAB — TSH: TSH: 2.38 u[IU]/mL (ref 0.35–4.50)

## 2017-10-17 LAB — PSA: PSA: 0.04 ng/mL — AB (ref 0.10–4.00)

## 2017-10-17 LAB — HEMOGLOBIN A1C: HEMOGLOBIN A1C: 7.6 % — AB (ref 4.6–6.5)

## 2017-10-17 LAB — LDL CHOLESTEROL, DIRECT: Direct LDL: 65 mg/dL

## 2017-10-17 MED ORDER — METFORMIN HCL ER 500 MG PO TB24: 500.0000 mg | ORAL_TABLET | Freq: Every day | ORAL | 3 refills | Status: DC

## 2017-10-17 NOTE — Assessment & Plan Note (Signed)
For a1c today with labs, has been diet controlled in past but now with significant wt gain

## 2017-10-17 NOTE — Assessment & Plan Note (Signed)
[place] for volt gel prn,  to f/u any worsening symptoms or concerns

## 2017-10-17 NOTE — Assessment & Plan Note (Signed)

## 2017-10-17 NOTE — Patient Instructions (Addendum)
You had the flu shot today  Please take all new medication as prescribed - the voltaren gel  Please consider making appt with Sports Medicine in this office if not working well for the knee pain  .Please continue all other medications as before, and refills have been done if requested.  Please have the pharmacy call with any other refills you may need.  Please continue your efforts at being more active, low cholesterol diet, and weight control.  You are otherwise up to date with prevention measures today.  Please keep your appointments with your specialists as you may have planned  Please go to the LAB in the Basement (turn left off the elevator) for the tests to be done today  You will be contacted by phone if any changes need to be made immediately.  Otherwise, you will receive a letter about your results with an explanation, but please check with MyChart first.  Please remember to sign up for MyChart if you have not done so, as this will be important to you in the future with finding out test results, communicating by private email, and scheduling acute appointments online when needed.  Please return in 1 year for your yearly visit, or sooner if needed, with Lab testing done 3-5 days before

## 2017-10-17 NOTE — Progress Notes (Signed)
Subjective:    Patient ID: [name] Gross, male    DOB: 25-Aug-1961, 56 y.o.   MRN: [id number]  HPI  Here for wellness and f/u;  Overall doing ok;  Pt denies Chest pain, worsening SOB, DOE, wheezing, orthopnea, PND, worsening LE edema, palpitations, dizziness or syncope.  Pt denies neurological change such as new headache, facial or extremity weakness.  Pt denies polydipsia, polyuria, or low sugar symptoms. Pt states overall good compliance with treatment and medications, good tolerability, and has been trying to follow appropriate diet.  Pt denies worsening depressive symptoms, suicidal ideation or panic. No fever, night sweats, wt loss, loss of appetite, or other constitutional symptoms.  Pt states good ability with ADL's, has low fall risk, home safety reviewed and adequate, no other significant changes in hearing or vision, and only occasionally active with exercise.  Has gained wt this yr with less good diet and less activiuty, Still playing golf occasionally but no aerobics.  Has f/u with Dr Wrenn/urology next month.   Past Medical History:  Diagnosis Date  . Allergic rhinitis   . Anxiety   . Asthma    mild intermittent  . CAD (coronary artery disease)    s/p INF STEMI 12/04/2010: treated with BMS to RCA c/b acute stent thrombosis at completion of  primary PCI ...above tx with second BMS with adjunctive thrombectomy residual CAD at cath 12/04/2010: pRCA 40-50%; LAD and CFX ok; EF 45% Echo 12/06/2010: Ef 55-65%; ?HK inferior wall; mild MR  . Erectile dysfunction   . GERD (gastroesophageal reflux disease)   . Glucose intolerance (impaired glucose tolerance)   . HTN (hypertension)   . Obesity   . OSA (obstructive sleep apnea)    mild, no CPAP per pt preference  . Osteoarthritis   . Prostate cancer [facility])    Past Surgical History:  Procedure Laterality Date  . CORONARY STENT PLACEMENT     Stenting of the right coronary artery......... Successful percutaneous coronary intervention with   adjunctive thrombectomy for treatment of acute stent thrombosis and   reocclusion of the right coronary artery after treatment earlier today  of an acute myocardial infarction  . ELBOW SURGERY    . KNEE SURGERY    . NASAL SINUS SURGERY    . SPERMATOCELECTOMY    . WRIST SURGERY      reports that he has quit smoking. His smoking use included Cigarettes. He has never used smokeless tobacco. He reports that he does not drink alcohol or use drugs. family history includes Breast cancer in his maternal aunt; Coronary artery disease in his father; Diabetes in his father; Esophageal cancer in his mother; Heart disease in his father, maternal aunt, maternal grandfather, maternal grandmother, maternal uncle, and mother; Hip fracture in his father; Liver cancer in his maternal uncle. Allergies  Allergen Reactions  . Amoxicillin     Per the pt "it was years ago and does not remember the reaction"   Current Outpatient Prescriptions on File Prior to Visit  Medication Sig Dispense Refill  . aspirin 81 MG EC tablet TAKE 1 TABLET (81 MG TOTAL) BY MOUTH DAILY. SWALLOW WHOLE. 100 tablet 3  . clobetasol cream (TEMOVATE) 9.21 % APPLY 1 APPLICATION TOPICALLY 2 (TWO) TIMES DAILY. 30 g 0  . escitalopram (LEXAPRO) 10 MG tablet TAKE 1 TABLET EVERY DAY 90 tablet 3  . nitroGLYCERIN (NITROSTAT) 0.4 MG SL tablet Place 1 tablet (0.4 mg total) under the tongue every 5 (five) minutes as needed for chest pain. 25  tablet 2  . rosuvastatin (CRESTOR) 5 MG tablet Take 1 tablet (5 mg total) by mouth daily. 90 tablet 3  . traMADol (ULTRAM) 50 MG tablet TAKE 1 TABLET BY MOUTH TWICE A DAY AS NEEDED 60 tablet 2   No current facility-administered medications on file prior to visit.     Review of Systems Constitutional: Negative for other unusual diaphoresis, sweats, appetite or weight changes HENT: Negative for other worsening hearing loss, ear pain, facial swelling, mouth sores or neck stiffness.   Eyes: Negative for other  worsening pain, redness or other visual disturbance.  Respiratory: Negative for other stridor or swelling Cardiovascular: Negative for other palpitations or other chest pain  Gastrointestinal: Negative for worsening diarrhea or loose stools, blood in stool, distention or other pain Genitourinary: Negative for hematuria, flank pain or other change in urine volume.  Musculoskeletal: Negative for myalgias or other joint swelling.  Skin: Negative for other color change, or other wound or worsening drainage.  Neurological: Negative for other syncope or numbness. Hematological: Negative for other adenopathy or swelling Psychiatric/Behavioral: Negative for hallucinations, other worsening agitation, SI, self-injury, or new decreased concentration All other system neg per pt    Objective:   Physical Exam  BP 138/86   Pulse 77   Temp 97.7 F (36.5 C) (Oral)   Ht 5' 4.5" (1.638 m)   Wt 183 lb (83 kg)   SpO2 98%   BMI 30.93 kg/m  VS noted, obese Constitutional: Pt is oriented to person, place, and time. Appears well-developed and well-nourished, in no significant distress and comfortable Head: Normocephalic and atraumatic  Eyes: Conjunctivae and EOM are normal. Pupils are equal, round, and reactive to light Right Ear: External ear normal without discharge Left Ear: External ear normal without discharge Nose: Nose without discharge or deformity Mouth/Throat: Oropharynx is without other ulcerations and moist  Neck: Normal range of motion. Neck supple. No JVD present. No tracheal deviation present or significant neck LA or mass Cardiovascular: Normal rate, regular rhythm, normal heart sounds and intact distal pulses.   Pulmonary/Chest: WOB normal and breath sounds without rales or wheezing  Abdominal: Soft. Bowel sounds are normal. NT. No HSM  Musculoskeletal: Normal range of motion. Exhibits no edema, has bilat knee crepitus, mild decreased ROM but no effusions Lymphadenopathy: Has no other  cervical adenopathy.  Neurological: Pt is alert and oriented to person, place, and time. Pt has normal reflexes. No cranial nerve deficit. Motor grossly intact, Gait intact Skin: Skin is warm and dry. No rash noted or new ulcerations Psychiatric:  Has normal mood and affect. Behavior is normal without agitation No other exam findings    Assessment & Plan:

## 2017-10-18 LAB — HIV ANTIBODY (ROUTINE TESTING W REFLEX): HIV: NONREACTIVE

## 2017-10-19 ENCOUNTER — Other Ambulatory Visit: Payer: Self-pay | Admitting: Internal Medicine

## 2017-10-19 MED ORDER — DICLOFENAC SODIUM 1 % TD GEL: 4.0000 g | Freq: Four times a day (QID) | TRANSDERMAL | 11 refills | Status: DC | PRN

## 2017-10-20 ENCOUNTER — Telehealth: Payer: Self-pay

## 2017-10-20 NOTE — Telephone Encounter (Signed)
Approved  10/20/2017-10/21/2019  Determination sent to scan

## 2017-10-28 ENCOUNTER — Encounter: Payer: Self-pay | Admitting: Internal Medicine

## 2017-10-29 ENCOUNTER — Other Ambulatory Visit: Payer: Self-pay | Admitting: Cardiovascular Disease

## 2017-11-08 ENCOUNTER — Encounter: Payer: Self-pay | Admitting: Registered"

## 2017-11-08 ENCOUNTER — Encounter: Payer: BC Managed Care – PPO | Attending: Internal Medicine | Admitting: Registered"

## 2017-11-08 DIAGNOSIS — E119 Type 2 diabetes mellitus without complications: Secondary | ICD-10-CM | POA: Diagnosis not present

## 2017-11-08 DIAGNOSIS — Z713 Dietary counseling and surveillance: Secondary | ICD-10-CM | POA: Diagnosis not present

## 2017-11-08 NOTE — Patient Instructions (Addendum)
Consider talking to doctor about your tiredness   Plan:  Aim for 3-4 Carb Choices per meal (45-60 grams) +/- 1 either way  Aim for 0-2 Carbs per snack if hungry  Include protein with your meals and snacks Consider reading food labels for Total Carbohydrate  Consider increasing your activity level as tolerated Consider checking BG at alternate times per day as directed by MD  Continue taking medication as directed by MD

## 2017-11-08 NOTE — Progress Notes (Signed)
Diabetes Self-Management Education  Visit Type: First/Initial  Appt. Start Time: 0930 Appt. End Time: 1050  11/08/2017  [name] Gross, identified by name and date of birth, is a 56 y.o. male with a diagnosis of Diabetes: Type 2.   ASSESSMENT Pt states he has changed his diet since getting the diagnosis. Patient has heard nothing white and states he misses potatoes.   Patient states he is very active, but does not get his heart rate up often. Patient states he goes golfing 1x week, and he used to run and would like to do a 1/2 marathon and states his cardiologist has cleared him for running s/p heart attack.  At next visit RD plans to review balanced eating and then cover the topic of cholesterol and triglycerides (Pt states he has very high TG), Label reading, stress role in BG control including sleep and pain.  Diabetes Self-Management Education - 11/08/17 0939      Visit Information   Visit Type  First/Initial      Initial Visit   Diabetes Type  Type 2    Are you currently following a meal plan?  No    Are you taking your medications as prescribed?  Yes    Date Diagnosed  3 weeks ago      Health Coping   How would you rate your overall health?  Good      Psychosocial Assessment   Patient Belief/Attitude about Diabetes  Motivated to manage diabetes    How often do you need to have someone help you when you read instructions, pamphlets, or other written materials from your doctor or pharmacy?  1 - Never    What is the last grade level you completed in school?  12      Complications   Last HgB A1C per patient/outside source  7.6 % 10/17/17 per chart    How often do you check your blood sugar?  1-2 times/day    Fasting Blood glucose range (mg/dL)  -- 108-154    Number of hypoglycemic episodes per month  0    Number of hyperglycemic episodes per week  0    Have you had a dilated eye exam in the past 12 months?  Yes    Have you had a dental exam in the past 12 months?  Yes    Are you checking your feet?  No      Dietary Intake   Breakfast  MWF egg mcmuffin & diet drink; TTH 2 scrambled eggs, bacon, diet drink    Snack (morning)  banana or apple    Lunch  grilled chicken, vegetables, OR salad, grilled chicken    Snack (afternoon)  fruit OR granola bar    Dinner  steak, sweet potato, salad OR meat, slaw, peach cobler sm portion    Snack (evening)  none    Beverage(s)  diet drink, water, sweettea occassionally, gatorade G2      Exercise   Exercise Type  ADL's    How many days per week to you exercise?  0    How many minutes per day do you exercise?  0    Total minutes per week of exercise  0      Patient Education   Previous Diabetes Education  No    Disease state   Definition of diabetes, type 1 and 2, and the diagnosis of diabetes;Factors that contribute to the development of diabetes    Nutrition management   Role of diet in  the treatment of diabetes and the relationship between the three main macronutrients and blood glucose level;Carbohydrate counting;Food label reading, portion sizes and measuring food.    Physical activity and exercise   Role of exercise on diabetes management, blood pressure control and cardiac health.    Medications  Reviewed patients medication for diabetes, action, purpose, timing of dose and side effects.    Monitoring  Identified appropriate SMBG and/or A1C goals.      Individualized Goals (developed by patient)   Nutrition  General guidelines for healthy choices and portions discussed    Physical Activity  Exercise 3-5 times per week      Outcomes   Expected Outcomes  Demonstrated interest in learning. Expect positive outcomes    Future DMSE  4-6 wks    Program Status  Completed     Individualized Plan for Diabetes Self-Management Training:   Learning Objective:  Patient will have a greater understanding of diabetes self-management. Patient education plan is to attend individual and/or group sessions per assessed needs and  concerns.   Patient Instructions  Consider talking to doctor about your tiredness   Plan:  Aim for 3-4 Carb Choices per meal (45-60 grams) +/- 1 either way  Aim for 0-2 Carbs per snack if hungry  Include protein with your meals and snacks Consider reading food labels for Total Carbohydrate  Consider increasing your activity level as tolerated Consider checking BG at alternate times per day as directed by MD  Continue taking medication as directed by MD  Expected Outcomes:  Demonstrated interest in learning. Expect positive outcomes  Education material provided: Living Well with Diabetes, A1C conversion sheet, My Plate, Snack sheet and Carbohydrate counting sheet  If problems or questions, patient to contact team via:  Phone  Future DSME appointment: 4-6 wks

## 2017-11-14 ENCOUNTER — Other Ambulatory Visit: Payer: Self-pay | Admitting: Internal Medicine

## 2017-11-30 ENCOUNTER — Other Ambulatory Visit: Payer: Self-pay

## 2017-11-30 MED ORDER — ASPIRIN 81 MG PO TBEC: DELAYED_RELEASE_TABLET | ORAL | 1 refills | Status: DC

## 2017-12-01 ENCOUNTER — Other Ambulatory Visit: Payer: Self-pay | Admitting: Cardiovascular Disease

## 2017-12-01 MED ORDER — ASPIRIN 81 MG PO TBEC: DELAYED_RELEASE_TABLET | ORAL | 0 refills | Status: DC

## 2017-12-15 ENCOUNTER — Ambulatory Visit: Payer: BC Managed Care – PPO | Admitting: Registered"

## 2017-12-17 ENCOUNTER — Other Ambulatory Visit: Payer: Self-pay | Admitting: Internal Medicine

## 2017-12-21 NOTE — Telephone Encounter (Signed)
Done erx 

## 2017-12-23 ENCOUNTER — Other Ambulatory Visit: Payer: Self-pay | Admitting: Cardiovascular Disease

## 2018-01-02 ENCOUNTER — Ambulatory Visit: Payer: BC Managed Care – PPO | Admitting: Registered"

## 2018-01-09 ENCOUNTER — Other Ambulatory Visit: Payer: Self-pay | Admitting: Internal Medicine

## 2018-01-09 ENCOUNTER — Encounter: Payer: BC Managed Care – PPO | Attending: Internal Medicine | Admitting: Registered"

## 2018-01-09 DIAGNOSIS — Z713 Dietary counseling and surveillance: Secondary | ICD-10-CM | POA: Insufficient documentation

## 2018-01-09 DIAGNOSIS — E119 Type 2 diabetes mellitus without complications: Secondary | ICD-10-CM | POA: Diagnosis not present

## 2018-01-09 MED ORDER — ESCITALOPRAM OXALATE 10 MG PO TABS: 10.0000 mg | ORAL_TABLET | Freq: Every day | ORAL | 2 refills | Status: DC

## 2018-01-09 NOTE — Patient Instructions (Signed)
Consider adding back a few more carbs and check blood sugar 1-2 hours after. Consider using the sleep tips sheet for ideas to get more sleep. Continue to have plenty of vegetables.

## 2018-01-09 NOTE — Progress Notes (Signed)
Diabetes Self-Management Education  Visit Type: Follow-up  Appt. Start Time: 1655 Appt. End Time: 5366  01/09/2018  Mr. [name] Gross, identified by name and date of birth, is a 57 y.o. male with a diagnosis of Diabetes: Type 2.   ASSESSMENT Patient returns for follow-up appointment. After receiving general information about eating balanced meals, patient greatly reduced carb intake as well as fewer snacks and states he has lost ~12 lbs. Patetient states his FBG is around 85 mg/dL and PPBG avg 119 mg/dL. Patient was wondering if he should stop taking his 500 mg dose of metformin. RD deferred decision to MD. Patient was given more specific guidelines and can increase carb intake using SMBG as a guide.  Patient states he gets 5-6 hrs of sleep with naps during the day and on weekends will sleep in. RD discussed good sleep hygiene and effect on BG.  Diabetes Self-Management Education - 01/09/18 1700      Visit Information   Visit Type  Follow-up      Initial Visit   Diabetes Type  Type 2    Are you currently following a meal plan?  Yes    What type of meal plan do you follow?  low carb    Are you taking your medications as prescribed?  Yes      Complications   How often do you check your blood sugar?  1-2 times/day    Fasting Blood glucose range (mg/dL)  70-129 85-119    Number of hypoglycemic episodes per month  0    Number of hyperglycemic episodes per week  0      Dietary Intake   Breakfast  eggs, bacon,     Snack (morning)  fruit & protein    Lunch  grilled chicken, salad OR vegetable plate    Snack (afternoon)  fruit & protein    Dinner  Beef brisket, sweet potato fries    Snack (evening)  dry cereal    Beverage(s)  water, diet drink      Exercise   Exercise Type  ADL's      Patient Education   Nutrition management   Food label reading, portion sizes and measuring food.    Monitoring  Purpose and frequency of SMBG.    Acute complications  Taught treatment of hypoglycemia  - the 15 rule.    Chronic complications  Lipid levels, blood glucose control and heart disease      Individualized Goals (developed by patient)   Nutrition  General guidelines for healthy choices and portions discussed    Medications  take my medication as prescribed;Other (comment) if BG too low, notify MD regarding medication adjustment    Monitoring   test my blood glucose as discussed      Patient Self-Evaluation of Goals - Patient rates self as meeting previously set goals (% of time)   Nutrition  >75%    Physical Activity  < 25% being cautious s/p heart attack    Monitoring  >75%      Outcomes   Expected Outcomes  Demonstrated interest in learning. Expect positive outcomes    Future DMSE  PRN    Program Status  Completed      Subsequent Visit   Since your last visit have you continued or begun to take your medications as prescribed?  Yes    Since your last visit have you experienced any weight changes?  Loss    Weight Loss (lbs)  12    Since  your last visit, are you checking your blood glucose at least once a day?  Yes     Individualized Plan for Diabetes Self-Management Training:   Learning Objective:  Patient will have a greater understanding of diabetes self-management. Patient education plan is to attend individual and/or group sessions per assessed needs and concerns.  Patient Instructions  Consider adding back a few more carbs and check blood sugar 1-2 hours after. Consider using the sleep tips sheet for ideas to get more sleep. Continue to have plenty of vegetables.  Expected Outcomes:  Demonstrated interest in learning. Expect positive outcomes  Education material provided: Snack sheet and Carbohydrate counting sheet, Sleep Hygiene, Carb Counting and Meal Planning booklett  If problems or questions, patient to contact team via:  Phone and MyChart  Future DSME appointment: PRN

## 2018-01-10 ENCOUNTER — Encounter: Payer: Self-pay | Admitting: Registered"

## 2018-01-19 ENCOUNTER — Other Ambulatory Visit: Payer: Self-pay

## 2018-01-19 ENCOUNTER — Encounter: Payer: Self-pay | Admitting: Cardiovascular Disease

## 2018-01-19 ENCOUNTER — Ambulatory Visit: Payer: BC Managed Care – PPO | Admitting: Cardiovascular Disease

## 2018-01-19 VITALS — BP 120/78 | HR 72 | Ht 64.5 in | Wt 177.8 lb

## 2018-01-19 DIAGNOSIS — I2510 Atherosclerotic heart disease of native coronary artery without angina pectoris: Secondary | ICD-10-CM | POA: Diagnosis not present

## 2018-01-19 DIAGNOSIS — E785 Hyperlipidemia, unspecified: Secondary | ICD-10-CM

## 2018-01-19 MED ORDER — NITROGLYCERIN 0.4 MG SL SUBL: 0.4000 mg | SUBLINGUAL_TABLET | SUBLINGUAL | 3 refills | Status: DC | PRN

## 2018-01-19 NOTE — Patient Instructions (Signed)
Medication Instructions:  Your provider recommends that you continue on your current medications as directed. Please refer to the Current Medication list given to you today.    Labwork: Please have a lipid panel drawn when you see Dr. Jenny Reichmann in May.  Testing/Procedures: None  Follow-Up: Your provider wants you to follow-up in: 1 year with Dr. Burt Knack. You will receive a reminder letter in the mail two months in advance. If you don't receive a letter, please call our office to schedule the follow-up appointment.    Any Other Special Instructions Will Be Listed Below (If Applicable).     If you need a refill on your cardiac medications before your next appointment, please call your pharmacy.

## 2018-01-19 NOTE — Progress Notes (Signed)
Cardiology Office Note Date:  01/19/2018   ID:  [name] Gross, DOB [birth year], MRN [id number]  PCP:  Biagio Borg, MD  Cardiologist:  Sherren Mocha, MD    Chief Complaint  Patient presents with  . 1 year follow up    CAD     History of Present Illness: [name] Gross is a 57 y.o. male who presents for follow-up evaluation.  The patient is followed for coronary artery disease, initially presenting in 2011 with an inferior wall STEMI treated with primary PCI using bare metal stenting.  The patient had acute stent thrombosis and a second stent was placed.  LVEF is been preserved at 55-65%.  He has had no recurrent ischemic events during the past 7 years of follow-up.  The patient is here alone today.  He is been doing well.  He has a 107-month-old granddaughter and has really been enjoying spending time with her.  He has had no chest pain, chest pressure, shortness of breath, or heart palpitations.  He has no specific cardiac-related concerns today.  He has fallen out of his regular exercise routine.  He was diagnosed with diabetes last year and is started taking metformin.  Since that time he is lost 12 pounds and continues to work on further weight loss with a goal of 20 pounds total.  Past Medical History:  Diagnosis Date  . Allergic rhinitis   . Anxiety   . Asthma    mild intermittent  . CAD (coronary artery disease)    s/p INF STEMI 12/04/2010: treated with BMS to RCA c/b acute stent thrombosis at completion of  primary PCI ...above tx with second BMS with adjunctive thrombectomy residual CAD at cath 12/04/2010: pRCA 40-50%; LAD and CFX ok; EF 45% Echo 12/06/2010: Ef 55-65%; ?HK inferior wall; mild MR  . Erectile dysfunction   . GERD (gastroesophageal reflux disease)   . Glucose intolerance (impaired glucose tolerance)   . HTN (hypertension)   . Obesity   . OSA (obstructive sleep apnea)    mild, no CPAP per pt preference  . Osteoarthritis   . Prostate cancer [facility])     Past  Surgical History:  Procedure Laterality Date  . CORONARY STENT PLACEMENT     Stenting of the right coronary artery......... Successful percutaneous coronary intervention with  adjunctive thrombectomy for treatment of acute stent thrombosis and   reocclusion of the right coronary artery after treatment earlier today  of an acute myocardial infarction  . ELBOW SURGERY    . KNEE SURGERY    . NASAL SINUS SURGERY    . SPERMATOCELECTOMY    . WRIST SURGERY      Current Outpatient Medications  Medication Sig Dispense Refill  . aspirin (ASPIRIN ADULT LOW STRENGTH) 81 MG EC tablet Take 1 tablet (81 mg total) by mouth daily, swallow whole. Please keep upcoming appt for future refills. Thank you 90 tablet 0  . clobetasol cream (TEMOVATE) 2.42 % APPLY 1 APPLICATION TOPICALLY 2 (TWO) TIMES DAILY. 30 g 0  . diclofenac sodium (VOLTAREN) 1 % GEL Apply 4 g topically 4 (four) times daily as needed (pain).    Marland Kitchen escitalopram (LEXAPRO) 10 MG tablet Take 1 tablet (10 mg total) by mouth daily. 90 tablet 2  . Magnesium 500 MG CAPS Take 1 capsule by mouth daily.     . metFORMIN (GLUCOPHAGE-XR) 500 MG 24 hr tablet Take 1 tablet (500 mg total) by mouth daily with breakfast. 90 tablet 3  . rosuvastatin (CRESTOR)  5 MG tablet Take 1 tablet (5 mg total) by mouth daily. Please upcoming appt with Dr. Burt Knack in January before anymore refills. Thank you 30 tablet 0  . traMADol (ULTRAM) 50 MG tablet TAKE 1 TABLET BY MOUTH TWICE A DAY AS NEEDED FOR PAIN 60 tablet 2  . nitroGLYCERIN (NITROSTAT) 0.4 MG SL tablet Place 1 tablet (0.4 mg total) under the tongue every 5 (five) minutes as needed for chest pain. 25 tablet 3   No current facility-administered medications for this visit.     Allergies:   Amoxicillin   Social History:  The patient  reports that he has quit smoking. His smoking use included cigarettes. he has never used smokeless tobacco. He reports that he does not drink alcohol or use drugs.   Family History:  The  patient's  family history includes Breast cancer in his maternal aunt; Coronary artery disease in his father; Diabetes in his father; Esophageal cancer in his mother; Heart disease in his father, maternal aunt, maternal grandfather, maternal grandmother, maternal uncle, and mother; Hip fracture in his father; Liver cancer in his maternal uncle.    ROS:  Please see the history of present illness.  All other systems are reviewed and negative.    PHYSICAL EXAM: VS:  BP 120/78   Pulse 72   Ht 5' 4.5" (1.638 m)   Wt 177 lb 12.8 oz (80.6 kg)   BMI 30.05 kg/m  , BMI Body mass index is 30.05 kg/m. GEN: Well nourished, well developed, in no acute distress  HEENT: normal  Neck: no JVD, no masses. No carotid bruits Cardiac: RRR without murmur or gallop                Respiratory:  clear to auscultation bilaterally, normal work of breathing GI: soft, nontender, nondistended, + BS MS: no deformity or atrophy  Ext: no pretibial edema, pedal pulses 2+= bilaterally Skin: warm and dry, no rash Neuro:  Strength and sensation are intact Psych: euthymic mood, full affect  EKG:  EKG is ordered today. The ekg ordered today shows normal sinus rhythm 72 bpm, age-indeterminate inferior infarct.  Recent Labs: 10/17/2017: ALT 44; BUN 14; Creatinine, Ser 1.12; Hemoglobin 15.7; Platelets 302.0; Potassium 4.7; Sodium 139; TSH 2.38   Lipid Panel     Component Value Date/Time   CHOL 171 10/17/2017 1543   TRIG (H) 10/17/2017 1543    696.0 Triglyceride is over 400; calculations on Lipids are invalid.   TRIG 241 (HH) 10/31/2006 0917   HDL 40.40 10/17/2017 1543   CHOLHDL 4 10/17/2017 1543   VLDL 42.8 (H) 10/15/2016 0841   LDLCALC 84 10/04/2014 0828   LDLDIRECT 65.0 10/17/2017 1543      Wt Readings from Last 3 Encounters:  01/19/18 177 lb 12.8 oz (80.6 kg)  10/17/17 183 lb (83 kg)  12/06/16 179 lb 6.4 oz (81.4 kg)    ASSESSMENT AND PLAN: 1.  Coronary artery disease, native vessel, without angina: The  patient continues to do well with no cardiac symptoms on low-dose aspirin and a low-dose statin drug.  I will see him back in 1 year.  2.  Hyperlipidemia: The patient has been maintained on low-dose rosuvastatin with limited tolerance statin drugs.  We discussed lifestyle modification, diet, and exercise at length today.  He has had significant hypertriglyceridemia with triglycerides greater than 500.  He has follow-up labs scheduled in a few months and we will try to coordinate a repeat lipid panel at the time of his next  lab work.  I am hopeful that his triglycerides will come down significantly with his weight loss and use of an oral hypoglycemic agent to lower his average blood glucose.  We discussed diet and exercise efforts in relation specifically to his triglyceride level.  If his triglycerides remain in the same range, he will require further pharmacotherapy.  Current medicines are reviewed with the patient today.  The patient does not have concerns regarding medicines.  Labs/ tests ordered today include:  No orders of the defined types were placed in this encounter.   Disposition:   FU one year  Signed, Sherren Mocha, MD  01/19/2018 11:15 PM    [facility] [address], [place], [place]  [address] Phone: [phone]; Fax: [phone]

## 2018-01-23 ENCOUNTER — Telehealth: Payer: Self-pay | Admitting: Internal Medicine

## 2018-01-23 NOTE — Telephone Encounter (Signed)
Noted  

## 2018-01-23 NOTE — Telephone Encounter (Addendum)
Copied from [place] [phone]. Topic: Quick Communication - Office Called Patient >> Jan 23, 2018 11:49 AM Peace, Tammy  Left patient vm to call back to schedule 6 month fu in May. Dr. Jenny Reichmann to add lipid panel to labs for heart care.

## 2018-01-24 ENCOUNTER — Other Ambulatory Visit: Payer: Self-pay | Admitting: *Deleted

## 2018-01-24 MED ORDER — ROSUVASTATIN CALCIUM 5 MG PO TABS: 5.0000 mg | ORAL_TABLET | Freq: Every day | ORAL | 11 refills | Status: DC

## 2018-02-05 ENCOUNTER — Other Ambulatory Visit: Payer: Self-pay | Admitting: Internal Medicine

## 2018-03-13 ENCOUNTER — Ambulatory Visit: Payer: Self-pay | Admitting: *Deleted

## 2018-03-13 NOTE — Telephone Encounter (Signed)
Pt  Had  Vomiting  Chills  Yesterday. Started    Headache  Which is  persistent  Today .  Pin is  relieved    Somewhat  By   Motrin.  His  Daughter  Is  Pregnant.     Unable  To  Get   An early appointment with  His  PCP  Or office  . Appointment  Made  For  tomorrow  With Dr  Jonni Sanger   At [place]      Reason for Disposition . [1] MODERATE headache (e.g., interferes with normal activities) AND [2] present > 24 hours AND [3] unexplained  (Exceptions: analgesics not tried, typical migraine, or headache part of viral illness)  Answer Assessment - Initial Assessment Questions 1. LOCATION: "Where does it hurt?"       Top  Front  Back  And  Both  Sides    2. ONSET: "When did the headache start?" (Minutes, hours or days)         Last  Night  3. PATTERN: "Does the pain come and go, or has it been constant since it started?"          constant 4. SEVERITY: "How bad is the pain?" and "What does it keep you from doing?"  (e.g., Scale 1-10; mild, moderate, or severe)   - MILD (1-3): doesn't interfere with normal activities    - MODERATE (4-7): interferes with normal activities or awakens from sleep    - SEVERE (8-10): excruciating pain, unable to do any normal activities         7-8   5. RECURRENT SYMPTOM: "Have you ever had headaches before?" If so, ask: "When was the last time?" and "What happened that time?"          No 6. CAUSE: "What do you think is causing the headache?"         Not sure  7. MIGRAINE: "Have you been diagnosed with migraine headaches?" If so, ask: "Is this headache similar?"         no 8. HEAD INJURY: "Has there been any recent injury to the head?"         no 9. OTHER SYMPTOMS: "Do you have any other symptoms?" (fever, stiff neck, eye pain, sore throat, cold symptoms)    Vomiting     X  2  Yesterday    Body  Aches     Felt  Like  Had  A  Fever   Yesterday   Mouth  Is  Dry    10. PREGNANCY: "Is there any chance you are pregnant?" "When was your last menstrual period?"        n/a  Protocols used: HEADACHE-A-AH

## 2018-03-14 ENCOUNTER — Other Ambulatory Visit: Payer: Self-pay

## 2018-03-14 ENCOUNTER — Ambulatory Visit: Payer: BC Managed Care – PPO | Admitting: Family Medicine

## 2018-03-14 ENCOUNTER — Encounter: Payer: Self-pay | Admitting: Family Medicine

## 2018-03-14 VITALS — BP 116/78 | HR 95 | Temp 97.6°F | Ht 64.5 in | Wt 174.4 lb

## 2018-03-14 DIAGNOSIS — R52 Pain, unspecified: Secondary | ICD-10-CM

## 2018-03-14 DIAGNOSIS — K529 Noninfective gastroenteritis and colitis, unspecified: Secondary | ICD-10-CM

## 2018-03-14 LAB — POC INFLUENZA A&B (BINAX/QUICKVUE)
Influenza A, POC: NEGATIVE
Influenza B, POC: NEGATIVE

## 2018-03-14 MED ORDER — PROMETHAZINE HCL 25 MG PO TABS: 25.0000 mg | ORAL_TABLET | Freq: Four times a day (QID) | ORAL | 0 refills | Status: DC | PRN

## 2018-03-14 NOTE — Patient Instructions (Addendum)
Please follow up if symptoms do not improve or as needed.   You may use the anti-nausea medicine as needed.  Bland Diet A bland diet consists of foods that do not have a lot of fat or fiber. Foods without fat or fiber are easier for the body to digest. They are also less likely to irritate your mouth, throat, stomach, and other parts of your gastrointestinal tract. A bland diet is sometimes called a BRAT diet. What is my plan? Your health care provider or dietitian may recommend specific changes to your diet to prevent and treat your symptoms, such as: Eating small meals often. Cooking food until it is soft enough to chew easily. Chewing your food well. Drinking fluids slowly. Not eating foods that are very spicy, sour, or fatty. Not eating citrus fruits, such as oranges and grapefruit.  What do I need to know about this diet? Eat a variety of foods from the bland diet food list. Do not follow a bland diet longer than you have to. Ask your health care provider whether you should take vitamins. What foods can I eat? Grains  Hot cereals, such as cream of wheat. Bread, crackers, or tortillas made from refined white flour. Rice. Vegetables Canned or cooked vegetables. Mashed or boiled potatoes. Fruits Bananas. Applesauce. Other types of cooked or canned fruit with the skin and seeds removed, such as canned peaches or pears. Meats and Other Protein Sources Scrambled eggs. Creamy peanut butter or other nut butters. Lean, well-cooked meats, such as chicken or fish. Tofu. Soups or broths. Dairy Low-fat dairy products, such as milk, cottage cheese, or yogurt. Beverages Water. Herbal tea. Apple juice. Sweets and Desserts Pudding. Custard. Fruit gelatin. Ice cream. Fats and Oils Mild salad dressings. Canola or olive oil. The items listed above may not be a complete list of allowed foods or beverages. Contact your dietitian for more options. What foods are not recommended? Foods and  ingredients that are often not recommended include: Spicy foods, such as hot sauce or salsa. Fried foods. Sour foods, such as pickled or fermented foods. Raw vegetables or fruits, especially citrus or berries. Caffeinated drinks. Alcohol. Strongly flavored seasonings or condiments.  The items listed above may not be a complete list of foods and beverages that are not allowed. Contact your dietitian for more information. This information is not intended to replace advice given to you by your health care provider. Make sure you discuss any questions you have with your health care provider. Document Released: 03/29/2016 Document Revised: 05/13/2016 Document Reviewed: 12/18/2014 Elsevier Interactive Patient Education  2018 [employer].  Keep hydrated.  Hold your metformin until you are eating and drinking normally.   Viral Gastroenteritis, Adult Viral gastroenteritis is also known as the stomach flu. This condition is caused by certain germs (viruses). These germs can be passed from person to person very easily (are very contagious). This condition can cause sudden watery poop (diarrhea), fever, and throwing up (vomiting). Having watery poop and throwing up can make you feel weak and cause you to get dehydrated. Dehydration can make you tired and thirsty, make you have a dry mouth, and make it so you pee (urinate) less often. Older adults and people with other diseases or a weak defense system (immune system) are at higher risk for dehydration. It is important to replace the fluids that you lose from having watery poop and throwing up. Follow these instructions at home: Follow instructions from your doctor about how to care for yourself at  home. Eating and drinking  Follow these instructions as told by your doctor:  Take an oral rehydration solution (ORS). This is a drink that is sold at pharmacies and stores.  Drink clear fluids in small amounts as you are able, such as: ? Water. ? Ice  chips. ? Diluted fruit juice. ? Low-calorie sports drinks.  Eat bland, easy-to-digest foods in small amounts as you are able, such as: ? Bananas. ? Applesauce. ? Rice. ? Low-fat (lean) meats. ? Toast. ? Crackers.  Avoid fluids that have a lot of sugar or caffeine in them.  Avoid alcohol.  Avoid spicy or fatty foods.  General instructions  Drink enough fluid to keep your pee (urine) clear or pale yellow.  Wash your hands often. If you cannot use soap and water, use hand sanitizer.  Make sure that all people in your home wash their hands well and often.  Rest at home while you get better.  Take over-the-counter and prescription medicines only as told by your doctor.  Watch your condition for any changes.  Take a warm bath to help with any burning or pain from having watery poop.  Keep all follow-up visits as told by your doctor. This is important. Contact a doctor if:  You cannot keep fluids down.  Your symptoms get worse.  You have new symptoms.  You feel light-headed or dizzy.  You have muscle cramps. Get help right away if:  You have chest pain.  You feel very weak or you pass out (faint).  You see blood in your throw-up.  Your throw-up looks like coffee grounds.  You have bloody or black poop (stools) or poop that look like tar.  You have a very bad headache, a stiff neck, or both.  You have a rash.  You have very bad pain, cramping, or bloating in your belly (abdomen).  You have trouble breathing.  You are breathing very quickly.  Your heart is beating very quickly.  Your skin feels cold and clammy.  You feel confused.  You have pain when you pee.  You have signs of dehydration, such as: ? Dark pee, hardly any pee, or no pee. ? Cracked lips. ? Dry mouth. ? Sunken eyes. ? Sleepiness. ? Weakness. This information is not intended to replace advice given to you by your health care provider. Make sure you discuss any questions you have  with your health care provider. Document Released: 05/24/2008 Document Revised: 06/25/2016 Document Reviewed: 08/12/2015 Elsevier Interactive Patient Education  2017 [employer].

## 2018-03-14 NOTE — Progress Notes (Signed)
Subjective:      Subjective  CC:  Chief Complaint  Patient presents with  . Sinusitis    Patient states he has had bodyaches, headache x 2 days     HPI: [name] Gross is a 57 y.o. male who presents for evaluation of vomiting several times per day and nausea. Symptoms have been present for 3 days. Has had subjective fever with chills and malgias. No vomiting yesterday but still a bit queasy. Ate breakfast today. Patient denies blood in stool, dysuria, hematemesis, melena and mucoid stool. Patient's oral intake has been decreased for liquids and decreased for solids. Patient's urine output has been adequate. No contacts with similar symptoms. Patient denies recent travel history. Patient has not had recent ingestion of possible contaminated food, toxic plants, or inappropriate medications/poisons. No recent use of antibiotics. He denies orthostatic symptoms.  He would like to be tested for flu: his daughter is at home pregnant and wants tamiflu if he has the flu. NO cough, st, or respiratory sxs.  I reviewed the patients updated PMH, FH, and SocHx.    Patient Active Problem List   Diagnosis Date Noted  . Newly diagnosed diabetes ([place]) 11/08/2017  . Hypertriglyceridemia 10/17/2017  . Bilateral knee pain 10/17/2017  . Asthma with exacerbation 12/31/2014  . Right otitis media 10/11/2014  . Myalgia 04/12/2013  . Hemorrhoids, external, thrombosed 06/08/2012  . Rash 11/18/2011  . Preventative health care 05/20/2011  . Diabetes ([place]) 05/20/2011  . Abnormal liver enzymes 05/20/2011  . Obstructive sleep apnea 01/19/2011  . Dyslipidemia 12/22/2010  . 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 CORONARY ATHEROSCLEROSIS NATIVE CORONARY ARTERY 12/22/2010  . CHEST PAIN UNSPECIFIED 12/22/2010  . BACK PAIN 11/21/2009  . ANXIETY 10/15/2008  . Allergic rhinitis 10/15/2008  . Elevated blood pressure reading without diagnosis of hypertension 09/05/2007  . Asthma due to environmental  allergies 09/05/2007  . GERD 09/05/2007  . PROSTATE CANCER, HX OF 09/05/2007   Current Meds  Medication Sig  . aspirin (ASPIRIN ADULT LOW STRENGTH) 81 MG EC tablet Take 1 tablet (81 mg total) by mouth daily, swallow whole. Please keep upcoming appt for future refills. Thank you  . clobetasol cream (TEMOVATE) 8.46 % APPLY 1 APPLICATION TOPICALLY 2 TIMES DAILY.  Marland Kitchen diclofenac sodium (VOLTAREN) 1 % GEL Apply 4 g topically 4 (four) times daily as needed (pain).  Marland Kitchen escitalopram (LEXAPRO) 10 MG tablet Take 1 tablet (10 mg total) by mouth daily.  . Magnesium 500 MG CAPS Take 1 capsule by mouth daily.   . metFORMIN (GLUCOPHAGE-XR) 500 MG 24 hr tablet Take 1 tablet (500 mg total) by mouth daily with breakfast.  . nitroGLYCERIN (NITROSTAT) 0.4 MG SL tablet Place 1 tablet (0.4 mg total) under the tongue every 5 (five) minutes as needed for chest pain.  . rosuvastatin (CRESTOR) 5 MG tablet Take 1 tablet (5 mg total) by mouth daily. Please upcoming appt with Dr. Burt Knack in January before anymore refills. Thank you  . traMADol (ULTRAM) 50 MG tablet TAKE 1 TABLET BY MOUTH TWICE A DAY AS NEEDED FOR PAIN    Allergies: Patient is allergic to amoxicillin. Family History: Patient family history includes Breast cancer in his maternal aunt; Coronary artery disease in his father; Diabetes in his father; Esophageal cancer in his mother; Heart disease in his father, maternal aunt, maternal grandfather, maternal grandmother, maternal uncle, and mother; Hip fracture in his father; Liver cancer in his maternal uncle. Social History:  Patient  reports  that he has quit smoking. His smoking use included cigarettes. He has never used smokeless tobacco. He reports that he does not drink alcohol or use drugs.  Review of Systems: Constitutional: Negative for weight loss  Cardiovascular: negative for chest pain or palpitations Respiratory: negative for SOB or persistent cough Gastrointestinal: no severe pain, +  cramping  Objective  Vitals: BP 116/78   Pulse 95   Temp 97.6 F (36.4 C)   Ht 5' 4.5" (1.638 m)   Wt 174 lb 6.4 oz (79.1 kg)   BMI 29.47 kg/m  General: no acute distress , A&Ox3 HEENT: PEERL, conjunctiva normal, Oropharynx moist, neck is supple Cardiovascular:  RRR without murmur or gallop.  Respiratory:  Good breath sounds bilaterally, CTAB with normal respiratory effort Gastrointestinal: soft, mildly increased bowel sounds, minimally tender w/o masses rebound or guarding. No HSM Skin:  Warm, dry, no rashes Flu negative (tested per patient's request)  Assessment  1. Gastroenteritis   2. Body aches      Plan   Gastroenteritis:  Likely viral etiology given above hx and physical findings. Educated on prognosis and typical course of illness. Need to avoid dehydration. Ordered medications to assist in that if needed and discussed pushing PO fluids. See AVS for GE information.   Push fluids. Clear liquid diet with increased fluids intake advancing ot BRATS diet and then regular as tolerated. Avoid dair products and caffeine until resolved.   Hold metformin while at risk of for dehydration.  Follow up: Return in 5-7 days if not resolved. Sooner if worsening.    Commons side effects, risks, benefits, and alternatives for medications and treatment plan prescribed today were discussed, and the patient expressed understanding of the given instructions. Patient is instructed to call or message via MyChart if he/she has any questions or concerns regarding our treatment plan. No barriers to understanding were identified. We discussed Red Flag symptoms and signs in detail. Patient expressed understanding regarding what to do in case of urgent or emergency type symptoms.   Medication list was reconciled, printed and provided to the patient in AVS. Patient instructions and summary information was reviewed with the patient as documented in the AVS. This note was prepared with assistance of  Dragon voice recognition software. Occasional wrong-word or sound-a-like substitutions may have occurred due to the inherent limitations of voice recognition software  Orders Placed This Encounter  Procedures  . POC Influenza A&B(BINAX/QUICKVUE)   No orders of the defined types were placed in this encounter.

## 2018-03-31 ENCOUNTER — Ambulatory Visit: Payer: BC Managed Care – PPO | Admitting: Physician Assistant

## 2018-03-31 ENCOUNTER — Encounter: Payer: Self-pay | Admitting: Physician Assistant

## 2018-03-31 ENCOUNTER — Other Ambulatory Visit: Payer: Self-pay

## 2018-03-31 VITALS — BP 116/80 | HR 93 | Temp 98.8°F | Resp 16 | Ht 65.0 in | Wt 174.0 lb

## 2018-03-31 DIAGNOSIS — B9689 Other specified bacterial agents as the cause of diseases classified elsewhere: Secondary | ICD-10-CM | POA: Diagnosis not present

## 2018-03-31 DIAGNOSIS — J208 Acute bronchitis due to other specified organisms: Secondary | ICD-10-CM | POA: Diagnosis not present

## 2018-03-31 MED ORDER — IPRATROPIUM-ALBUTEROL 0.5-2.5 (3) MG/3ML IN SOLN
3.0000 mL | Freq: Once | RESPIRATORY_TRACT | Status: AC
  Administered 2018-03-31: 3 mL via RESPIRATORY_TRACT

## 2018-03-31 MED ORDER — DOXYCYCLINE HYCLATE 100 MG PO CAPS: 100.0000 mg | ORAL_CAPSULE | Freq: Two times a day (BID) | ORAL | 0 refills | Status: DC

## 2018-03-31 MED ORDER — ALBUTEROL SULFATE HFA 108 (90 BASE) MCG/ACT IN AERS: 2.0000 | INHALATION_SPRAY | Freq: Four times a day (QID) | RESPIRATORY_TRACT | 0 refills | Status: DC | PRN

## 2018-03-31 MED ORDER — BENZONATATE 100 MG PO CAPS: 100.0000 mg | ORAL_CAPSULE | Freq: Two times a day (BID) | ORAL | 0 refills | Status: DC | PRN

## 2018-03-31 NOTE — Patient Instructions (Signed)
Take antibiotic (Doxycycline) as directed.  Increase fluids.  Get plenty of rest. Use Mucinex for congestion. Tessalon for cough. Use albuterol inhaler as directed if needed. Take a daily probiotic (I recommend Align or Culturelle, but even Activia Yogurt may be beneficial).  A humidifier placed in the bedroom may offer some relief for a dry, scratchy throat of nasal irritation.  Read information below on acute bronchitis. Please call or return to clinic if symptoms are not improving.  Acute Bronchitis Bronchitis is when the airways that extend from the windpipe into the lungs get red, puffy, and painful (inflamed). Bronchitis often causes thick spit (mucus) to develop. This leads to a cough. A cough is the most common symptom of bronchitis. In acute bronchitis, the condition usually begins suddenly and goes away over time (usually in 2 weeks). Smoking, allergies, and asthma can make bronchitis worse. Repeated episodes of bronchitis may cause more lung problems.  HOME CARE  Rest.  Drink enough fluids to keep your pee (urine) clear or pale yellow (unless you need to limit fluids as told by your doctor).  Only take over-the-counter or prescription medicines as told by your doctor.  Avoid smoking and secondhand smoke. These can make bronchitis worse. If you are a smoker, think about using nicotine gum or skin patches. Quitting smoking will help your lungs heal faster.  Reduce the chance of getting bronchitis again by:  Washing your hands often.  Avoiding people with cold symptoms.  Trying not to touch your hands to your mouth, nose, or eyes.  Follow up with your doctor as told.  GET HELP IF: Your symptoms do not improve after 1 week of treatment. Symptoms include:  Cough.  Fever.  Coughing up thick spit.  Body aches.  Chest congestion.  Chills.  Shortness of breath.  Sore throat.  GET HELP RIGHT AWAY IF:   You have an increased fever.  You have chills.  You have severe  shortness of breath.  You have bloody thick spit (sputum).  You throw up (vomit) often.  You lose too much body fluid (dehydration).  You have a severe headache.  You faint.  MAKE SURE YOU:   Understand these instructions.  Will watch your condition.  Will get help right away if you are not doing well or get worse. Document Released: 05/24/2008 Document Revised: 08/08/2013 Document Reviewed: 05/29/2013 [facility] Patient Information 2015 [place], [place]. This information is not intended to replace advice given to you by your health care provider. Make sure you discuss any questions you have with your health care provider.

## 2018-03-31 NOTE — Progress Notes (Signed)
Patient presents to clinic today c/o chest tightness, wheezing, chest congestion and cough. Also notes R ear pressure and pain. Has a history of allergic rhinitis in his youth but denies major issues as an adult. Denies recent travel or sick contact.   Past Medical History:  Diagnosis Date  . Allergic rhinitis   . Anxiety   . Asthma    mild intermittent  . CAD (coronary artery disease)    s/p INF STEMI 12/04/2010: treated with BMS to RCA c/b acute stent thrombosis at completion of  primary PCI ...above tx with second BMS with adjunctive thrombectomy residual CAD at cath 12/04/2010: pRCA 40-50%; LAD and CFX ok; EF 45% Echo 12/06/2010: Ef 55-65%; ?HK inferior wall; mild MR  . Erectile dysfunction   . GERD (gastroesophageal reflux disease)   . Glucose intolerance (impaired glucose tolerance)   . HTN (hypertension)   . Obesity   . OSA (obstructive sleep apnea)    mild, no CPAP per pt preference  . Osteoarthritis   . Prostate cancer [facility])     Current Outpatient Medications on File Prior to Visit  Medication Sig Dispense Refill  . aspirin (ASPIRIN ADULT LOW STRENGTH) 81 MG EC tablet Take 1 tablet (81 mg total) by mouth daily, swallow whole. Please keep upcoming appt for future refills. Thank you 90 tablet 0  . clobetasol cream (TEMOVATE) 2.77 % APPLY 1 APPLICATION TOPICALLY 2 TIMES DAILY. 30 g 5  . diclofenac sodium (VOLTAREN) 1 % GEL Apply 4 g topically 4 (four) times daily as needed (pain).    Marland Kitchen escitalopram (LEXAPRO) 10 MG tablet Take 1 tablet (10 mg total) by mouth daily. 90 tablet 2  . Magnesium 500 MG CAPS Take 1 capsule by mouth daily.     . metFORMIN (GLUCOPHAGE-XR) 500 MG 24 hr tablet Take 1 tablet (500 mg total) by mouth daily with breakfast. 90 tablet 3  . nitroGLYCERIN (NITROSTAT) 0.4 MG SL tablet Place 1 tablet (0.4 mg total) under the tongue every 5 (five) minutes as needed for chest pain. 25 tablet 3  . promethazine (PHENERGAN) 25 MG tablet Take 1 tablet (25 mg total) by  mouth every 6 (six) hours as needed for nausea or vomiting. 20 tablet 0  . rosuvastatin (CRESTOR) 5 MG tablet Take 1 tablet (5 mg total) by mouth daily. Please upcoming appt with Dr. Burt Knack in January before anymore refills. Thank you 30 tablet 11  . traMADol (ULTRAM) 50 MG tablet TAKE 1 TABLET BY MOUTH TWICE A DAY AS NEEDED FOR PAIN 60 tablet 2   No current facility-administered medications on file prior to visit.     Allergies  Allergen Reactions  . Amoxicillin     Per the pt "it was years ago and does not remember the reaction"    Family History  Problem Relation Age of Onset  . Diabetes Father   . Hip fracture Father   . Coronary artery disease Father   . Heart disease Father   . Heart disease Mother   . Esophageal cancer Mother   . Heart disease Maternal Aunt   . Breast cancer Maternal Aunt   . Heart disease Maternal Uncle   . Liver cancer Maternal Uncle   . Heart disease Maternal Grandmother   . Heart disease Maternal Grandfather   . Colon cancer Neg Hx   . Rectal cancer Neg Hx   . Stomach cancer Neg Hx     Social History   Socioeconomic History  . Marital status: Married  Spouse name: Not on file  . Number of children: Not on file  . Years of education: Not on file  . Highest education level: Not on file  Occupational History  . Not on file  Social Needs  . Financial resource strain: Not on file  . Food insecurity:    Worry: Not on file    Inability: Not on file  . Transportation needs:    Medical: Not on file    Non-medical: Not on file  Tobacco Use  . Smoking status: Former Smoker    Types: Cigarettes  . Smokeless tobacco: Never Used  Substance and Sexual Activity  . Alcohol use: No  . Drug use: No  . Sexual activity: Yes  Lifestyle  . Physical activity:    Days per week: Not on file    Minutes per session: Not on file  . Stress: Not on file  Relationships  . Social connections:    Talks on phone: Not on file    Gets together: Not on file      Attends religious service: Not on file    Active member of club or organization: Not on file    Attends meetings of clubs or organizations: Not on file    Relationship status: Not on file  Other Topics Concern  . Not on file  Social History Narrative   DOT - Psychologist, clinical   Review of Systems - See HPI.  All other ROS are negative.  BP 116/80   Pulse 93   Temp 98.8 F (37.1 C) (Oral)   Resp 16   Ht 5\' 5"  (1.651 m)   Wt 174 lb (78.9 kg)   SpO2 96%   BMI 28.96 kg/m   Physical Exam  Constitutional: He is oriented to person, place, and time. He appears well-developed and well-nourished.  HENT:  Head: Normocephalic and atraumatic.  Right Ear: External ear normal.  Left Ear: External ear normal.  Nose: Nose normal.  Mouth/Throat: Oropharynx is clear and moist.  Eyes: Pupils are equal, round, and reactive to light.  Neck: Neck supple.  Cardiovascular: Normal rate, regular rhythm, normal heart sounds and intact distal pulses.  Pulmonary/Chest: Effort normal. No respiratory distress. He has wheezes. He has no rales. He exhibits no tenderness.  Neurological: He is alert and oriented to person, place, and time.  Skin: Skin is warm and dry. No rash noted.  Vitals reviewed.  Recent Results (from the past 2160 hour(s))  POC Influenza A&B(BINAX/QUICKVUE)     Status: Normal   Collection Time: 03/14/18  9:08 AM  Result Value Ref Range   Influenza A, POC Negative Negative   Influenza B, POC Negative Negative   Assessment/Plan: 1. Acute bacterial bronchitis Duoneb given with improvement in wheeze. Start Doxycycline and Tessalon. Supportive measures and OTC medications reviewed. Albuterol refilled. Follow-up if not improving.  - ipratropium-albuterol (DUONEB) 0.5-2.5 (3) MG/3ML nebulizer solution 3 mL - benzonatate (TESSALON) 100 MG capsule; Take 1 capsule (100 mg total) by mouth 2 (two) times daily as needed for cough.  Dispense: 20 capsule; Refill: 0 - doxycycline  (VIBRAMYCIN) 100 MG capsule; Take 1 capsule (100 mg total) by mouth 2 (two) times daily.  Dispense: 14 capsule; Refill: 0 - albuterol (PROVENTIL HFA;VENTOLIN HFA) 108 (90 Base) MCG/ACT inhaler; Inhale 2 puffs into the lungs every 6 (six) hours as needed for wheezing or shortness of breath.  Dispense: 1 Inhaler; Refill: 0   Leeanne Rio, PA-C

## 2018-04-25 ENCOUNTER — Other Ambulatory Visit (INDEPENDENT_AMBULATORY_CARE_PROVIDER_SITE_OTHER): Payer: BC Managed Care – PPO

## 2018-04-25 DIAGNOSIS — E119 Type 2 diabetes mellitus without complications: Secondary | ICD-10-CM | POA: Diagnosis not present

## 2018-04-25 LAB — HEPATIC FUNCTION PANEL
ALK PHOS: 63 U/L (ref 39–117)
ALT: 29 U/L (ref 0–53)
AST: 18 U/L (ref 0–37)
Albumin: 4.2 g/dL (ref 3.5–5.2)
BILIRUBIN DIRECT: 0.1 mg/dL (ref 0.0–0.3)
TOTAL PROTEIN: 6.6 g/dL (ref 6.0–8.3)
Total Bilirubin: 0.4 mg/dL (ref 0.2–1.2)

## 2018-04-25 LAB — LIPID PANEL
CHOLESTEROL: 145 mg/dL (ref 0–200)
HDL: 45.7 mg/dL (ref >39.00)
NonHDL: 99.29
Total CHOL/HDL Ratio: 3
Triglycerides: 229 mg/dL — ABNORMAL HIGH (ref 0.0–149.0)
VLDL: 45.8 mg/dL — ABNORMAL HIGH (ref 0.0–40.0)

## 2018-04-25 LAB — BASIC METABOLIC PANEL
BUN: 17 mg/dL (ref 6–23)
CO2: 29 mEq/L (ref 19–32)
CREATININE: 1.25 mg/dL (ref 0.40–1.50)
Calcium: 9.4 mg/dL (ref 8.4–10.5)
Chloride: 104 mEq/L (ref 96–112)
GFR: 63.25 mL/min (ref >60.00)
Glucose, Bld: 120 mg/dL — ABNORMAL HIGH (ref 70–99)
POTASSIUM: 4 meq/L (ref 3.5–5.1)
Sodium: 141 mEq/L (ref 135–145)

## 2018-04-25 LAB — LDL CHOLESTEROL, DIRECT: Direct LDL: 54 mg/dL

## 2018-04-25 LAB — HEMOGLOBIN A1C: HEMOGLOBIN A1C: 6.7 % — AB (ref 4.6–6.5)

## 2018-04-26 ENCOUNTER — Ambulatory Visit: Payer: BC Managed Care – PPO | Admitting: Internal Medicine

## 2018-04-26 ENCOUNTER — Encounter: Payer: Self-pay | Admitting: Internal Medicine

## 2018-04-26 VITALS — BP 118/82 | HR 65 | Temp 97.6°F | Ht 65.0 in | Wt 173.0 lb

## 2018-04-26 DIAGNOSIS — R030 Elevated blood-pressure reading, without diagnosis of hypertension: Secondary | ICD-10-CM

## 2018-04-26 DIAGNOSIS — E781 Pure hyperglyceridemia: Secondary | ICD-10-CM

## 2018-04-26 DIAGNOSIS — E119 Type 2 diabetes mellitus without complications: Secondary | ICD-10-CM

## 2018-04-26 DIAGNOSIS — Z23 Encounter for immunization: Secondary | ICD-10-CM | POA: Diagnosis not present

## 2018-04-26 DIAGNOSIS — E785 Hyperlipidemia, unspecified: Secondary | ICD-10-CM

## 2018-04-26 NOTE — Patient Instructions (Addendum)
Please remember to see your eye doctor yearly  You had the Prevnar pneumonia shot today  Please continue all other medications as before, and refills have been done if requested.  Please have the pharmacy call with any other refills you may need.  Please continue your efforts at being more active, low cholesterol diet, and weight control.  Please keep your appointments with your specialists as you may have planned  Please return in 6 months, or sooner if needed, with Lab testing done 3-5 days before

## 2018-04-26 NOTE — Assessment & Plan Note (Signed)
stable overall by history and exam, recent data reviewed with pt, and pt to continue medical treatment as before,  to f/u any worsening symptoms or concerns  

## 2018-04-26 NOTE — Progress Notes (Signed)
Subjective:    Patient ID: [name] Gross, male    DOB: June 08, 1961, 57 y.o.   MRN: [id number]  HPI   Here to f/u; overall doing ok,  Pt denies chest pain, increasing sob or doe, wheezing, orthopnea, PND, increased LE swelling, palpitations, dizziness or syncope.  Pt denies new neurological symptoms such as new headache, or facial or extremity weakness or numbness.  Pt denies polydipsia, polyuria, or low sugar episode.  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 Overall lost 12 lbs since 2018 per pt, with better diet.   Wt Readings from Last 3 Encounters:  04/26/18 173 lb (78.5 kg)  03/31/18 174 lb (78.9 kg)  03/14/18 174 lb 6.4 oz (79.1 kg)   Past Medical History:  Diagnosis Date  . Allergic rhinitis   . Anxiety   . Asthma    mild intermittent  . CAD (coronary artery disease)    s/p INF STEMI 12/04/2010: treated with BMS to RCA c/b acute stent thrombosis at completion of  primary PCI ...above tx with second BMS with adjunctive thrombectomy residual CAD at cath 12/04/2010: pRCA 40-50%; LAD and CFX ok; EF 45% Echo 12/06/2010: Ef 55-65%; ?HK inferior wall; mild MR  . Erectile dysfunction   . GERD (gastroesophageal reflux disease)   . Glucose intolerance (impaired glucose tolerance)   . HTN (hypertension)   . Obesity   . OSA (obstructive sleep apnea)    mild, no CPAP per pt preference  . Osteoarthritis   . Prostate cancer [facility])    Past Surgical History:  Procedure Laterality Date  . CORONARY STENT PLACEMENT     Stenting of the right coronary artery......... Successful percutaneous coronary intervention with  adjunctive thrombectomy for treatment of acute stent thrombosis and   reocclusion of the right coronary artery after treatment earlier today  of an acute myocardial infarction  . ELBOW SURGERY    . KNEE SURGERY    . NASAL SINUS SURGERY    . SPERMATOCELECTOMY    . WRIST SURGERY      reports that he has quit smoking. His smoking use included cigarettes. He has never used smokeless tobacco. He reports that he does not drink alcohol or use drugs. family history includes Breast cancer in his maternal aunt; Coronary artery disease in his father; Diabetes in his father; Esophageal cancer in his mother; Heart disease in his father, maternal aunt, maternal grandfather, maternal grandmother, maternal uncle, and mother; Hip fracture in his father; Liver cancer in his maternal uncle. Allergies  Allergen Reactions  . Amoxicillin     Per the pt "it was years ago and does not remember the reaction"   Current Outpatient Medications on File Prior to Visit  Medication Sig Dispense Refill  . albuterol (PROVENTIL HFA;VENTOLIN HFA) 108 (90 Base) MCG/ACT inhaler Inhale 2 puffs into the lungs every 6 (six) hours as needed for wheezing or shortness of breath. 1 Inhaler 0  . aspirin (ASPIRIN ADULT LOW STRENGTH) 81 MG EC tablet Take 1 tablet (81 mg total) by mouth daily, swallow whole. Please keep upcoming appt for future refills. Thank you 90 tablet 0  . clobetasol cream (TEMOVATE) 4.48 % APPLY 1 APPLICATION TOPICALLY 2 TIMES DAILY. 30 g 5  . diclofenac sodium (VOLTAREN) 1 % GEL Apply 4 g topically 4 (four) times daily as needed (pain).    Marland Kitchen escitalopram (LEXAPRO) 10 MG tablet Take 1 tablet (10 mg total) by  mouth daily. 90 tablet 2  . Magnesium 500 MG CAPS Take 1 capsule by mouth daily.     . metFORMIN (GLUCOPHAGE-XR) 500 MG 24 hr tablet Take 1 tablet (500 mg total) by mouth daily with breakfast. 90 tablet 3  . nitroGLYCERIN (NITROSTAT) 0.4 MG SL tablet Place 1 tablet (0.4 mg total) under the tongue every 5 (five) minutes as needed for chest pain. 25 tablet 3  . rosuvastatin (CRESTOR) 5 MG tablet Take 1 tablet (5 mg total) by mouth daily. Please upcoming appt with Dr. Burt Knack in January before anymore refills. Thank you 30 tablet 11  . traMADol (ULTRAM) 50 MG tablet TAKE 1 TABLET BY MOUTH TWICE A DAY  AS NEEDED FOR PAIN 60 tablet 2   No current facility-administered medications on file prior to visit.    Review of Systems  Constitutional: Negative for other unusual diaphoresis or sweats HENT: Negative for ear discharge or swelling Eyes: Negative for other worsening visual disturbances Respiratory: Negative for stridor or other swelling  Gastrointestinal: Negative for worsening distension or other blood Genitourinary: Negative for retention or other urinary change Musculoskeletal: Negative for other MSK pain or swelling Skin: Negative for color change or other new lesions Neurological: Negative for worsening tremors and other numbness  Psychiatric/Behavioral: Negative for worsening agitation or other fatigue All other system neg per pt    Objective:   Physical Exam BP 118/82   Pulse 65   Temp 97.6 F (36.4 C) (Oral)   Ht 5\' 5"  (1.651 m)   Wt 173 lb (78.5 kg)   SpO2 98%   BMI 28.79 kg/m  VS noted, not ill appearing Constitutional: Pt appears in NAD HENT: Head: NCAT.  Right Ear: External ear normal.  Left Ear: External ear normal.  Eyes: . Pupils are equal, round, and reactive to light. Conjunctivae and EOM are normal Nose: without d/c or deformity Neck: Neck supple. Gross normal ROM Cardiovascular: Normal rate and regular rhythm.   Pulmonary/Chest: Effort normal and breath sounds without rales or wheezing.  Abd:  Soft, NT, ND, + BS, no organomegaly Neurological: Pt is alert. At baseline orientation, motor grossly intact Skin: Skin is warm. No rashes, other new lesions, no LE edema Psychiatric: Pt behavior is normal without agitation  No other exam findings Lab Results  Component Value Date   WBC 7.9 10/17/2017   HGB 15.7 10/17/2017   HCT 46.4 10/17/2017   PLT 302.0 10/17/2017   GLUCOSE 120 (H) 04/25/2018   CHOL 145 04/25/2018   TRIG 229.0 (H) 04/25/2018   HDL 45.70 04/25/2018   LDLDIRECT 54.0 04/25/2018   LDLCALC 84 10/04/2014   ALT 29 04/25/2018   AST 18  04/25/2018   NA 141 04/25/2018   K 4.0 04/25/2018   CL 104 04/25/2018   CREATININE 1.25 04/25/2018   BUN 17 04/25/2018   CO2 29 04/25/2018   TSH 2.38 10/17/2017   PSA 0.04 (L) 10/17/2017   INR 1.00 12/22/2010   HGBA1C 6.7 (H) 04/25/2018       Assessment & Plan:

## 2018-04-26 NOTE — Assessment & Plan Note (Signed)
Much improved, cont low fat DM diet

## 2018-04-26 NOTE — Addendum Note (Signed)
Addended by: Juliet Rude on: 04/26/2018 09:35 AM   Modules accepted: Orders

## 2018-06-05 ENCOUNTER — Other Ambulatory Visit: Payer: Self-pay | Admitting: Internal Medicine

## 2018-06-06 NOTE — Telephone Encounter (Signed)
Done erx 

## 2018-07-18 ENCOUNTER — Encounter: Payer: Self-pay | Admitting: Radiation Oncology

## 2018-07-18 DIAGNOSIS — C61 Malignant neoplasm of prostate: Secondary | ICD-10-CM | POA: Insufficient documentation

## 2018-07-18 NOTE — Progress Notes (Signed)
GU Location of Tumor / Histology: prostatic adenocarcinoma s/p robotic radical prostatectomy on 02/16/2007  If Prostate Cancer, Gleason Score is (3 + 4). His most recent PSA is 0.05 (06/20/2018).  09/28/17 PSA   0.04 09/28/16 PSA   0.02 06/19/13  PSA  <0.01 04/17/12 PSA  <0.01 03/05/11 PSA  <0.01 01/30/10 PSA  <0.04 07/25/09  PSA  <0.04    Past/Anticipated interventions by urology, if any: prostate biopsy, prostatectomy, PSA surveillance, referral to radiation oncology for consideration of salvage prostate radiation  Past/Anticipated interventions by medical oncology, if any: no  Weight changes, if any: no  Bowel/Bladder complaints, if any: IPSS 3. Stream is good but he has some hesitancy. Over the past year he has had worsening incontinence with primarily post void dribbling associated with fatigue.  Denies dysuria or hematuria.   Nausea/Vomiting, if any: no  Pain issues, if any:  Chronic bilateral knee pain related to arthritis.  SAFETY ISSUES:  Prior radiation? no  Pacemaker/ICD? no  Possible current pregnancy? no  Is the patient on methotrexate? no  Current Complaints / other details:  57 year old male. NKDA. Married with one son and one daughter. 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 Mother with hx of laryngeal cancer.  Dr. Jeffie Pollock awaits recommendations from RAD ONC reference 6 months of ADT with radiation.

## 2018-07-19 ENCOUNTER — Ambulatory Visit
Admission: RE | Admit: 2018-07-19 | Discharge: 2018-07-19 | Disposition: A | Discharge status: Home / Self Care | Payer: BC Managed Care – PPO | Source: Ambulatory Visit | Attending: Radiation Oncology | Admitting: Radiation Oncology

## 2018-07-19 ENCOUNTER — Encounter: Payer: Self-pay | Admitting: Radiation Oncology

## 2018-07-19 ENCOUNTER — Other Ambulatory Visit: Payer: Self-pay

## 2018-07-19 DIAGNOSIS — M1990 Unspecified osteoarthritis, unspecified site: Secondary | ICD-10-CM | POA: Insufficient documentation

## 2018-07-19 DIAGNOSIS — Z7984 Long term (current) use of oral hypoglycemic drugs: Secondary | ICD-10-CM | POA: Diagnosis not present

## 2018-07-19 DIAGNOSIS — I10 Essential (primary) hypertension: Secondary | ICD-10-CM | POA: Insufficient documentation

## 2018-07-19 DIAGNOSIS — Z87891 Personal history of nicotine dependence: Secondary | ICD-10-CM | POA: Insufficient documentation

## 2018-07-19 DIAGNOSIS — I252 Old myocardial infarction: Secondary | ICD-10-CM | POA: Diagnosis not present

## 2018-07-19 DIAGNOSIS — C61 Malignant neoplasm of prostate: Secondary | ICD-10-CM

## 2018-07-19 DIAGNOSIS — Z880 Allergy status to penicillin: Secondary | ICD-10-CM | POA: Insufficient documentation

## 2018-07-19 DIAGNOSIS — Z7982 Long term (current) use of aspirin: Secondary | ICD-10-CM | POA: Insufficient documentation

## 2018-07-19 DIAGNOSIS — I2510 Atherosclerotic heart disease of native coronary artery without angina pectoris: Secondary | ICD-10-CM | POA: Diagnosis not present

## 2018-07-19 DIAGNOSIS — Z955 Presence of coronary angioplasty implant and graft: Secondary | ICD-10-CM | POA: Diagnosis not present

## 2018-07-19 DIAGNOSIS — Z79899 Other long term (current) drug therapy: Secondary | ICD-10-CM | POA: Insufficient documentation

## 2018-07-19 NOTE — Progress Notes (Signed)
See progress note under physician encounter. 

## 2018-07-19 NOTE — Progress Notes (Signed)
Radiation Oncology         (336) [phone] ________________________________  Initial Outpatient Consultation  Name: [name] Gross MRN: [id number]  Date: 07/19/2018  DOB: Oct 18, 1961  DU:KGUR, Hunt Oris, MD  Irine Seal, MD   REFERRING PHYSICIAN: Irine Seal, MD  DIAGNOSIS: 57 y.o. gentleman with rising, detectable PSA of 0.05 s/p prostatectomy for pT3aN0Mx, Gleason 3+4 adenocarcinoma of the prostate with pretreatment PSA of 5.57.    ICD-10-CM   1. Recurrent prostate cancer ([place]) C61     HISTORY OF PRESENT ILLNESS: [name] Gross is a 57 y.o. male with a diagnosis of prostate cancer. He was originally diagnosed with Gleason 3+4 adenocarcinoma of the prostate in December 2007 with a pretreatment PSA of 5.57.  He elected at that time to proceed with prostatectomy for treatment of his prostate cancer.  He underwent a radical prostatectomy with pelvic lymph node dissection on 02/16/2007 with Dr. Jeffie Pollock.  Final pathology revealed a pT3aN0MX, Gleason 3+4 adenocarcinoma of the prostate with evidence of extracapsular extension but negative margins and no seminal vesicle or nodal involvement.  His initial posttreatment PSA was undetectable at <0.01 and remained at that level until approximately 2013 when it was noted to be 0.01.  His PSA has risen very slowly since that time with a PSA of 0.02 in 2017, 0.04 in October 2018 and 0.05 on 06/20/2018.    He has remained in very close follow-up with Dr. Jeffie Pollock and presents today to discuss the possible role of salvage radiotherapy, with or without androgen deprivation therapy.  Of note, his PMH is significant for CAD with AMI in 11/2010, two "back to back within an hour." He has 2 coronary stents and is followed closely by his cardiologist, Dr. Burt Knack.  PREVIOUS RADIATION THERAPY: No  PAST MEDICAL HISTORY:  Past Medical History:  Diagnosis Date  . Allergic rhinitis   . Anxiety   . Asthma    mild intermittent  . CAD (coronary artery disease)    s/p INF STEMI  12/04/2010: treated with BMS to RCA c/b acute stent thrombosis at completion of  primary PCI ...above tx with second BMS with adjunctive thrombectomy residual CAD at cath 12/04/2010: pRCA 40-50%; LAD and CFX ok; EF 45% Echo 12/06/2010: Ef 55-65%; ?HK inferior wall; mild MR  . Erectile dysfunction   . GERD (gastroesophageal reflux disease)   . Glucose intolerance (impaired glucose tolerance)   . HTN (hypertension)   . Obesity   . OSA (obstructive sleep apnea)    mild, no CPAP per pt preference  . Osteoarthritis   . Prostate cancer ([place])       PAST SURGICAL HISTORY: Past Surgical History:  Procedure Laterality Date  . CORONARY STENT PLACEMENT     Stenting of the right coronary artery......... Successful percutaneous coronary intervention with  adjunctive thrombectomy for treatment of acute stent thrombosis and   reocclusion of the right coronary artery after treatment earlier today  of an acute myocardial infarction  . ELBOW SURGERY    . KNEE SURGERY    . NASAL SINUS SURGERY    . SPERMATOCELECTOMY    . WRIST SURGERY      FAMILY HISTORY:  Family History  Problem Relation Age of Onset  . Diabetes Father   . Hip fracture Father   . Coronary artery disease Father   . Heart disease Father   . Heart disease Mother   . Esophageal cancer Mother   . Heart disease Maternal Aunt   . Breast cancer Maternal Aunt   .  Cancer Maternal Aunt        breast  . Heart disease Maternal Uncle   . Liver cancer Maternal Uncle   . Heart disease Maternal Grandmother   . Heart disease Maternal Grandfather   . Prostate cancer Brother   . Colon cancer Maternal Aunt   . Rectal cancer Neg Hx   . Stomach cancer Neg Hx     SOCIAL HISTORY:  Social History   Socioeconomic History  . Marital status: Married    Spouse name: Not on file  . Number of children: 2  . Years of education: Not on file  . Highest education level: Not on file  Occupational History    Comment: full time  Social Needs  .  Financial resource strain: Not on file  . Food insecurity:    Worry: Not on file    Inability: Not on file  . Transportation needs:    Medical: Not on file    Non-medical: Not on file  Tobacco Use  . Smoking status: Former Smoker    Packs/day: 0.50    Years: 15.00    Pack years: 7.50    Types: Cigarettes    Last attempt to quit: 10/20/1982    Years since quitting: 35.7  . Smokeless tobacco: Never Used  Substance and Sexual Activity  . Alcohol use: No  . Drug use: No  . Sexual activity: Yes  Lifestyle  . Physical activity:    Days per week: Not on file    Minutes per session: Not on file  . Stress: Not on file  Relationships  . Social connections:    Talks on phone: Not on file    Gets together: Not on file    Attends religious service: Not on file    Active member of club or organization: Not on file    Attends meetings of clubs or organizations: Not on file    Relationship status: Not on file  . Intimate partner violence:    Fear of current or ex partner: Not on file    Emotionally abused: Not on file    Physically abused: Not on file    Forced sexual activity: Not on file  Other Topics Concern  . 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 Resides in [place] with wife. Has one son and one daughter.    ALLERGIES: Amoxicillin  MEDICATIONS:  Current Outpatient Medications  Medication Sig Dispense Refill  . aspirin (ASPIRIN ADULT LOW STRENGTH) 81 MG EC tablet Take 1 tablet (81 mg total) by mouth daily, swallow whole. Please keep upcoming appt for future refills. Thank you 90 tablet 0  . escitalopram (LEXAPRO) 10 MG tablet Take 1 tablet (10 mg total) by mouth daily. 90 tablet 2  . Magnesium 500 MG CAPS Take 1 capsule by mouth daily.     . metFORMIN (GLUCOPHAGE-XR) 500 MG 24 hr tablet Take 1 tablet (500 mg total) by mouth daily with breakfast. 90 tablet 3  . rosuvastatin (CRESTOR) 5 MG tablet Take 1 tablet (5 mg total) by mouth daily. Please  upcoming appt with Dr. Burt Knack in January before anymore refills. Thank you 30 tablet 11  . traMADol (ULTRAM) 50 MG tablet TAKE 1 TABLET BY MOUTH TWICE A DAY AS NEEDED FOR PAIN 60 tablet 2  . clobetasol cream (TEMOVATE) 7.35 % APPLY 1 APPLICATION TOPICALLY 2 TIMES DAILY. (Patient not taking: Reported on 07/19/2018) 30 g 5  . nitroGLYCERIN (NITROSTAT) 0.4 MG SL tablet  Place 1 tablet (0.4 mg total) under the tongue every 5 (five) minutes as needed for chest pain. (Patient not taking: Reported on 07/19/2018) 25 tablet 3   No current facility-administered medications for this encounter.     REVIEW OF SYSTEMS:  On review of systems, the patient reports that he is doing well overall. He denies any chest pain, shortness of breath, cough, fevers, chills, night sweats, unintended weight changes. He reports taking a laxative occasionally but in general remains regular regarding BMs.   He denies abdominal pain, nausea or vomiting. He denies any new musculoskeletal or joint aches or pains. His IPSS was 3, indicating mild urinary symptoms. He has regained excellent bladder function since his prostatectomy but has occasional minimal leakage at night when his bladder is relaxed and occasional bladder irritation/suprapubic discomfort when he postpones voiding too long.  The irritation is relieved with voiding.  He is able to complete sexual activity with most attempts. A complete review of systems is obtained and is otherwise negative.   PHYSICAL EXAM:  Wt Readings from Last 3 Encounters:  04/26/18 173 lb (78.5 kg)  03/31/18 174 lb (78.9 kg)  03/14/18 174 lb 6.4 oz (79.1 kg)   Temp Readings from Last 3 Encounters:  07/19/18 98 F (36.7 C) (Oral)  04/26/18 97.6 F (36.4 C) (Oral)  03/31/18 98.8 F (37.1 C) (Oral)   BP Readings from Last 3 Encounters:  07/19/18 121/81  04/26/18 118/82  03/31/18 116/80   Pulse Readings from Last 3 Encounters:  07/19/18 67  04/26/18 65  03/31/18 93   Pain Assessment Pain  Score: 0-No pain/10  In general this is a well appearing Caucasian man in no acute distress. He is alert and oriented x4 and appropriate throughout the examination. HEENT reveals that the patient is normocephalic, atraumatic. EOMs are intact. PERRLA. Skin is intact without any evidence of gross lesions. Cardiovascular exam reveals a regular rate and rhythm, no clicks rubs or murmurs are auscultated. Chest is clear to auscultation bilaterally. Lymphatic assessment is performed and does not reveal any adenopathy in the cervical, supraclavicular, axillary, or inguinal chains. Abdomen has active bowel sounds in all quadrants and is intact. The abdomen is soft, non tender, non distended. Lower extremities are negative for pretibial pitting edema, deep calf tenderness, cyanosis or clubbing.   KPS = 100  100 - Normal; no complaints; no evidence of disease. 90   - Able to carry on normal activity; minor signs or symptoms of disease. 80   - Normal activity with effort; some signs or symptoms of disease. 17   - Cares for self; unable to carry on normal activity or to do active work. 60   - Requires occasional assistance, but is able to care for most of his personal needs. 50   - Requires considerable assistance and frequent medical care. 60   - Disabled; requires special care and assistance. 50   - Severely disabled; hospital admission is indicated although death not imminent. 24   - Very sick; hospital admission necessary; active supportive treatment necessary. 10   - Moribund; fatal processes progressing rapidly. 0     - Dead  Karnofsky DA, Abelmann [place], Craver LS and Burchenal [facility] [phone]) The use of the nitrogen mustards in the palliative treatment of carcinoma: with particular reference to bronchogenic carcinoma Cancer 1 634-56  LABORATORY DATA:  Lab Results  Component Value Date   WBC 7.9 10/17/2017   HGB 15.7 10/17/2017   HCT 46.4 10/17/2017   MCV  88.3 10/17/2017   PLT 302.0 10/17/2017   Lab  Results  Component Value Date   NA 141 04/25/2018   K 4.0 04/25/2018   CL 104 04/25/2018   CO2 29 04/25/2018   Lab Results  Component Value Date   ALT 29 04/25/2018   AST 18 04/25/2018   ALKPHOS 63 04/25/2018   BILITOT 0.4 04/25/2018     RADIOGRAPHY: No results found.    IMPRESSION/PLAN: 1. 57 y.o. gentleman with rising, detectable PSA of 0.05 s/p prostatectomy for pT3aN0Mx, Gleason 3+4 adenocarcinoma of the prostate with pretreatment PSA of 5.57.  Today we reviewed the findings and workup thus far.  We discussed the natural history of prostate cancer.  We reviewed the the implications of positive margins, extracapsular extension, and seminal vesicle involvement on the risk of prostate cancer recurrence especially in light of rising, detectable PSA postoperatively.  We discussed radiation treatment directed to the prostatic fossa with regard to the logistics and delivery of external beam radiation treatment.  The recommendation is for a 7.5 week course of daily radiotherapy to the prostatic fossa with or without the combination of ADT.  We discussed the role of ST-ADT combined with radiotherapy in the setting as well as the associated side effects, risks and benefits of this therapy.  Given his length of time from treatment to disease recurrence (>10 yrs), slow rise in PSA and history of heart disease, the risks of ADT are felt to outweigh the benefits in his case.  He was encouraged to ask questions were answered to  his stated satisfaction.  The patient would like to proceed with salvage radiotherapy to the prostatic fossa as recommended without the use of ADT.  We will share our findings with Dr. Jeffie Pollock and move forward with coordinating treatment.  He has freely signed written consent today in the office and a copy of this document has been placed in his chart.  He is scheduled for CT simulation on 07/20/2018 at 3 PM in anticipation of beginning radiotherapy in the near future.  He works until  3:30pm, so afternoon treatment appointments would be preferred.  We enjoyed meeting with him and his wife today, and will look forward to participating in the care of this very nice gentleman.   We spent 60 minutes face to face with the patient and more than 50% of that time was spent in counseling and/or coordination of care.     Nicholos Johns, PA-C    Tyler Pita, MD  [facility] Direct Dial: [phone]  Fax: [phone] [email or web address]  Skype  [email or web address]   This document serves as a record of services personally performed by [employer], PA-C and Dr. Tammi Klippel. It was created on their behalf by Wilburn Mylar, a trained medical scribe. The creation of this record is based on the scribe's personal observations and the provider's statements to them. This document has been checked and approved by the attending provider.

## 2018-07-20 ENCOUNTER — Ambulatory Visit
Admission: RE | Admit: 2018-07-20 | Discharge: 2018-07-20 | Disposition: A | Discharge status: Home / Self Care | Payer: BC Managed Care – PPO | Source: Ambulatory Visit | Attending: Radiation Oncology | Admitting: Radiation Oncology

## 2018-07-20 ENCOUNTER — Encounter: Payer: Self-pay | Admitting: Medical Oncology

## 2018-07-20 DIAGNOSIS — C61 Malignant neoplasm of prostate: Secondary | ICD-10-CM | POA: Diagnosis not present

## 2018-07-20 DIAGNOSIS — Z510 Encounter for antineoplastic radiation therapy: Secondary | ICD-10-CM | POA: Diagnosis present

## 2018-07-20 NOTE — Progress Notes (Signed)
Introduced myself to [name] Gross and his wife as the prostate nurse navigator and my role. I was unable to meet him yesterday when he consulted with Dr. Tammi Klippel. He is a 11 year prostate cancer survivor. His PSA has slowly been rising and he has decided to move forward with radiation. I gave them my business card and asked them to call with questions or concerns.

## 2018-07-20 NOTE — Progress Notes (Signed)
  Radiation Oncology         (336) [phone] ________________________________  Name: [name] Gross MRN: [id number]  Date: 07/20/2018  DOB: 1961/10/23  SIMULATION AND TREATMENT PLANNING NOTE    ICD-10-CM   1. Recurrent prostate cancer ([place]) C61     DIAGNOSIS:  57 y.o. gentleman with rising, detectable PSA of 0.05 s/p prostatectomy for pT3aN0Mx, Gleason 3+4 adenocarcinoma of the prostate with pretreatment PSA of 5.57.  NARRATIVE:  The patient was brought to the [facility].  Identity was confirmed.  All relevant records and images related to the planned course of therapy were reviewed.  The patient freely provided informed written consent to proceed with treatment after reviewing the details related to the planned course of therapy. The consent form was witnessed and verified by the simulation staff.  Then, the patient was set-up in a stable reproducible supine position for radiation therapy.  A vacuum lock pillow device was custom fabricated to position his legs in a reproducible immobilized position.  Then, I performed a urethrogram under sterile conditions to identify the prostatic apex.  CT images were obtained.  Surface markings were placed.  The CT images were loaded into the planning software.  Then the prostate target and avoidance structures including the rectum, bladder, bowel and hips were contoured.  Treatment planning then occurred.  The radiation prescription was entered and confirmed.  A total of one complex treatment devices was fabricated. I have requested : Intensity Modulated Radiotherapy (IMRT) is medically necessary for this case for the following reason:  Rectal sparing.Marland Kitchen  PLAN:  The patient will receive 68.4 Gy in 38 fractions.  ________________________________  Sheral Apley Tammi Klippel, M.D.

## 2018-07-26 DIAGNOSIS — C61 Malignant neoplasm of prostate: Secondary | ICD-10-CM | POA: Diagnosis not present

## 2018-07-31 ENCOUNTER — Ambulatory Visit
Admission: RE | Admit: 2018-07-31 | Discharge: 2018-07-31 | Disposition: A | Discharge status: Home / Self Care | Payer: BC Managed Care – PPO | Source: Ambulatory Visit | Attending: Radiation Oncology | Admitting: Radiation Oncology

## 2018-07-31 ENCOUNTER — Encounter: Payer: Self-pay | Admitting: Medical Oncology

## 2018-07-31 DIAGNOSIS — C61 Malignant neoplasm of prostate: Secondary | ICD-10-CM | POA: Diagnosis not present

## 2018-08-01 ENCOUNTER — Ambulatory Visit
Admission: RE | Admit: 2018-08-01 | Discharge: 2018-08-01 | Disposition: A | Discharge status: Home / Self Care | Payer: BC Managed Care – PPO | Source: Ambulatory Visit | Attending: Radiation Oncology | Admitting: Radiation Oncology

## 2018-08-01 DIAGNOSIS — C61 Malignant neoplasm of prostate: Secondary | ICD-10-CM | POA: Diagnosis not present

## 2018-08-02 ENCOUNTER — Ambulatory Visit
Admission: RE | Admit: 2018-08-02 | Discharge: 2018-08-02 | Disposition: A | Discharge status: Home / Self Care | Payer: BC Managed Care – PPO | Source: Ambulatory Visit | Attending: Radiation Oncology | Admitting: Radiation Oncology

## 2018-08-02 DIAGNOSIS — C61 Malignant neoplasm of prostate: Secondary | ICD-10-CM | POA: Diagnosis not present

## 2018-08-03 ENCOUNTER — Ambulatory Visit
Admission: RE | Admit: 2018-08-03 | Discharge: 2018-08-03 | Disposition: A | Discharge status: Home / Self Care | Payer: BC Managed Care – PPO | Source: Ambulatory Visit | Attending: Radiation Oncology | Admitting: Radiation Oncology

## 2018-08-03 DIAGNOSIS — C61 Malignant neoplasm of prostate: Secondary | ICD-10-CM | POA: Diagnosis not present

## 2018-08-04 ENCOUNTER — Ambulatory Visit
Admission: RE | Admit: 2018-08-04 | Discharge: 2018-08-04 | Disposition: A | Discharge status: Home / Self Care | Payer: BC Managed Care – PPO | Source: Ambulatory Visit | Attending: Radiation Oncology | Admitting: Radiation Oncology

## 2018-08-04 DIAGNOSIS — C61 Malignant neoplasm of prostate: Secondary | ICD-10-CM | POA: Diagnosis not present

## 2018-08-07 ENCOUNTER — Ambulatory Visit
Admission: RE | Admit: 2018-08-07 | Discharge: 2018-08-07 | Disposition: A | Discharge status: Home / Self Care | Payer: BC Managed Care – PPO | Source: Ambulatory Visit | Attending: Radiation Oncology | Admitting: Radiation Oncology

## 2018-08-07 DIAGNOSIS — C61 Malignant neoplasm of prostate: Secondary | ICD-10-CM | POA: Diagnosis not present

## 2018-08-08 ENCOUNTER — Ambulatory Visit
Admission: RE | Admit: 2018-08-08 | Discharge: 2018-08-08 | Disposition: A | Discharge status: Home / Self Care | Payer: BC Managed Care – PPO | Source: Ambulatory Visit | Attending: Radiation Oncology | Admitting: Radiation Oncology

## 2018-08-08 DIAGNOSIS — C61 Malignant neoplasm of prostate: Secondary | ICD-10-CM | POA: Diagnosis not present

## 2018-08-09 ENCOUNTER — Ambulatory Visit
Admission: RE | Admit: 2018-08-09 | Discharge: 2018-08-09 | Disposition: A | Discharge status: Home / Self Care | Payer: BC Managed Care – PPO | Source: Ambulatory Visit | Attending: Radiation Oncology | Admitting: Radiation Oncology

## 2018-08-09 DIAGNOSIS — C61 Malignant neoplasm of prostate: Secondary | ICD-10-CM | POA: Diagnosis not present

## 2018-08-10 ENCOUNTER — Ambulatory Visit
Admission: RE | Admit: 2018-08-10 | Discharge: 2018-08-10 | Disposition: A | Discharge status: Home / Self Care | Payer: BC Managed Care – PPO | Source: Ambulatory Visit | Attending: Radiation Oncology | Admitting: Radiation Oncology

## 2018-08-10 DIAGNOSIS — C61 Malignant neoplasm of prostate: Secondary | ICD-10-CM | POA: Diagnosis not present

## 2018-08-11 ENCOUNTER — Ambulatory Visit
Admission: RE | Admit: 2018-08-11 | Discharge: 2018-08-11 | Disposition: A | Discharge status: Home / Self Care | Payer: BC Managed Care – PPO | Source: Ambulatory Visit | Attending: Radiation Oncology | Admitting: Radiation Oncology

## 2018-08-11 DIAGNOSIS — C61 Malignant neoplasm of prostate: Secondary | ICD-10-CM | POA: Diagnosis not present

## 2018-08-14 ENCOUNTER — Ambulatory Visit
Admission: RE | Admit: 2018-08-14 | Discharge: 2018-08-14 | Disposition: A | Discharge status: Home / Self Care | Payer: BC Managed Care – PPO | Source: Ambulatory Visit | Attending: Radiation Oncology | Admitting: Radiation Oncology

## 2018-08-14 DIAGNOSIS — C61 Malignant neoplasm of prostate: Secondary | ICD-10-CM | POA: Diagnosis not present

## 2018-08-15 ENCOUNTER — Ambulatory Visit
Admission: RE | Admit: 2018-08-15 | Discharge: 2018-08-15 | Disposition: A | Discharge status: Home / Self Care | Payer: BC Managed Care – PPO | Source: Ambulatory Visit | Attending: Radiation Oncology | Admitting: Radiation Oncology

## 2018-08-15 DIAGNOSIS — C61 Malignant neoplasm of prostate: Secondary | ICD-10-CM | POA: Diagnosis not present

## 2018-08-16 ENCOUNTER — Ambulatory Visit
Admission: RE | Admit: 2018-08-16 | Discharge: 2018-08-16 | Disposition: A | Discharge status: Home / Self Care | Payer: BC Managed Care – PPO | Source: Ambulatory Visit | Attending: Radiation Oncology | Admitting: Radiation Oncology

## 2018-08-16 DIAGNOSIS — C61 Malignant neoplasm of prostate: Secondary | ICD-10-CM | POA: Diagnosis not present

## 2018-08-17 ENCOUNTER — Ambulatory Visit
Admission: RE | Admit: 2018-08-17 | Discharge: 2018-08-17 | Disposition: A | Discharge status: Home / Self Care | Payer: BC Managed Care – PPO | Source: Ambulatory Visit | Attending: Radiation Oncology | Admitting: Radiation Oncology

## 2018-08-17 DIAGNOSIS — C61 Malignant neoplasm of prostate: Secondary | ICD-10-CM | POA: Diagnosis not present

## 2018-08-18 ENCOUNTER — Ambulatory Visit
Admission: RE | Admit: 2018-08-18 | Discharge: 2018-08-18 | Disposition: A | Discharge status: Home / Self Care | Payer: BC Managed Care – PPO | Source: Ambulatory Visit | Attending: Radiation Oncology | Admitting: Radiation Oncology

## 2018-08-18 DIAGNOSIS — C61 Malignant neoplasm of prostate: Secondary | ICD-10-CM | POA: Diagnosis not present

## 2018-08-22 ENCOUNTER — Ambulatory Visit
Admission: RE | Admit: 2018-08-22 | Discharge: 2018-08-22 | Disposition: A | Discharge status: Home / Self Care | Payer: BC Managed Care – PPO | Source: Ambulatory Visit | Attending: Radiation Oncology | Admitting: Radiation Oncology

## 2018-08-22 DIAGNOSIS — Z510 Encounter for antineoplastic radiation therapy: Secondary | ICD-10-CM | POA: Diagnosis present

## 2018-08-22 DIAGNOSIS — C61 Malignant neoplasm of prostate: Secondary | ICD-10-CM | POA: Insufficient documentation

## 2018-08-23 ENCOUNTER — Ambulatory Visit
Admission: RE | Admit: 2018-08-23 | Discharge: 2018-08-23 | Disposition: A | Discharge status: Home / Self Care | Payer: BC Managed Care – PPO | Source: Ambulatory Visit | Attending: Radiation Oncology | Admitting: Radiation Oncology

## 2018-08-23 DIAGNOSIS — C61 Malignant neoplasm of prostate: Secondary | ICD-10-CM | POA: Diagnosis not present

## 2018-08-24 ENCOUNTER — Ambulatory Visit
Admission: RE | Admit: 2018-08-24 | Discharge: 2018-08-24 | Disposition: A | Discharge status: Home / Self Care | Payer: BC Managed Care – PPO | Source: Ambulatory Visit | Attending: Radiation Oncology | Admitting: Radiation Oncology

## 2018-08-24 DIAGNOSIS — C61 Malignant neoplasm of prostate: Secondary | ICD-10-CM | POA: Diagnosis not present

## 2018-08-25 ENCOUNTER — Ambulatory Visit
Admission: RE | Admit: 2018-08-25 | Discharge: 2018-08-25 | Disposition: A | Discharge status: Home / Self Care | Payer: BC Managed Care – PPO | Source: Ambulatory Visit | Attending: Radiation Oncology | Admitting: Radiation Oncology

## 2018-08-25 DIAGNOSIS — C61 Malignant neoplasm of prostate: Secondary | ICD-10-CM | POA: Diagnosis not present

## 2018-08-28 ENCOUNTER — Ambulatory Visit
Admission: RE | Admit: 2018-08-28 | Discharge: 2018-08-28 | Disposition: A | Discharge status: Home / Self Care | Payer: BC Managed Care – PPO | Source: Ambulatory Visit | Attending: Radiation Oncology | Admitting: Radiation Oncology

## 2018-08-28 DIAGNOSIS — C61 Malignant neoplasm of prostate: Secondary | ICD-10-CM | POA: Diagnosis not present

## 2018-08-29 ENCOUNTER — Ambulatory Visit
Admission: RE | Admit: 2018-08-29 | Discharge: 2018-08-29 | Disposition: A | Discharge status: Home / Self Care | Payer: BC Managed Care – PPO | Source: Ambulatory Visit | Attending: Radiation Oncology | Admitting: Radiation Oncology

## 2018-08-29 DIAGNOSIS — C61 Malignant neoplasm of prostate: Secondary | ICD-10-CM | POA: Diagnosis not present

## 2018-08-30 ENCOUNTER — Ambulatory Visit
Admission: RE | Admit: 2018-08-30 | Discharge: 2018-08-30 | Disposition: A | Discharge status: Home / Self Care | Payer: BC Managed Care – PPO | Source: Ambulatory Visit | Attending: Radiation Oncology | Admitting: Radiation Oncology

## 2018-08-30 DIAGNOSIS — C61 Malignant neoplasm of prostate: Secondary | ICD-10-CM | POA: Diagnosis not present

## 2018-08-31 ENCOUNTER — Ambulatory Visit
Admission: RE | Admit: 2018-08-31 | Discharge: 2018-08-31 | Disposition: A | Discharge status: Home / Self Care | Payer: BC Managed Care – PPO | Source: Ambulatory Visit | Attending: Radiation Oncology | Admitting: Radiation Oncology

## 2018-08-31 DIAGNOSIS — C61 Malignant neoplasm of prostate: Secondary | ICD-10-CM | POA: Diagnosis not present

## 2018-09-01 ENCOUNTER — Ambulatory Visit
Admission: RE | Admit: 2018-09-01 | Discharge: 2018-09-01 | Disposition: A | Discharge status: Home / Self Care | Payer: BC Managed Care – PPO | Source: Ambulatory Visit | Attending: Radiation Oncology | Admitting: Radiation Oncology

## 2018-09-01 DIAGNOSIS — C61 Malignant neoplasm of prostate: Secondary | ICD-10-CM | POA: Diagnosis not present

## 2018-09-04 ENCOUNTER — Ambulatory Visit
Admission: RE | Admit: 2018-09-04 | Discharge: 2018-09-04 | Disposition: A | Discharge status: Home / Self Care | Payer: BC Managed Care – PPO | Source: Ambulatory Visit | Attending: Radiation Oncology | Admitting: Radiation Oncology

## 2018-09-04 DIAGNOSIS — C61 Malignant neoplasm of prostate: Secondary | ICD-10-CM | POA: Diagnosis not present

## 2018-09-05 ENCOUNTER — Ambulatory Visit
Admission: RE | Admit: 2018-09-05 | Discharge: 2018-09-05 | Disposition: A | Discharge status: Home / Self Care | Payer: BC Managed Care – PPO | Source: Ambulatory Visit | Attending: Radiation Oncology | Admitting: Radiation Oncology

## 2018-09-05 DIAGNOSIS — C61 Malignant neoplasm of prostate: Secondary | ICD-10-CM | POA: Diagnosis not present

## 2018-09-06 ENCOUNTER — Ambulatory Visit
Admission: RE | Admit: 2018-09-06 | Discharge: 2018-09-06 | Disposition: A | Discharge status: Home / Self Care | Payer: BC Managed Care – PPO | Source: Ambulatory Visit | Attending: Radiation Oncology | Admitting: Radiation Oncology

## 2018-09-06 DIAGNOSIS — C61 Malignant neoplasm of prostate: Secondary | ICD-10-CM | POA: Diagnosis not present

## 2018-09-07 ENCOUNTER — Ambulatory Visit
Admission: RE | Admit: 2018-09-07 | Discharge: 2018-09-07 | Disposition: A | Discharge status: Home / Self Care | Payer: BC Managed Care – PPO | Source: Ambulatory Visit | Attending: Radiation Oncology | Admitting: Radiation Oncology

## 2018-09-07 DIAGNOSIS — C61 Malignant neoplasm of prostate: Secondary | ICD-10-CM | POA: Diagnosis not present

## 2018-09-08 ENCOUNTER — Ambulatory Visit
Admission: RE | Admit: 2018-09-08 | Discharge: 2018-09-08 | Disposition: A | Discharge status: Home / Self Care | Payer: BC Managed Care – PPO | Source: Ambulatory Visit | Attending: Radiation Oncology | Admitting: Radiation Oncology

## 2018-09-08 DIAGNOSIS — C61 Malignant neoplasm of prostate: Secondary | ICD-10-CM | POA: Diagnosis not present

## 2018-09-11 ENCOUNTER — Ambulatory Visit
Admission: RE | Admit: 2018-09-11 | Discharge: 2018-09-11 | Disposition: A | Discharge status: Home / Self Care | Payer: BC Managed Care – PPO | Source: Ambulatory Visit | Attending: Radiation Oncology | Admitting: Radiation Oncology

## 2018-09-11 DIAGNOSIS — C61 Malignant neoplasm of prostate: Secondary | ICD-10-CM | POA: Diagnosis not present

## 2018-09-12 ENCOUNTER — Ambulatory Visit
Admission: RE | Admit: 2018-09-12 | Discharge: 2018-09-12 | Disposition: A | Discharge status: Home / Self Care | Payer: BC Managed Care – PPO | Source: Ambulatory Visit | Attending: Radiation Oncology | Admitting: Radiation Oncology

## 2018-09-12 DIAGNOSIS — C61 Malignant neoplasm of prostate: Secondary | ICD-10-CM | POA: Diagnosis not present

## 2018-09-13 ENCOUNTER — Ambulatory Visit
Admission: RE | Admit: 2018-09-13 | Discharge: 2018-09-13 | Disposition: A | Discharge status: Home / Self Care | Payer: BC Managed Care – PPO | Source: Ambulatory Visit | Attending: Radiation Oncology | Admitting: Radiation Oncology

## 2018-09-13 DIAGNOSIS — C61 Malignant neoplasm of prostate: Secondary | ICD-10-CM | POA: Diagnosis not present

## 2018-09-14 ENCOUNTER — Ambulatory Visit
Admission: RE | Admit: 2018-09-14 | Discharge: 2018-09-14 | Disposition: A | Discharge status: Home / Self Care | Payer: BC Managed Care – PPO | Source: Ambulatory Visit | Attending: Radiation Oncology | Admitting: Radiation Oncology

## 2018-09-14 DIAGNOSIS — C61 Malignant neoplasm of prostate: Secondary | ICD-10-CM | POA: Diagnosis not present

## 2018-09-15 ENCOUNTER — Encounter: Payer: Self-pay | Admitting: Internal Medicine

## 2018-09-15 ENCOUNTER — Ambulatory Visit
Admission: RE | Admit: 2018-09-15 | Discharge: 2018-09-15 | Disposition: A | Discharge status: Home / Self Care | Payer: BC Managed Care – PPO | Source: Ambulatory Visit | Attending: Radiation Oncology | Admitting: Radiation Oncology

## 2018-09-15 DIAGNOSIS — C61 Malignant neoplasm of prostate: Secondary | ICD-10-CM | POA: Diagnosis not present

## 2018-09-18 ENCOUNTER — Encounter: Payer: Self-pay | Admitting: Internal Medicine

## 2018-09-18 ENCOUNTER — Ambulatory Visit
Admission: RE | Admit: 2018-09-18 | Discharge: 2018-09-18 | Disposition: A | Discharge status: Home / Self Care | Payer: BC Managed Care – PPO | Source: Ambulatory Visit | Attending: Radiation Oncology | Admitting: Radiation Oncology

## 2018-09-18 DIAGNOSIS — C61 Malignant neoplasm of prostate: Secondary | ICD-10-CM | POA: Diagnosis not present

## 2018-09-19 ENCOUNTER — Ambulatory Visit
Admission: RE | Admit: 2018-09-19 | Discharge: 2018-09-19 | Disposition: A | Discharge status: Home / Self Care | Payer: BC Managed Care – PPO | Source: Ambulatory Visit | Attending: Radiation Oncology | Admitting: Radiation Oncology

## 2018-09-19 DIAGNOSIS — Z510 Encounter for antineoplastic radiation therapy: Secondary | ICD-10-CM | POA: Diagnosis present

## 2018-09-19 DIAGNOSIS — C61 Malignant neoplasm of prostate: Secondary | ICD-10-CM | POA: Diagnosis not present

## 2018-09-20 ENCOUNTER — Ambulatory Visit
Admission: RE | Admit: 2018-09-20 | Discharge: 2018-09-20 | Disposition: A | Discharge status: Home / Self Care | Payer: BC Managed Care – PPO | Source: Ambulatory Visit | Attending: Radiation Oncology | Admitting: Radiation Oncology

## 2018-09-20 DIAGNOSIS — C61 Malignant neoplasm of prostate: Secondary | ICD-10-CM | POA: Diagnosis not present

## 2018-09-21 ENCOUNTER — Ambulatory Visit
Admission: RE | Admit: 2018-09-21 | Discharge: 2018-09-21 | Disposition: A | Discharge status: Home / Self Care | Payer: BC Managed Care – PPO | Source: Ambulatory Visit | Attending: Radiation Oncology | Admitting: Radiation Oncology

## 2018-09-21 ENCOUNTER — Encounter: Payer: Self-pay | Admitting: Radiation Oncology

## 2018-09-21 DIAGNOSIS — C61 Malignant neoplasm of prostate: Secondary | ICD-10-CM | POA: Diagnosis not present

## 2018-09-28 NOTE — Progress Notes (Signed)
  Radiation Oncology         [phone]) [phone] ________________________________  Name: [name] Gross MRN: [id number]  Date: 09/21/2018  DOB: 03/12/1961  End of Treatment Note  Diagnosis:   57 y.o. male with rising, detectable PSA of 0.05 s/p prostatectomy for pT3aN0Mx, Gleason 3+4 adenocarcinoma of the prostate with pretreatment PSA of 5.57     Indication for treatment:  Curative, Salvage Prostatic Fossa Radiotherapy       Radiation treatment dates:   07/31/2018 - 09/21/2018  Site/dose:   The prostatic fossa was treated to 68.4 Gy in 38 fractions of 1.8 Gy  Beams/energy:   The prostatic fossa was treated using helical intensity modulated radiotherapy delivering 6 megavolt photons. Image guidance was performed with megavoltage CT studies prior to each fraction. He was immobilized with a body fix lower extremity mold.  Narrative: The patient tolerated radiation treatment relatively well.  He experienced mild fatigue and some minor urinary irritation with urgency and nocturia x1. He denied any urinary leakage or incontinence. He did not have any significant issues with his bowels other than an increase in frequency of bowel movements.  Plan: The patient has completed radiation treatment. He will return to radiation oncology clinic for routine followup in one month. I advised him to call or return sooner if he has any questions or concerns related to his recovery or treatment. ________________________________  Sheral Apley. Tammi Klippel, M.D.  This document serves as a record of services personally performed by Tyler Pita, MD. It was created on his behalf by Rae Lips, a trained medical scribe. The creation of this record is based on the scribe's personal observations and the provider's statements to them. This document has been checked and approved by the attending provider.

## 2018-09-30 ENCOUNTER — Other Ambulatory Visit: Payer: Self-pay | Admitting: Internal Medicine

## 2018-10-11 ENCOUNTER — Telehealth: Payer: Self-pay | Admitting: *Deleted

## 2018-10-11 NOTE — Telephone Encounter (Signed)
CALLED PATIENT TO ASK ABOUT RESCHEDULING FU ON 10-25-18 TO 10-26-18 PER PROVIDER REQUEST, PATIENT AGREED TO 10:30 AM ON 10-26-18

## 2018-10-25 ENCOUNTER — Ambulatory Visit: Payer: Self-pay | Admitting: Urology

## 2018-10-26 ENCOUNTER — Ambulatory Visit
Admission: RE | Admit: 2018-10-26 | Discharge: 2018-10-26 | Disposition: A | Discharge status: Home / Self Care | Payer: BC Managed Care – PPO | Source: Ambulatory Visit | Attending: Urology | Admitting: Urology

## 2018-10-26 ENCOUNTER — Encounter: Payer: Self-pay | Admitting: Urology

## 2018-10-26 ENCOUNTER — Other Ambulatory Visit: Payer: Self-pay

## 2018-10-26 VITALS — BP 129/89 | HR 78 | Temp 98.6°F | Resp 18 | Ht 65.0 in | Wt 179.2 lb

## 2018-10-26 DIAGNOSIS — Z7982 Long term (current) use of aspirin: Secondary | ICD-10-CM | POA: Insufficient documentation

## 2018-10-26 DIAGNOSIS — Z923 Personal history of irradiation: Secondary | ICD-10-CM | POA: Diagnosis not present

## 2018-10-26 DIAGNOSIS — Z7984 Long term (current) use of oral hypoglycemic drugs: Secondary | ICD-10-CM | POA: Insufficient documentation

## 2018-10-26 DIAGNOSIS — C61 Malignant neoplasm of prostate: Secondary | ICD-10-CM | POA: Diagnosis not present

## 2018-10-26 DIAGNOSIS — Z880 Allergy status to penicillin: Secondary | ICD-10-CM | POA: Insufficient documentation

## 2018-10-26 DIAGNOSIS — Z79899 Other long term (current) drug therapy: Secondary | ICD-10-CM | POA: Diagnosis not present

## 2018-10-26 NOTE — Progress Notes (Signed)
Radiation Oncology         [phone] ________________________________  Name: [name] Gross MRN: [id number]  Date: 10/26/2018  DOB: [birth year]  Post Treatment Note  CC: Biagio Borg, MD  Irine Seal, MD  Diagnosis:   57 y.o. male with rising, detectable PSA of 0.05 s/p prostatectomy for pT3aN0Mx, Gleason 3+4 adenocarcinoma of the prostate with pretreatment PSA of 5.57     Interval Since Last Radiation:  4 weeks  07/31/2018 - 09/21/2018:   The prostatic fossa was treated to 68.4 Gy in 38 fractions of 1.8 Gy  Narrative:  The patient returns today for routine follow-up.  He tolerated radiation treatment relatively well.  He experienced mild fatigue and some minor urinary irritation with urgency and nocturia x1. He denied any urinary leakage or incontinence. He did not have any significant issues with his bowels other than an increase in frequency of bowel movements.                              On review of systems, the patient states that he is doing very well overall.  He feels that his bowel and bladder function have returned to his baseline at this point.  His current IPSS is 3 with only mildly increased frequency and nocturia x1.  He has mild residual fatigue which is gradually improving.  He denies abdominal pain, nausea, vomiting, diarrhea or constipation.  He reports a healthy appetite and is maintaining his weight.  At this point, he is quite pleased with his progress to date.  He has a scheduled follow-up visit with Dr. Jeffie Pollock on December 21, 2018.  ALLERGIES:  is allergic to amoxicillin.  Meds: Current Outpatient Medications  Medication Sig Dispense Refill  . aspirin (ASPIRIN ADULT LOW STRENGTH) 81 MG EC tablet Take 1 tablet (81 mg total) by mouth daily, swallow whole. Please keep upcoming appt for future refills. Thank you 90 tablet 0  . clobetasol cream (TEMOVATE) 6.38 % APPLY 1 APPLICATION TOPICALLY 2 TIMES DAILY. 30 g 5  . escitalopram (LEXAPRO) 10 MG tablet Take 1 tablet (10 mg  total) by mouth daily. 90 tablet 2  . Magnesium 500 MG CAPS Take 1 capsule by mouth daily.     . metFORMIN (GLUCOPHAGE-XR) 500 MG 24 hr tablet TAKE 1 TABLET BY MOUTH EVERY DAY WITH BREAKFAST 90 tablet 1  . rosuvastatin (CRESTOR) 5 MG tablet Take 1 tablet (5 mg total) by mouth daily. Please upcoming appt with Dr. Burt Knack in January before anymore refills. Thank you 30 tablet 11  . traMADol (ULTRAM) 50 MG tablet TAKE 1 TABLET BY MOUTH TWICE A DAY AS NEEDED FOR PAIN 60 tablet 2  . nitroGLYCERIN (NITROSTAT) 0.4 MG SL tablet Place 1 tablet (0.4 mg total) under the tongue every 5 (five) minutes as needed for chest pain. (Patient not taking: Reported on 07/19/2018) 25 tablet 3   No current facility-administered medications for this encounter.     Physical Findings:  height is 5\' 5"  (1.651 m) and weight is 179 lb 3.2 oz (81.3 kg). His oral temperature is 98.6 F (37 C). His blood pressure is 129/89 and his pulse is 78. His respiration is 18 and oxygen saturation is 95%.  Pain Assessment Pain Score: 0-No pain/10 In general this is a well appearing Caucasian male in no acute distress. He's alert and oriented x4 and appropriate throughout the examination. Cardiopulmonary assessment is negative for acute distress and he exhibits  normal effort.   Lab Findings: Lab Results  Component Value Date   WBC 7.9 10/17/2017   HGB 15.7 10/17/2017   HCT 46.4 10/17/2017   MCV 88.3 10/17/2017   PLT 302.0 10/17/2017     Radiographic Findings: No results found.  Impression/Plan: 1. 57 y.o. male with rising, detectable PSA of 0.05 s/p prostatectomy for pT3aN0Mx, Gleason 3+4 adenocarcinoma of the prostate with pretreatment PSA of 5.57.   He will continue to follow up with urology for ongoing PSA determinations and has an appointment scheduled with Dr. Jeffie Pollock on December 21, 2018. He understands what to expect with regards to PSA monitoring going forward. I will look forward to following his response to treatment via  correspondence with urology, and would be happy to continue to participate in his care if clinically indicated. I talked to the patient about what to expect in the future, including his risk for erectile dysfunction and rectal bleeding. I encouraged him to call or return to the office if he has any questions regarding his previous radiation or possible radiation side effects. He was comfortable with this plan and will follow up as needed.    Nicholos Johns, PA-C

## 2018-10-26 NOTE — Addendum Note (Signed)
Encounter addended by: Malena Edman, RN on: 10/26/2018 1:27 PM  Actions taken: Charge Capture section accepted

## 2018-10-31 ENCOUNTER — Ambulatory Visit (INDEPENDENT_AMBULATORY_CARE_PROVIDER_SITE_OTHER): Payer: BC Managed Care – PPO | Admitting: Internal Medicine

## 2018-10-31 ENCOUNTER — Other Ambulatory Visit (INDEPENDENT_AMBULATORY_CARE_PROVIDER_SITE_OTHER): Payer: BC Managed Care – PPO

## 2018-10-31 ENCOUNTER — Encounter: Payer: Self-pay | Admitting: Internal Medicine

## 2018-10-31 DIAGNOSIS — E119 Type 2 diabetes mellitus without complications: Secondary | ICD-10-CM | POA: Diagnosis not present

## 2018-10-31 DIAGNOSIS — G4733 Obstructive sleep apnea (adult) (pediatric): Secondary | ICD-10-CM | POA: Diagnosis not present

## 2018-10-31 DIAGNOSIS — Z0000 Encounter for general adult medical examination without abnormal findings: Secondary | ICD-10-CM | POA: Diagnosis not present

## 2018-10-31 DIAGNOSIS — Z8546 Personal history of malignant neoplasm of prostate: Secondary | ICD-10-CM

## 2018-10-31 LAB — CBC WITH DIFFERENTIAL/PLATELET
BASOS ABS: 0.1 10*3/uL (ref 0.0–0.1)
BASOS PCT: 1.2 % (ref 0.0–3.0)
EOS ABS: 0.7 10*3/uL (ref 0.0–0.7)
Eosinophils Relative: 10.3 % — ABNORMAL HIGH (ref 0.0–5.0)
HEMATOCRIT: 43.4 % (ref 39.0–52.0)
Hemoglobin: 14.9 g/dL (ref 13.0–17.0)
LYMPHS PCT: 14.3 % (ref 12.0–46.0)
Lymphs Abs: 0.9 10*3/uL (ref 0.7–4.0)
MCHC: 34.2 g/dL (ref 30.0–36.0)
MCV: 88.6 fl (ref 78.0–100.0)
Monocytes Absolute: 0.6 10*3/uL (ref 0.1–1.0)
Monocytes Relative: 9.3 % (ref 3.0–12.0)
Neutro Abs: 4.1 10*3/uL (ref 1.4–7.7)
Neutrophils Relative %: 64.9 % (ref 43.0–77.0)
PLATELETS: 272 10*3/uL (ref 150.0–400.0)
RBC: 4.9 Mil/uL (ref 4.22–5.81)
RDW: 13.7 % (ref 11.5–15.5)
WBC: 6.3 10*3/uL (ref 4.0–10.5)

## 2018-10-31 LAB — BASIC METABOLIC PANEL
BUN: 20 mg/dL (ref 6–23)
CHLORIDE: 102 meq/L (ref 96–112)
CO2: 28 meq/L (ref 19–32)
Calcium: 9.6 mg/dL (ref 8.4–10.5)
Creatinine, Ser: 1.34 mg/dL (ref 0.40–1.50)
GFR: 58.26 mL/min — ABNORMAL LOW (ref >60.00)
Glucose, Bld: 105 mg/dL — ABNORMAL HIGH (ref 70–99)
POTASSIUM: 3.9 meq/L (ref 3.5–5.1)
Sodium: 140 mEq/L (ref 135–145)

## 2018-10-31 LAB — URINALYSIS, ROUTINE W REFLEX MICROSCOPIC
BILIRUBIN URINE: NEGATIVE
HGB URINE DIPSTICK: NEGATIVE
Ketones, ur: NEGATIVE
LEUKOCYTES UA: NEGATIVE
NITRITE: NEGATIVE
PH: 6.5 (ref 5.0–8.0)
RBC / HPF: NONE SEEN (ref 0–?)
Specific Gravity, Urine: 1.025 (ref 1.000–1.030)
TOTAL PROTEIN, URINE-UPE24: NEGATIVE
UROBILINOGEN UA: 0.2 (ref 0.0–1.0)
Urine Glucose: NEGATIVE

## 2018-10-31 LAB — LIPID PANEL
Cholesterol: 140 mg/dL (ref 0–200)
HDL: 47.2 mg/dL (ref >39.00)
NONHDL: 92.73
TRIGLYCERIDES: 285 mg/dL — AB (ref 0.0–149.0)
Total CHOL/HDL Ratio: 3
VLDL: 57 mg/dL — AB (ref 0.0–40.0)

## 2018-10-31 LAB — HEPATIC FUNCTION PANEL
ALT: 33 U/L (ref 0–53)
AST: 24 U/L (ref 0–37)
Albumin: 4.6 g/dL (ref 3.5–5.2)
Alkaline Phosphatase: 66 U/L (ref 39–117)
BILIRUBIN DIRECT: 0.1 mg/dL (ref 0.0–0.3)
TOTAL PROTEIN: 6.9 g/dL (ref 6.0–8.3)
Total Bilirubin: 0.6 mg/dL (ref 0.2–1.2)

## 2018-10-31 LAB — MICROALBUMIN / CREATININE URINE RATIO
Creatinine,U: 163.6 mg/dL
MICROALB/CREAT RATIO: 0.8 mg/g (ref 0.0–30.0)
Microalb, Ur: 1.2 mg/dL (ref 0.0–1.9)

## 2018-10-31 LAB — LDL CHOLESTEROL, DIRECT: LDL DIRECT: 63 mg/dL

## 2018-10-31 LAB — HEMOGLOBIN A1C: Hgb A1c MFr Bld: 7.1 % — ABNORMAL HIGH (ref 4.6–6.5)

## 2018-10-31 LAB — TSH: TSH: 3.7 u[IU]/mL (ref 0.35–4.50)

## 2018-10-31 NOTE — Progress Notes (Signed)
Subjective:    Patient ID: [name] Gross, male    DOB: November 20, 1961, 57 y.o.   MRN: [id number]  HPI    Here for wellness and f/u;  Overall doing ok;  Pt denies Chest pain, worsening SOB, DOE, wheezing, orthopnea, PND, worsening LE edema, palpitations, dizziness or syncope.  Pt denies neurological change such as new headache, facial or extremity weakness.  Pt denies polydipsia, polyuria, or low sugar symptoms. Pt states overall good compliance with treatment and medications, good tolerability, and has been trying to follow appropriate diet.  Pt denies worsening depressive symptoms, suicidal ideation or panic. No fever, night sweats, wt loss, loss of appetite, or other constitutional symptoms.  Pt states good ability with ADL's, has low fall risk, home safety reviewed and adequate, no other significant changes in hearing or vision, and only occasionally active with exercise. S/p XRT x 38 tx about 4 wks ago for prostate ca likely recurrence.  Does c/o ongoing fatigue, and sleepy most afternoons. Has been tested for OSA but he did not go back for tx, declines f/u for osa at this time. Past Medical History:  Diagnosis Date  . Allergic rhinitis   . Anxiety   . Asthma    mild intermittent  . CAD (coronary artery disease)    s/p INF STEMI 12/04/2010: treated with BMS to RCA c/b acute stent thrombosis at completion of  primary PCI ...above tx with second BMS with adjunctive thrombectomy residual CAD at cath 12/04/2010: pRCA 40-50%; LAD and CFX ok; EF 45% Echo 12/06/2010: Ef 55-65%; ?HK inferior wall; mild MR  . Erectile dysfunction   . GERD (gastroesophageal reflux disease)   . Glucose intolerance (impaired glucose tolerance)   . HTN (hypertension)   . Obesity   . OSA (obstructive sleep apnea)    mild, no CPAP per pt preference  . Osteoarthritis   . Prostate cancer [facility])    Past Surgical History:  Procedure Laterality Date  . CORONARY STENT PLACEMENT     Stenting of the right coronary  artery......... Successful percutaneous coronary intervention with  adjunctive thrombectomy for treatment of acute stent thrombosis and   reocclusion of the right coronary artery after treatment earlier today  of an acute myocardial infarction  . ELBOW SURGERY    . KNEE SURGERY    . NASAL SINUS SURGERY    . SPERMATOCELECTOMY    . WRIST SURGERY      reports that he quit smoking about 36 years ago. His smoking use included cigarettes. He has a 7.50 pack-year smoking history. He has never used smokeless tobacco. He reports that he does not drink alcohol or use drugs. family history includes Breast cancer in his maternal aunt; Cancer in his maternal aunt; Colon cancer in his maternal aunt; Coronary artery disease in his father; Diabetes in his father; Esophageal cancer in his mother; Heart disease in his father, maternal aunt, maternal grandfather, maternal grandmother, maternal uncle, and mother; Hip fracture in his father; Liver cancer in his maternal uncle; Prostate cancer in his brother. Allergies  Allergen Reactions  . Amoxicillin     Per the pt "it was years ago and does not remember the reaction"   Current Outpatient Medications on File Prior to Visit  Medication Sig Dispense Refill  . aspirin (ASPIRIN ADULT LOW STRENGTH) 81 MG EC tablet Take 1 tablet (81 mg total) by mouth daily, swallow whole. Please keep upcoming appt for future refills. Thank you 90 tablet 0  . clobetasol cream (TEMOVATE) 0.05 %  APPLY 1 APPLICATION TOPICALLY 2 TIMES DAILY. 30 g 5  . escitalopram (LEXAPRO) 10 MG tablet Take 1 tablet (10 mg total) by mouth daily. 90 tablet 2  . Magnesium 500 MG CAPS Take 1 capsule by mouth daily.     . metFORMIN (GLUCOPHAGE-XR) 500 MG 24 hr tablet TAKE 1 TABLET BY MOUTH EVERY DAY WITH BREAKFAST 90 tablet 1  . nitroGLYCERIN (NITROSTAT) 0.4 MG SL tablet Place 1 tablet (0.4 mg total) under the tongue every 5 (five) minutes as needed for chest pain. 25 tablet 3  . rosuvastatin (CRESTOR) 5 MG  tablet Take 1 tablet (5 mg total) by mouth daily. Please upcoming appt with Dr. Burt Knack in January before anymore refills. Thank you 30 tablet 11  . traMADol (ULTRAM) 50 MG tablet TAKE 1 TABLET BY MOUTH TWICE A DAY AS NEEDED FOR PAIN 60 tablet 2   No current facility-administered medications on file prior to visit.     Review of Systems Constitutional: Negative for other unusual diaphoresis, sweats, appetite or weight changes HENT: Negative for other worsening hearing loss, ear pain, facial swelling, mouth sores or neck stiffness.   Eyes: Negative for other worsening pain, redness or other visual disturbance.  Respiratory: Negative for other stridor or swelling Cardiovascular: Negative for other palpitations or other chest pain  Gastrointestinal: Negative for worsening diarrhea or loose stools, blood in stool, distention or other pain Genitourinary: Negative for hematuria, flank pain or other change in urine volume.  Musculoskeletal: Negative for myalgias or other joint swelling.  Skin: Negative for other color change, or other wound or worsening drainage.  Neurological: Negative for other syncope or numbness. Hematological: Negative for other adenopathy or swelling Psychiatric/Behavioral: Negative for hallucinations, other worsening agitation, SI, self-injury, or new decreased concentration All other system neg per pt    Objective:   Physical Exam BP 122/78   Pulse 78   Temp 97.7 F (36.5 C) (Oral)   Ht 5\' 5"  (1.651 m)   Wt 180 lb (81.6 kg)   SpO2 96%   BMI 29.95 kg/m  VS noted, tired apeparing Constitutional: Pt is oriented to person, place, and time. Appears well-developed and well-nourished, in no significant distress and comfortable Head: Normocephalic and atraumatic  Eyes: Conjunctivae and EOM are normal. Pupils are equal, round, and reactive to light Right Ear: External ear normal without discharge Left Ear: External ear normal without discharge Nose: Nose without  discharge or deformity Mouth/Throat: Oropharynx is without other ulcerations and moist  Neck: Normal range of motion. Neck supple. No JVD present. No tracheal deviation present or significant neck LA or mass Cardiovascular: Normal rate, regular rhythm, normal heart sounds and intact distal pulses.   Pulmonary/Chest: WOB normal and breath sounds without rales or wheezing  Abdominal: Soft. Bowel sounds are normal. NT. No HSM  Musculoskeletal: Normal range of motion. Exhibits no edema Lymphadenopathy: Has no other cervical adenopathy.  Neurological: Pt is alert and oriented to person, place, and time. Pt has normal reflexes. No cranial nerve deficit. Motor grossly intact, Gait intact Skin: Skin is warm and dry. No rash noted or new ulcerations Psychiatric:  Has normal mood and affect. Behavior is normal without agitation No other exam findings Lab Results  Component Value Date   WBC 7.9 10/17/2017   HGB 15.7 10/17/2017   HCT 46.4 10/17/2017   PLT 302.0 10/17/2017   GLUCOSE 120 (H) 04/25/2018   CHOL 145 04/25/2018   TRIG 229.0 (H) 04/25/2018   HDL 45.70 04/25/2018   LDLDIRECT 54.0  04/25/2018   LDLCALC 84 10/04/2014   ALT 29 04/25/2018   AST 18 04/25/2018   NA 141 04/25/2018   K 4.0 04/25/2018   CL 104 04/25/2018   CREATININE 1.25 04/25/2018   BUN 17 04/25/2018   CO2 29 04/25/2018   TSH 2.38 10/17/2017   PSA 0.04 (L) 10/17/2017   INR 1.00 12/22/2010   HGBA1C 6.7 (H) 04/25/2018       Assessment & Plan:

## 2018-10-31 NOTE — Assessment & Plan Note (Signed)
With more symptoms, declines further f/u at this time

## 2018-10-31 NOTE — Assessment & Plan Note (Signed)
Has f/u appt with Dr Wrenn/urology in Jan 2020 with psa

## 2018-10-31 NOTE — Assessment & Plan Note (Signed)

## 2018-10-31 NOTE — Assessment & Plan Note (Signed)
stable overall by history and exam, recent data reviewed with pt, and pt to continue medical treatment as before,  to f/u any worsening symptoms or concerns  

## 2018-10-31 NOTE — Patient Instructions (Signed)
Please remember to call for your yearly eye doctor appt  You had the Pneumovax pneumonia shot today  Please continue all other medications as before, and refills have been done if requested.  Please have the pharmacy call with any other refills you may need.  Please continue your efforts at being more active, low cholesterol diet, and weight control.  You are otherwise up to date with prevention measures today.  Please keep your appointments with your specialists as you may have planned  Please go to the LAB in the Basement (turn left off the elevator) for the tests to be done today  You will be contacted by phone if any changes need to be made immediately.  Otherwise, you will receive a letter about your results with an explanation, but please check with MyChart first.  Please remember to sign up for MyChart if you have not done so, as this will be important to you in the future with finding out test results, communicating by private email, and scheduling acute appointments online when needed.  Please return in 6 months, or sooner if needed, with Lab testing done 3-5 days before

## 2018-11-27 ENCOUNTER — Other Ambulatory Visit: Payer: Self-pay | Admitting: Internal Medicine

## 2018-11-27 NOTE — Telephone Encounter (Signed)
Done erx 

## 2019-01-16 ENCOUNTER — Other Ambulatory Visit: Payer: Self-pay | Admitting: Cardiovascular Disease

## 2019-01-21 NOTE — Progress Notes (Signed)
Cardiology Office Note:    Date:  01/22/2019   ID:  [name] Gross, DOB 1961/06/03, MRN [id number]  PCP:  [name] Borg, MD  Cardiologist:  [name] Mocha, MD  Electrophysiologist:  None   Referring MD: [name] Borg, MD   Chief Complaint  Patient presents with  . Coronary Artery Disease    History of Present Illness:    [name] Gross is a 58 y.o. male with a hx of CAD, presenting for follow-up evaluation. He initially presented with acute STEMI in 2011 when he had an  inferior wall STEMI treated with primary PCI using bare metal stenting.  The patient had acute stent thrombosis and a second stent was placed.  LVEF is been preserved at 55-65%.  He was initially diagnosed with prostate CA in 2008 and underwent prostatectomy at that time. His PSA began to rise and he has had to undergo radiation therapy over the past year (38 treatments).  He has been doing fine from a cardiac perspective.  He denies chest pain, chest pressure, shortness of breath, edema, or heart palpitations.  He is not been engaged in any regular exercise.  He does a fair amount of work with his hands and has had no exertional symptoms.  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  Past Medical History:  Diagnosis Date  . Allergic rhinitis   . Anxiety   . Asthma    mild intermittent  . CAD (coronary artery disease)    s/p INF STEMI 12/04/2010: treated with BMS to RCA c/b acute stent thrombosis at completion of  primary PCI ...above tx with second BMS with adjunctive thrombectomy residual CAD at cath 12/04/2010: pRCA 40-50%; LAD and CFX ok; EF 45% Echo 12/06/2010: Ef 55-65%; ?HK inferior wall; mild MR  . Erectile dysfunction   . GERD (gastroesophageal reflux disease)   . Glucose intolerance (impaired glucose tolerance)   . HTN (hypertension)   . Obesity   . OSA (obstructive sleep apnea)    mild, no CPAP  per pt preference  . Osteoarthritis   . Prostate cancer [facility])     Past Surgical History:  Procedure Laterality Date  . CORONARY STENT PLACEMENT     Stenting of the right coronary artery......... Successful percutaneous coronary intervention with  adjunctive thrombectomy for treatment of acute stent thrombosis and   reocclusion of the right coronary artery after treatment earlier today  of an acute myocardial infarction  . ELBOW SURGERY    . KNEE SURGERY    . NASAL SINUS SURGERY    . SPERMATOCELECTOMY    . WRIST SURGERY      Current Medications: Current Meds  Medication Sig  . aspirin (ASPIRIN ADULT LOW STRENGTH) 81 MG EC tablet Take 1 tablet (81 mg total) by mouth daily, swallow whole. Please keep upcoming appt for future refills. Thank you  . clobetasol cream (TEMOVATE) 9.37 % APPLY 1 APPLICATION TOPICALLY 2 TIMES DAILY.  Marland Kitchen escitalopram (LEXAPRO) 10 MG tablet Take 1 tablet (10 mg total) by mouth daily.  . Magnesium 500 MG CAPS Take 1 capsule by mouth daily.   . metFORMIN (GLUCOPHAGE-XR) 500 MG 24 hr tablet TAKE 1 TABLET BY MOUTH EVERY DAY WITH BREAKFAST  . nitroGLYCERIN (NITROSTAT) 0.4 MG SL tablet Place 1 tablet (0.4 mg total) under the tongue every 5 (five) minutes as needed for  chest pain.  . rosuvastatin (CRESTOR) 5 MG tablet Take 1 tablet (5 mg total) by mouth daily. Please keep upcoming appt in February with Dr. Burt Knack for future refills. Thank you  . traMADol (ULTRAM) 50 MG tablet TAKE 1 TABLET BY MOUTH TWICE A DAY AS NEEDED FOR PAIN     Allergies:   Amoxicillin   Social History   Socioeconomic History  . Marital status: Married    Spouse name: Not on file  . Number of children: 2  . Years of education: Not on file  . Highest education level: Not on file  Occupational History    Comment: full time  Social Needs  . Financial resource strain: Not on file  . Food insecurity:    Worry: Not on file    Inability: Not on file  . Transportation needs:    Medical: Not on  file    Non-medical: Not on file  Tobacco Use  . Smoking status: Former Smoker    Packs/day: 0.50    Years: 15.00    Pack years: 7.50    Types: Cigarettes    Last attempt to quit: 10/20/1982    Years since quitting: 36.2  . Smokeless tobacco: Never Used  Substance and Sexual Activity  . Alcohol use: No  . Drug use: No  . Sexual activity: Yes  Lifestyle  . Physical activity:    Days per week: Not on file    Minutes per session: Not on file  . Stress: Not on file  Relationships  . Social connections:    Talks on phone: Not on file    Gets together: Not on file    Attends religious service: Not on file    Active member of club or organization: Not on file    Attends meetings of clubs or organizations: Not on file    Relationship status: Not on file  Other Topics Concern  . 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 Resides in [place] with wife. Has one son and one daughter.     Family History: The patient's family history includes Breast cancer in his maternal aunt; Cancer in his maternal aunt; Colon cancer in his maternal aunt; Coronary artery disease in his father; Diabetes in his father; Esophageal cancer in his mother; Heart disease in his father, maternal aunt, maternal grandfather, maternal grandmother, maternal uncle, and mother; Hip fracture in his father; Liver cancer in his maternal uncle; Prostate cancer in his brother. There is no history of Rectal cancer or Stomach cancer.  ROS:   Please see the history of present illness.    Positive for erectile dysfunction.  All other systems reviewed and are negative.  EKGs/Labs/Other Studies Reviewed:    EKG:  EKG is ordered today.  The ekg ordered today demonstrates NSR 74 bpm, age-indeterminate inferior infarct  Recent Labs: 10/31/2018: ALT 33; BUN 20; Creatinine, Ser 1.34; Hemoglobin 14.9; Platelets 272.0; Potassium 3.9; Sodium 140; TSH 3.70  Recent Lipid Panel    Component Value Date/Time     CHOL 140 10/31/2018 0930   TRIG 285.0 (H) 10/31/2018 0930   TRIG 241 (HH) 10/31/2006 0917   HDL 47.20 10/31/2018 0930   CHOLHDL 3 10/31/2018 0930   VLDL 57.0 (H) 10/31/2018 0930   LDLCALC 84 10/04/2014 0828   LDLDIRECT 63.0 10/31/2018 0930    Physical Exam:    VS:  BP 118/86   Pulse 74   Ht 5\' 5"  (1.651 m)   Wt 178  lb 12.8 oz (81.1 kg)   SpO2 97%   BMI 29.75 kg/m     Wt Readings from Last 3 Encounters:  01/22/19 178 lb 12.8 oz (81.1 kg)  10/31/18 180 lb (81.6 kg)  10/26/18 179 lb 3.2 oz (81.3 kg)     GEN:  Well nourished, well developed in no acute distress HEENT: Normal NECK: No JVD; No carotid bruits LYMPHATICS: No lymphadenopathy CARDIAC: RRR, no murmurs, rubs, gallops RESPIRATORY:  Clear to auscultation without rales, wheezing or rhonchi  ABDOMEN: Soft, non-tender, non-distended MUSCULOSKELETAL:  No edema; No deformity  SKIN: Warm and dry NEUROLOGIC:  Alert and oriented x 3 PSYCHIATRIC:  Normal affect   ASSESSMENT:    1. Coronary artery disease involving native coronary artery of native heart without angina pectoris   2. Mixed hyperlipidemia   3. Erectile dysfunction following radiation therapy    PLAN:    In order of problems listed above:  1. The patient is stable without symptoms of angina.  He will continue on low-dose aspirin and a statin drug. 2. He is treated with rosuvastatin at low dose.  This is limited by tolerance.  We discussed his hypertriglyceridemia.  We discussed strategies at exercise and diet. 3. Viagra prescription written.  Precautions related to his coronary artery disease discussed.   Medication Adjustments/Labs and Tests Ordered: Current medicines are reviewed at length with the patient today.  Concerns regarding medicines are outlined above.  Orders Placed This Encounter  Procedures  . EKG 12-Lead   Meds ordered this encounter  Medications  . sildenafil (VIAGRA) 50 MG tablet    Sig: Take 1 tablet (50 mg total) by mouth  daily as needed for erectile dysfunction.    Dispense:  10 tablet    Refill:  1    Patient Instructions  Medication Instructions:  1) you have been given a prescription for Sildenafil 50 mg to take once daily as needed prior to sexual activity. Do not take within 24 hours of taking Nitroglycerin.  Labwork: None  Testing/Procedures: None  Follow-Up: Your provider wants you to follow-up in: 1 year with Dr. Burt Knack. You will receive a reminder letter in the mail two months in advance. If you don't receive a letter, please call our office to schedule the follow-up appointment.        Signed, [name] Mocha, MD  01/22/2019 8:40 AM    [place]

## 2019-01-22 ENCOUNTER — Ambulatory Visit: Payer: BC Managed Care – PPO | Admitting: Cardiovascular Disease

## 2019-01-22 ENCOUNTER — Encounter: Payer: Self-pay | Admitting: Cardiovascular Disease

## 2019-01-22 VITALS — BP 118/86 | HR 74 | Ht 65.0 in | Wt 178.8 lb

## 2019-01-22 DIAGNOSIS — N5235 Erectile dysfunction following radiation therapy: Secondary | ICD-10-CM | POA: Diagnosis not present

## 2019-01-22 DIAGNOSIS — I2510 Atherosclerotic heart disease of native coronary artery without angina pectoris: Secondary | ICD-10-CM

## 2019-01-22 DIAGNOSIS — E782 Mixed hyperlipidemia: Secondary | ICD-10-CM

## 2019-01-22 MED ORDER — SILDENAFIL CITRATE 50 MG PO TABS: 50.0000 mg | ORAL_TABLET | Freq: Every day | ORAL | 1 refills | Status: DC | PRN

## 2019-01-22 NOTE — Patient Instructions (Addendum)
Medication Instructions:  1) you have been given a prescription for Sildenafil 50 mg to take once daily as needed prior to sexual activity. Do not take within 24 hours of taking Nitroglycerin.  Labwork: None  Testing/Procedures: None  Follow-Up: Your provider wants you to follow-up in: 1 year with Dr. Burt Knack. You will receive a reminder letter in the mail two months in advance. If you don't receive a letter, please call our office to schedule the follow-up appointment.

## 2019-02-05 ENCOUNTER — Encounter: Payer: Self-pay | Admitting: Internal Medicine

## 2019-02-06 ENCOUNTER — Encounter: Payer: Self-pay | Admitting: Internal Medicine

## 2019-02-06 ENCOUNTER — Ambulatory Visit: Payer: BC Managed Care – PPO | Admitting: Internal Medicine

## 2019-02-06 VITALS — BP 104/68 | HR 92 | Temp 98.4°F | Ht 65.0 in | Wt 182.0 lb

## 2019-02-06 DIAGNOSIS — R030 Elevated blood-pressure reading, without diagnosis of hypertension: Secondary | ICD-10-CM | POA: Diagnosis not present

## 2019-02-06 DIAGNOSIS — E119 Type 2 diabetes mellitus without complications: Secondary | ICD-10-CM

## 2019-02-06 DIAGNOSIS — Z0000 Encounter for general adult medical examination without abnormal findings: Secondary | ICD-10-CM

## 2019-02-06 DIAGNOSIS — J069 Acute upper respiratory infection, unspecified: Secondary | ICD-10-CM | POA: Diagnosis not present

## 2019-02-06 MED ORDER — AZITHROMYCIN 250 MG PO TABS: ORAL_TABLET | ORAL | 1 refills | Status: DC

## 2019-02-06 NOTE — Patient Instructions (Addendum)
Please take all new medication as prescribed - the antibiotic  Please continue all other medications as before, and refills have been done if requested.  Please have the pharmacy call with any other refills you may need.  Please continue your efforts at being more active, low cholesterol diet, and weight control.  Please keep your appointments with your specialists as you may have planned  Please return in 6 months, or sooner if needed, with Lab testing done 3-5 days before

## 2019-02-06 NOTE — Assessment & Plan Note (Signed)
stable overall by history and exam, recent data reviewed with pt, and pt to continue medical treatment as before,  to f/u any worsening symptoms or concerns  

## 2019-02-06 NOTE — Progress Notes (Signed)
Subjective:    Patient ID: [name] Gross, male    DOB: [birth year], 58 y.o.   MRN: [id number]  HPI   Here with 2-3 days acute onset fever, facial pain, pressure, headache, general weakness and malaise, and greenish d/c, with mild ST and cough, but pt denies chest pain, wheezing, increased sob or doe, orthopnea, PND, increased LE swelling, palpitations, dizziness or syncope.  Pt denies new neurological symptoms such as new headache, or facial or extremity weakness or numbness   Pt denies polydipsia, polyuria,  Saw Dr Jeffie Pollock 12/2018 with psa starting to come down, with f/u at 3-6 months, has worsening ED after the xrt tx.  Getting viagra prn from urology, ok with cardiology even though with prn ntg. Past Medical History:  Diagnosis Date  . Allergic rhinitis   . Anxiety   . Asthma    mild intermittent  . CAD (coronary artery disease)    s/p INF STEMI 12/04/2010: treated with BMS to RCA c/b acute stent thrombosis at completion of  primary PCI ...above tx with second BMS with adjunctive thrombectomy residual CAD at cath 12/04/2010: pRCA 40-50%; LAD and CFX ok; EF 45% Echo 12/06/2010: Ef 55-65%; ?HK inferior wall; mild MR  . Erectile dysfunction   . GERD (gastroesophageal reflux disease)   . Glucose intolerance (impaired glucose tolerance)   . HTN (hypertension)   . Obesity   . OSA (obstructive sleep apnea)    mild, no CPAP per pt preference  . Osteoarthritis   . Prostate cancer [facility])    Past Surgical History:  Procedure Laterality Date  . CORONARY STENT PLACEMENT     Stenting of the right coronary artery......... Successful percutaneous coronary intervention with  adjunctive thrombectomy for treatment of acute stent thrombosis and   reocclusion of the right coronary artery after treatment earlier today  of an acute myocardial infarction  . ELBOW SURGERY    . KNEE SURGERY    . NASAL SINUS SURGERY    . SPERMATOCELECTOMY    . WRIST SURGERY      reports that he quit smoking about 36 years ago.  His smoking use included cigarettes. He has a 7.50 pack-year smoking history. He has never used smokeless tobacco. He reports that he does not drink alcohol or use drugs. family history includes Breast cancer in his maternal aunt; Cancer in his maternal aunt; Colon cancer in his maternal aunt; Coronary artery disease in his father; Diabetes in his father; Esophageal cancer in his mother; Heart disease in his father, maternal aunt, maternal grandfather, maternal grandmother, maternal uncle, and mother; Hip fracture in his father; Liver cancer in his maternal uncle; Prostate cancer in his brother. Allergies  Allergen Reactions  . Amoxicillin     Per the pt "it was years ago and does not remember the reaction"   Current Outpatient Medications on File Prior to Visit  Medication Sig Dispense Refill  . aspirin (ASPIRIN ADULT LOW STRENGTH) 81 MG EC tablet Take 1 tablet (81 mg total) by mouth daily, swallow whole. Please keep upcoming appt for future refills. Thank you 90 tablet 0  . clobetasol cream (TEMOVATE) 4.76 % APPLY 1 APPLICATION TOPICALLY 2 TIMES DAILY. 30 g 5  . escitalopram (LEXAPRO) 10 MG tablet Take 1 tablet (10 mg total) by mouth daily. 90 tablet 2  . Magnesium 500 MG CAPS Take 1 capsule by mouth daily.     . metFORMIN (GLUCOPHAGE-XR) 500 MG 24 hr tablet TAKE 1 TABLET BY MOUTH EVERY DAY WITH BREAKFAST  90 tablet 1  . nitroGLYCERIN (NITROSTAT) 0.4 MG SL tablet Place 1 tablet (0.4 mg total) under the tongue every 5 (five) minutes as needed for chest pain. 25 tablet 3  . rosuvastatin (CRESTOR) 5 MG tablet Take 1 tablet (5 mg total) by mouth daily. Please keep upcoming appt in February with Dr. Burt Knack for future refills. Thank you 30 tablet 0  . sildenafil (VIAGRA) 50 MG tablet Take 1 tablet (50 mg total) by mouth daily as needed for erectile dysfunction. 10 tablet 1  . traMADol (ULTRAM) 50 MG tablet TAKE 1 TABLET BY MOUTH TWICE A DAY AS NEEDED FOR PAIN 60 tablet 2   No current  facility-administered medications on file prior to visit.     Review of Systems  Constitutional: Negative for other unusual diaphoresis or sweats HENT: Negative for ear discharge or swelling Eyes: Negative for other worsening visual disturbances Respiratory: Negative for stridor or other swelling  Gastrointestinal: Negative for worsening distension or other blood Genitourinary: Negative for retention or other urinary change Musculoskeletal: Negative for other MSK pain or swelling Skin: Negative for color change or other new lesions Neurological: Negative for worsening tremors and other numbness  Psychiatric/Behavioral: Negative for worsening agitation or other fatigue All other system neg per pt    Objective:   Physical Exam BP 104/68   Pulse 92   Temp 98.4 F (36.9 C) (Oral)   Ht 5\' 5"  (1.651 m)   Wt 182 lb (82.6 kg)   SpO2 94%   BMI 30.29 kg/m  VS noted, mild ill Constitutional: Pt appears in NAD HENT: Head: NCAT.  Right Ear: External ear normal.  Left Ear: External ear normal.  Eyes: . Pupils are equal, round, and reactive to light. Conjunctivae and EOM are normal Bilat tm's with mild erythema.  Max sinus areas mild tender.  Pharynx with mild erythema, no exudate Nose: without d/c or deformity Neck: Neck supple. Gross normal ROM Cardiovascular: Normal rate and regular rhythm.   Pulmonary/Chest: Effort normal and breath sounds without rales or wheezing.  Abd:  Soft, NT, ND, + BS, no organomegaly Neurological: Pt is alert. At baseline orientation, motor grossly intact Skin: Skin is warm. No rashes, other new lesions, no LE edema Psychiatric: Pt behavior is normal without agitation  No other exam findings Lab Results  Component Value Date   WBC 6.3 10/31/2018   HGB 14.9 10/31/2018   HCT 43.4 10/31/2018   PLT 272.0 10/31/2018   GLUCOSE 105 (H) 10/31/2018   CHOL 140 10/31/2018   TRIG 285.0 (H) 10/31/2018   HDL 47.20 10/31/2018   LDLDIRECT 63.0 10/31/2018   LDLCALC  84 10/04/2014   ALT 33 10/31/2018   AST 24 10/31/2018   NA 140 10/31/2018   K 3.9 10/31/2018   CL 102 10/31/2018   CREATININE 1.34 10/31/2018   BUN 20 10/31/2018   CO2 28 10/31/2018   TSH 3.70 10/31/2018   PSA 0.04 (L) 10/17/2017   INR 1.00 12/22/2010   HGBA1C 7.1 (H) 10/31/2018   MICROALBUR 1.2 10/31/2018        Assessment & Plan:

## 2019-02-06 NOTE — Assessment & Plan Note (Signed)
Mild to mod, for antibx course,  to f/u any worsening symptoms or concerns 

## 2019-02-06 NOTE — Assessment & Plan Note (Signed)
BP Readings from Last 3 Encounters:  02/06/19 104/68  01/22/19 118/86  10/31/18 122/78  .stable overall by history and exam, recent data reviewed with pt, and pt to continue medical treatment as before,  to f/u any worsening symptoms or concerns

## 2019-02-07 MED ORDER — SILDENAFIL CITRATE 20 MG PO TABS: ORAL_TABLET | ORAL | 1 refills | Status: DC

## 2019-02-09 ENCOUNTER — Other Ambulatory Visit: Payer: Self-pay | Admitting: Cardiovascular Disease

## 2019-03-02 ENCOUNTER — Other Ambulatory Visit: Payer: Self-pay | Admitting: Cardiovascular Disease

## 2019-03-30 ENCOUNTER — Other Ambulatory Visit: Payer: Self-pay | Admitting: Internal Medicine

## 2019-04-14 ENCOUNTER — Other Ambulatory Visit: Payer: Self-pay | Admitting: Internal Medicine

## 2019-05-01 ENCOUNTER — Ambulatory Visit: Payer: BC Managed Care – PPO | Admitting: Internal Medicine

## 2019-05-09 ENCOUNTER — Other Ambulatory Visit: Payer: Self-pay | Admitting: Internal Medicine

## 2019-05-09 NOTE — Telephone Encounter (Signed)
Done erx 

## 2019-06-04 ENCOUNTER — Ambulatory Visit: Payer: Self-pay | Admitting: Internal Medicine

## 2019-06-04 ENCOUNTER — Encounter: Payer: Self-pay | Admitting: Internal Medicine

## 2019-06-04 ENCOUNTER — Ambulatory Visit (INDEPENDENT_AMBULATORY_CARE_PROVIDER_SITE_OTHER): Payer: BC Managed Care – PPO | Admitting: Internal Medicine

## 2019-06-04 DIAGNOSIS — Z20828 Contact with and (suspected) exposure to other viral communicable diseases: Secondary | ICD-10-CM

## 2019-06-04 DIAGNOSIS — C61 Malignant neoplasm of prostate: Secondary | ICD-10-CM | POA: Diagnosis not present

## 2019-06-04 DIAGNOSIS — E119 Type 2 diabetes mellitus without complications: Secondary | ICD-10-CM | POA: Diagnosis not present

## 2019-06-04 DIAGNOSIS — F411 Generalized anxiety disorder: Secondary | ICD-10-CM

## 2019-06-04 DIAGNOSIS — Z20822 Contact with and (suspected) exposure to covid-19: Secondary | ICD-10-CM | POA: Insufficient documentation

## 2019-06-04 NOTE — Assessment & Plan Note (Signed)
stable overall by history and exam, recent data reviewed with pt, and pt to continue medical treatment as before,  to f/u any worsening symptoms or concerns  

## 2019-06-04 NOTE — Patient Instructions (Signed)
Please continue self isolation until the COVID 19 test results are known  Please continue all other medications as before, and refills have been done if requested.  Please have the pharmacy call with any other refills you may need.  Please continue your efforts at being more active, low cholesterol diet, and weight control.  Please keep your appointments with your specialists as you may have planned

## 2019-06-04 NOTE — Progress Notes (Signed)
Patient ID: [name] Gross, male   DOB: [birth year], 58 y.o.   MRN: [id number]  Virtual Visit via Video Note  I connected with [name] Gross on 06/04/19 at  7:00 PM EDT by a video enabled telemedicine application and verified that I am speaking with the correct person using two identifiers.  Location: Patient: at home Provider: ar office   I discussed the limitations of evaluation and management by telemedicine and the availability of in person appointments. The patient expressed understanding and agreed to proceed.  History of Present Illness: Pt here to discuss exposure to COVID 19; last Thursday bfast time he sat with 2 coworkers, only one of which had a cough at the time, and didn't think much of it.  Later both the coworkers developed worsening URI symptoms, and both tested positive at the local [facility].  Pt himself denies fever, HA, ST, cough, sinus or ear pain, neck pain, sob or wheezing or CP.   Wife with him has no symptoms as well.  Fortunately he himself was tested at the [facility] today and results should be available in 2-3 days.  He was wondering what to do with a positive result, and when to think about going to the hospital, how long to stay self isolated if positive among other questions.  Fortunately he is working from home so has no significant other exposure to other persons except the wife.  Has been working from home with rare outings since late mar 2020.   Pt denies polydipsia, polyuria.  Denies worsening depressive symptoms, suicidal ideation, or panic  Past Medical History:  Diagnosis Date  . Allergic rhinitis   . Anxiety   . Asthma    mild intermittent  . CAD (coronary artery disease)    s/p INF STEMI 12/04/2010: treated with BMS to RCA c/b acute stent thrombosis at completion of  primary PCI ...above tx with second BMS with adjunctive thrombectomy residual CAD at cath 12/04/2010: pRCA 40-50%; LAD and CFX ok; EF 45% Echo 12/06/2010: Ef 55-65%; ?HK inferior wall;  mild MR  . Erectile dysfunction   . GERD (gastroesophageal reflux disease)   . Glucose intolerance (impaired glucose tolerance)   . HTN (hypertension)   . Obesity   . OSA (obstructive sleep apnea)    mild, no CPAP per pt preference  . Osteoarthritis   . Prostate cancer [facility])    Past Surgical History:  Procedure Laterality Date  . CORONARY STENT PLACEMENT     Stenting of the right coronary artery......... Successful percutaneous coronary intervention with  adjunctive thrombectomy for treatment of acute stent thrombosis and   reocclusion of the right coronary artery after treatment earlier today  of an acute myocardial infarction  . ELBOW SURGERY    . KNEE SURGERY    . NASAL SINUS SURGERY    . SPERMATOCELECTOMY    . WRIST SURGERY      reports that he quit smoking about 36 years ago. His smoking use included cigarettes. He has a 7.50 pack-year smoking history. He has never used smokeless tobacco. He reports that he does not drink alcohol or use drugs. family history includes Breast cancer in his maternal aunt; Cancer in his maternal aunt; Colon cancer in his maternal aunt; Coronary artery disease in his father; Diabetes in his father; Esophageal cancer in his mother; Heart disease in his father, maternal aunt, maternal grandfather, maternal grandmother, maternal uncle, and mother; Hip fracture in his father; Liver cancer in his maternal uncle; Prostate cancer  in his brother. Allergies  Allergen Reactions  . Amoxicillin     Per the pt "it was years ago and does not remember the reaction"   Current Outpatient Medications on File Prior to Visit  Medication Sig Dispense Refill  . aspirin (ASPIRIN ADULT LOW STRENGTH) 81 MG EC tablet Take 1 tablet (81 mg total) by mouth daily. 90 tablet 3  . azithromycin (ZITHROMAX Z-PAK) 250 MG tablet 2 tab by mouth day 1, then 1 per day 6 tablet 1  . clobetasol cream (TEMOVATE) 6.41 % APPLY 1 APPLICATION TOPICALLY 2 TIMES DAILY. 30 g 5  . escitalopram  (LEXAPRO) 10 MG tablet TAKE 1 TABLET BY MOUTH EVERY DAY 90 tablet 1  . Magnesium 500 MG CAPS Take 1 capsule by mouth daily.     . metFORMIN (GLUCOPHAGE-XR) 500 MG 24 hr tablet TAKE 1 TABLET BY MOUTH EVERY DAY WITH BREAKFAST 90 tablet 1  . nitroGLYCERIN (NITROSTAT) 0.4 MG SL tablet Place 1 tablet (0.4 mg total) under the tongue every 5 (five) minutes as needed for chest pain. 25 tablet 3  . rosuvastatin (CRESTOR) 5 MG tablet Take 1 tablet (5 mg total) by mouth daily at 6 PM. 90 tablet 3  . sildenafil (REVATIO) 20 MG tablet Take 2-5 tablets once daily as needed prior to sexual activity. 20 tablet 1  . traMADol (ULTRAM) 50 MG tablet TAKE 1 TABLET BY MOUTH TWICE A DAY AS NEEDED FOR PAIN 60 tablet 2   No current facility-administered medications on file prior to visit.     Observations/Objective: Alert, NAD, appropriate mood and affect, resps normal, cn 2-12 intact, moves all 4s, no visible rash or swelling Lab Results  Component Value Date   WBC 6.3 10/31/2018   HGB 14.9 10/31/2018   HCT 43.4 10/31/2018   PLT 272.0 10/31/2018   GLUCOSE 105 (H) 10/31/2018   CHOL 140 10/31/2018   TRIG 285.0 (H) 10/31/2018   HDL 47.20 10/31/2018   LDLDIRECT 63.0 10/31/2018   LDLCALC 84 10/04/2014   ALT 33 10/31/2018   AST 24 10/31/2018   NA 140 10/31/2018   K 3.9 10/31/2018   CL 102 10/31/2018   CREATININE 1.34 10/31/2018   BUN 20 10/31/2018   CO2 28 10/31/2018   TSH 3.70 10/31/2018   PSA 0.04 (L) 10/17/2017   INR 1.00 12/22/2010   HGBA1C 7.1 (H) 10/31/2018   MICROALBUR 1.2 10/31/2018   Assessment and Plan: See notes  Follow Up Instructions: See notes   I discussed the assessment and treatment plan with the patient. The patient was provided an opportunity to ask questions and all were answered. The patient agreed with the plan and demonstrated an understanding of the instructions.   The patient was advised to call back or seek an in-person evaluation if the symptoms worsen or if the condition  fails to improve as anticipated   Cathlean Cower, MD

## 2019-06-04 NOTE — Telephone Encounter (Signed)
Pt. Reports he was with a friend last Thursday, who has tested positive for COVID 19. Warm transfer to Sam in the practice for a virtual visit.  Answer Assessment - Initial Assessment Questions 1. CLOSE CONTACT: "Who is the person with the confirmed or suspected COVID-19 infection that you were exposed to?"     Friend 2. PLACE of CONTACT: "Where were you when you were exposed to COVID-19?" (e.g., home, school, medical waiting room; which city?)     Restaurant 3. TYPE of CONTACT: "How much contact was there?" (e.g., sitting next to, live in same house, work in same office, same building)     Sitting 4. DURATION of CONTACT: "How long were you in contact with the COVID-19 patient?" (e.g., a few seconds, passed by person, a few minutes, live with the patient)     1 hour 5. DATE of CONTACT: "When did you have contact with a COVID-19 patient?" (e.g., how many days ago)     Last Thursday 6. TRAVEL: "Have you traveled out of the country recently?" If so, "When and where?"     * Also ask about out-of-state travel, since the CDC has identified some high-risk cities for community spread in the [place].     * Note: Travel becomes less relevant if there is widespread community transmission where the patient lives.     No 7. COMMUNITY SPREAD: "Are there lots of cases of COVID-19 (community spread) where you live?" (See public health department website, if unsure)       Yes 8. SYMPTOMS: "Do you have any symptoms?" (e.g., fever, cough, breathing difficulty)     No 9. PREGNANCY OR POSTPARTUM: "Is there any chance you are pregnant?" "When was your last menstrual period?" "Did you deliver in the last 2 weeks?"     N/A 10. HIGH RISK: "Do you have any heart or lung problems? Do you have a weak immune system?" (e.g., CHF, COPD, asthma, HIV positive, chemotherapy, renal failure, diabetes mellitus, sickle cell anemia)       Asthma, diabetes, Heart  Protocols used: CORONAVIRUS (COVID-19) EXPOSURE-A-AH

## 2019-06-04 NOTE — Assessment & Plan Note (Signed)
D/w pt natural hx, and clinical symptoms/signs of COVD19 infection, pt has testing pending likely to be known in 2-3 days; for contd self isolation until then, including wife

## 2019-08-07 ENCOUNTER — Other Ambulatory Visit (INDEPENDENT_AMBULATORY_CARE_PROVIDER_SITE_OTHER): Payer: BC Managed Care – PPO

## 2019-08-07 ENCOUNTER — Ambulatory Visit (INDEPENDENT_AMBULATORY_CARE_PROVIDER_SITE_OTHER): Payer: BC Managed Care – PPO | Admitting: Internal Medicine

## 2019-08-07 ENCOUNTER — Encounter: Payer: Self-pay | Admitting: Internal Medicine

## 2019-08-07 ENCOUNTER — Other Ambulatory Visit: Payer: Self-pay | Admitting: Internal Medicine

## 2019-08-07 ENCOUNTER — Other Ambulatory Visit: Payer: Self-pay

## 2019-08-07 VITALS — BP 116/78 | HR 86 | Temp 97.8°F | Ht 65.0 in | Wt 173.0 lb

## 2019-08-07 DIAGNOSIS — E611 Iron deficiency: Secondary | ICD-10-CM | POA: Diagnosis not present

## 2019-08-07 DIAGNOSIS — Z0000 Encounter for general adult medical examination without abnormal findings: Secondary | ICD-10-CM

## 2019-08-07 DIAGNOSIS — E119 Type 2 diabetes mellitus without complications: Secondary | ICD-10-CM | POA: Diagnosis not present

## 2019-08-07 DIAGNOSIS — E538 Deficiency of other specified B group vitamins: Secondary | ICD-10-CM

## 2019-08-07 DIAGNOSIS — E559 Vitamin D deficiency, unspecified: Secondary | ICD-10-CM | POA: Diagnosis not present

## 2019-08-07 LAB — URINALYSIS, ROUTINE W REFLEX MICROSCOPIC
Bilirubin Urine: NEGATIVE
Hgb urine dipstick: NEGATIVE
Ketones, ur: NEGATIVE
Leukocytes,Ua: NEGATIVE
Nitrite: NEGATIVE
RBC / HPF: NONE SEEN (ref 0–?)
Specific Gravity, Urine: 1.02 (ref 1.000–1.030)
Total Protein, Urine: NEGATIVE
Urine Glucose: >=1000 — AB
Urobilinogen, UA: 0.2 (ref 0.0–1.0)
WBC, UA: NONE SEEN (ref 0–?)
pH: 6 (ref 5.0–8.0)

## 2019-08-07 LAB — CBC WITH DIFFERENTIAL/PLATELET
Basophils Absolute: 0.1 10*3/uL (ref 0.0–0.1)
Basophils Relative: 2.3 % (ref 0.0–3.0)
Eosinophils Absolute: 0.5 10*3/uL (ref 0.0–0.7)
Eosinophils Relative: 7.7 % — ABNORMAL HIGH (ref 0.0–5.0)
HCT: 44.3 % (ref 39.0–52.0)
Hemoglobin: 14.8 g/dL (ref 13.0–17.0)
Lymphocytes Relative: 17.4 % (ref 12.0–46.0)
Lymphs Abs: 1.1 10*3/uL (ref 0.7–4.0)
MCHC: 33.4 g/dL (ref 30.0–36.0)
MCV: 87.2 fl (ref 78.0–100.0)
Monocytes Absolute: 0.4 10*3/uL (ref 0.1–1.0)
Monocytes Relative: 6.5 % (ref 3.0–12.0)
Neutro Abs: 4.1 10*3/uL (ref 1.4–7.7)
Neutrophils Relative %: 66.1 % (ref 43.0–77.0)
Platelets: 283 10*3/uL (ref 150.0–400.0)
RBC: 5.08 Mil/uL (ref 4.22–5.81)
RDW: 13 % (ref 11.5–15.5)
WBC: 6.2 10*3/uL (ref 4.0–10.5)

## 2019-08-07 LAB — LIPID PANEL
Cholesterol: 172 mg/dL (ref 0–200)
HDL: 54.3 mg/dL (ref >39.00)
NonHDL: 117.69
Total CHOL/HDL Ratio: 3
Triglycerides: 389 mg/dL — ABNORMAL HIGH (ref 0.0–149.0)
VLDL: 77.8 mg/dL — ABNORMAL HIGH (ref 0.0–40.0)

## 2019-08-07 LAB — BASIC METABOLIC PANEL
BUN: 16 mg/dL (ref 6–23)
CO2: 28 mEq/L (ref 19–32)
Calcium: 9.6 mg/dL (ref 8.4–10.5)
Chloride: 100 mEq/L (ref 96–112)
Creatinine, Ser: 1.25 mg/dL (ref 0.40–1.50)
GFR: 59.24 mL/min — ABNORMAL LOW (ref >60.00)
Glucose, Bld: 218 mg/dL — ABNORMAL HIGH (ref 70–99)
Potassium: 3.9 mEq/L (ref 3.5–5.1)
Sodium: 136 mEq/L (ref 135–145)

## 2019-08-07 LAB — HEPATIC FUNCTION PANEL
ALT: 38 U/L (ref 0–53)
AST: 24 U/L (ref 0–37)
Albumin: 4.6 g/dL (ref 3.5–5.2)
Alkaline Phosphatase: 71 U/L (ref 39–117)
Bilirubin, Direct: 0.1 mg/dL (ref 0.0–0.3)
Total Bilirubin: 0.4 mg/dL (ref 0.2–1.2)
Total Protein: 6.7 g/dL (ref 6.0–8.3)

## 2019-08-07 LAB — IBC PANEL
Iron: 102 ug/dL (ref 42–165)
Saturation Ratios: 27.9 % (ref 20.0–50.0)
Transferrin: 261 mg/dL (ref 212.0–360.0)

## 2019-08-07 LAB — LDL CHOLESTEROL, DIRECT: Direct LDL: 68 mg/dL

## 2019-08-07 LAB — MICROALBUMIN / CREATININE URINE RATIO
Creatinine,U: 105.3 mg/dL
Microalb Creat Ratio: 0.7 mg/g (ref 0.0–30.0)
Microalb, Ur: <0.7 mg/dL (ref 0.0–1.9)

## 2019-08-07 LAB — VITAMIN D 25 HYDROXY (VIT D DEFICIENCY, FRACTURES): VITD: 29.95 ng/mL — ABNORMAL LOW (ref 30.00–100.00)

## 2019-08-07 LAB — HEMOGLOBIN A1C: Hgb A1c MFr Bld: 7.3 % — ABNORMAL HIGH (ref 4.6–6.5)

## 2019-08-07 LAB — VITAMIN B12: Vitamin B-12: 342 pg/mL (ref 211–911)

## 2019-08-07 LAB — TSH: TSH: 2.25 u[IU]/mL (ref 0.35–4.50)

## 2019-08-07 MED ORDER — VITAMIN D (ERGOCALCIFEROL) 1.25 MG (50000 UNIT) PO CAPS: 50000.0000 [IU] | ORAL_CAPSULE | ORAL | 0 refills | Status: DC

## 2019-08-07 NOTE — Patient Instructions (Signed)

## 2019-08-07 NOTE — Assessment & Plan Note (Signed)
stable overall by history and exam, recent data reviewed with pt, and pt to continue medical treatment as before,  to f/u any worsening symptoms or concerns  

## 2019-08-07 NOTE — Progress Notes (Signed)
Subjective:    Patient ID: [name] Gross, male    DOB: August 12, 1961, 58 y.o.   MRN: [id number]  HPI  Here for wellness and f/u;  Overall doing ok;  Pt denies Chest pain, worsening SOB, DOE, wheezing, orthopnea, PND, worsening LE edema, palpitations, dizziness or syncope.  Pt denies neurological change such as new headache, facial or extremity weakness.  Pt denies polydipsia, polyuria, or low sugar symptoms. Pt states overall good compliance with treatment and medications, good tolerability, and has been trying to follow appropriate diet.  Pt denies worsening depressive symptoms, suicidal ideation or panic. No fever, night sweats, wt loss, loss of appetite, or other constitutional symptoms.  Pt states good ability with ADL's, has low fall risk, home safety reviewed and adequate, no other significant changes in hearing or vision, and only occasionally active with exercise. Plans to f/u optho on his own.  CBGs in am 120's. Now s/p XRT for prostate ca 2019, but psa recently on the increase again, to f/u in 6 mo Past Medical History:  Diagnosis Date  . Allergic rhinitis   . Anxiety   . Asthma    mild intermittent  . CAD (coronary artery disease)    s/p INF STEMI 12/04/2010: treated with BMS to RCA c/b acute stent thrombosis at completion of  primary PCI ...above tx with second BMS with adjunctive thrombectomy residual CAD at cath 12/04/2010: pRCA 40-50%; LAD and CFX ok; EF 45% Echo 12/06/2010: Ef 55-65%; ?HK inferior wall; mild MR  . Erectile dysfunction   . GERD (gastroesophageal reflux disease)   . Glucose intolerance (impaired glucose tolerance)   . HTN (hypertension)   . Obesity   . OSA (obstructive sleep apnea)    mild, no CPAP per pt preference  . Osteoarthritis   . Prostate cancer [facility])    Past Surgical History:  Procedure Laterality Date  . CORONARY STENT PLACEMENT     Stenting of the right coronary artery......... Successful percutaneous coronary intervention with  adjunctive  thrombectomy for treatment of acute stent thrombosis and   reocclusion of the right coronary artery after treatment earlier today  of an acute myocardial infarction  . ELBOW SURGERY    . KNEE SURGERY    . NASAL SINUS SURGERY    . SPERMATOCELECTOMY    . WRIST SURGERY      reports that he quit smoking about 36 years ago. His smoking use included cigarettes. He has a 7.50 pack-year smoking history. He has never used smokeless tobacco. He reports that he does not drink alcohol or use drugs. family history includes Breast cancer in his maternal aunt; Cancer in his maternal aunt; Colon cancer in his maternal aunt; Coronary artery disease in his father; Diabetes in his father; Esophageal cancer in his mother; Heart disease in his father, maternal aunt, maternal grandfather, maternal grandmother, maternal uncle, and mother; Hip fracture in his father; Liver cancer in his maternal uncle; Prostate cancer in his brother. Allergies  Allergen Reactions  . Amoxicillin     Per the pt "it was years ago and does not remember the reaction"   Current Outpatient Medications on File Prior to Visit  Medication Sig Dispense Refill  . aspirin (ASPIRIN ADULT LOW STRENGTH) 81 MG EC tablet Take 1 tablet (81 mg total) by mouth daily. 90 tablet 3  . azithromycin (ZITHROMAX Z-PAK) 250 MG tablet 2 tab by mouth day 1, then 1 per day 6 tablet 1  . clobetasol cream (TEMOVATE) 6.94 % APPLY 1 APPLICATION  TOPICALLY 2 TIMES DAILY. 30 g 5  . escitalopram (LEXAPRO) 10 MG tablet TAKE 1 TABLET BY MOUTH EVERY DAY 90 tablet 1  . Magnesium 500 MG CAPS Take 1 capsule by mouth daily.     . metFORMIN (GLUCOPHAGE-XR) 500 MG 24 hr tablet TAKE 1 TABLET BY MOUTH EVERY DAY WITH BREAKFAST 90 tablet 1  . nitroGLYCERIN (NITROSTAT) 0.4 MG SL tablet Place 1 tablet (0.4 mg total) under the tongue every 5 (five) minutes as needed for chest pain. 25 tablet 3  . rosuvastatin (CRESTOR) 5 MG tablet Take 1 tablet (5 mg total) by mouth daily at 6 PM. 90  tablet 3  . sildenafil (REVATIO) 20 MG tablet Take 2-5 tablets once daily as needed prior to sexual activity. 20 tablet 1  . traMADol (ULTRAM) 50 MG tablet TAKE 1 TABLET BY MOUTH TWICE A DAY AS NEEDED FOR PAIN 60 tablet 2   No current facility-administered medications on file prior to visit.    Review of Systems Constitutional: Negative for other unusual diaphoresis, sweats, appetite or weight changes HENT: Negative for other worsening hearing loss, ear pain, facial swelling, mouth sores or neck stiffness.   Eyes: Negative for other worsening pain, redness or other visual disturbance.  Respiratory: Negative for other stridor or swelling Cardiovascular: Negative for other palpitations or other chest pain  Gastrointestinal: Negative for worsening diarrhea or loose stools, blood in stool, distention or other pain Genitourinary: Negative for hematuria, flank pain or other change in urine volume.  Musculoskeletal: Negative for myalgias or other joint swelling.  Skin: Negative for other color change, or other wound or worsening drainage.  Neurological: Negative for other syncope or numbness. Hematological: Negative for other adenopathy or swelling Psychiatric/Behavioral: Negative for hallucinations, other worsening agitation, SI, self-injury, or new decreased concentration All other system neg per pt    Objective:   Physical Exam BP 116/78   Pulse 86   Temp 97.8 F (36.6 C) (Oral)   Ht 5\' 5"  (1.651 m)   Wt 173 lb (78.5 kg)   SpO2 96%   BMI 28.79 kg/m  VS noted,  Constitutional: Pt is oriented to person, place, and time. Appears well-developed and well-nourished, in no significant distress and comfortable Head: Normocephalic and atraumatic  Eyes: Conjunctivae and EOM are normal. Pupils are equal, round, and reactive to light Right Ear: External ear normal without discharge Left Ear: External ear normal without discharge Nose: Nose without discharge or deformity Mouth/Throat:  Oropharynx is without other ulcerations and moist  Neck: Normal range of motion. Neck supple. No JVD present. No tracheal deviation present or significant neck LA or mass Cardiovascular: Normal rate, regular rhythm, normal heart sounds and intact distal pulses.   Pulmonary/Chest: WOB normal and breath sounds without rales or wheezing  Abdominal: Soft. Bowel sounds are normal. NT. No HSM  Musculoskeletal: Normal range of motion. Exhibits no edema Lymphadenopathy: Has no other cervical adenopathy.  Neurological: Pt is alert and oriented to person, place, and time. Pt has normal reflexes. No cranial nerve deficit. Motor grossly intact, Gait intact Skin: Skin is warm and dry. No rash noted or new ulcerations Psychiatric:  Has normal mood and affect. Behavior is normal without agitation No other exam findings  Lab Results  Component Value Date   WBC 6.3 10/31/2018   HGB 14.9 10/31/2018   HCT 43.4 10/31/2018   PLT 272.0 10/31/2018   GLUCOSE 105 (H) 10/31/2018   CHOL 140 10/31/2018   TRIG 285.0 (H) 10/31/2018   HDL 47.20  10/31/2018   LDLDIRECT 63.0 10/31/2018   LDLCALC 84 10/04/2014   ALT 33 10/31/2018   AST 24 10/31/2018   NA 140 10/31/2018   K 3.9 10/31/2018   CL 102 10/31/2018   CREATININE 1.34 10/31/2018   BUN 20 10/31/2018   CO2 28 10/31/2018   TSH 3.70 10/31/2018   PSA 0.04 (L) 10/17/2017   INR 1.00 12/22/2010   HGBA1C 7.1 (H) 10/31/2018   MICROALBUR 1.2 10/31/2018        Assessment & Plan:

## 2019-08-07 NOTE — Assessment & Plan Note (Signed)

## 2019-08-08 ENCOUNTER — Telehealth: Payer: Self-pay

## 2019-08-08 NOTE — Telephone Encounter (Signed)
Pt has viewed results via MyChart  

## 2019-08-08 NOTE — Telephone Encounter (Signed)
-----   Message from Biagio Borg, MD sent at 08/07/2019  1:18 PM EDT ----- Left message on MyChart, pt to cont same tx except  The test results show that your current treatment is OK, as the tests are stable, except for low vitamin D level  Please take Vitamin D 50000 units weekly for 12 weeks, then plan to change to OTC Vitamin D3 at 2000 units per day, indefinitely.Redmond Baseman to please inform pt, I will do rx

## 2019-09-01 ENCOUNTER — Other Ambulatory Visit: Payer: Self-pay | Admitting: Internal Medicine

## 2019-10-27 ENCOUNTER — Other Ambulatory Visit: Payer: Self-pay | Admitting: Internal Medicine

## 2019-10-30 ENCOUNTER — Other Ambulatory Visit: Payer: Self-pay | Admitting: Internal Medicine

## 2019-10-30 NOTE — Telephone Encounter (Signed)
Done erx 

## 2020-01-17 DIAGNOSIS — M25562 Pain in left knee: Secondary | ICD-10-CM | POA: Insufficient documentation

## 2020-01-24 ENCOUNTER — Other Ambulatory Visit: Payer: Self-pay

## 2020-01-24 ENCOUNTER — Ambulatory Visit: Payer: BC Managed Care – PPO | Admitting: Cardiology

## 2020-01-24 ENCOUNTER — Encounter: Payer: Self-pay | Admitting: Cardiology

## 2020-01-24 VITALS — BP 128/82 | HR 84 | Ht 65.0 in | Wt 178.0 lb

## 2020-01-24 DIAGNOSIS — I2510 Atherosclerotic heart disease of native coronary artery without angina pectoris: Secondary | ICD-10-CM | POA: Diagnosis not present

## 2020-01-24 DIAGNOSIS — E782 Mixed hyperlipidemia: Secondary | ICD-10-CM

## 2020-01-24 DIAGNOSIS — N5235 Erectile dysfunction following radiation therapy: Secondary | ICD-10-CM | POA: Diagnosis not present

## 2020-01-24 NOTE — Patient Instructions (Signed)
Medication Instructions:  Your physician recommends that you continue on your current medications as directed. Please refer to the Current Medication list given to you today.  *If you need a refill on your cardiac medications before your next appointment, please call your pharmacy*  Lab Work: None ordered   If you have labs (blood work) drawn today and your tests are completely normal, you will receive your results only by: Marland Kitchen MyChart Message (if you have MyChart) OR . A paper copy in the mail If you have any lab test that is abnormal or we need to change your treatment, we will call you to review the results.  Testing/Procedures: None ordered   Follow-Up: At [facility], you and your health needs are our priority.  As part of our continuing mission to provide you with exceptional heart care, we have created designated Provider Care Teams.  These Care Teams include your primary Cardiologist (physician) and Advanced Practice Providers (APPs -  Physician Assistants and Nurse Practitioners) who all work together to provide you with the care you need, when you need it.  Your next appointment:   1 year(s)  The format for your next appointment:   In Person  Provider:   You may see Sherren Mocha, MD or one of the following Advanced Practice Providers on your designated Care Team:    Richardson Dopp, PA-C  [place], [place]  Daune Perch, NP   Other Instructions Lifestyle Modifications to Prevent and Treat Heart Disease -Recommend heart healthy/Mediterranean diet, with whole grains, fruits, vegetables, fish, lean meats, nuts, olive oil and avocado oil.  -Limit salt intake to less than 2000 mg per day.  -Recommend moderate walking, starting slowly with a few minutes and working up to 3-5 times/week for 30-50 minutes each session. Aim for at least 150 minutes.week. Goal should be pace of 3 miles/hours, or walking 1.5 miles in 30 minutes -Recommend avoidance of tobacco products. Avoid  excess alcohol. -Keep blood pressure well controlled, ideally less than 130/80.    Mediterranean Diet A Mediterranean diet refers to food and lifestyle choices that are based on the traditions of countries located on the [employer]. This way of eating has been shown to help prevent certain conditions and improve outcomes for people who have chronic diseases, like kidney disease and heart disease. What are tips for following this plan? Lifestyle  Cook and eat meals together with your family, when possible.  Drink enough fluid to keep your urine clear or pale yellow.  Be physically active every day. This includes: ? Aerobic exercise like running or swimming. ? Leisure activities like gardening, walking, or housework.  Get 7-8 hours of sleep each night.  If recommended by your health care provider, drink red wine in moderation. This means 1 glass a day for nonpregnant women and 2 glasses a day for men. A glass of wine equals 5 oz (150 mL). Reading food labels   Check the serving size of packaged foods. For foods such as rice and pasta, the serving size refers to the amount of cooked product, not dry.  Check the total fat in packaged foods. Avoid foods that have saturated fat or trans fats.  Check the ingredients list for added sugars, such as corn syrup. Shopping  At the grocery store, buy most of your food from the areas near the walls of the store. This includes: ? Fresh fruits and vegetables (produce). ? Grains, beans, nuts, and seeds. Some of these may be available in unpackaged forms  or large amounts (in bulk). ? Fresh seafood. ? Poultry and eggs. ? Low-fat dairy products.  Buy whole ingredients instead of prepackaged foods.  Buy fresh fruits and vegetables in-season from local farmers markets.  Buy frozen fruits and vegetables in resealable bags.  If you do not have access to quality fresh seafood, buy precooked frozen shrimp or canned fish, such as tuna, salmon,  or sardines.  Buy small amounts of raw or cooked vegetables, salads, or olives from the deli or salad bar at your store.  Stock your pantry so you always have certain foods on hand, such as olive oil, canned tuna, canned tomatoes, rice, pasta, and beans. Cooking  Cook foods with extra-virgin olive oil instead of using butter or other vegetable oils.  Have meat as a side dish, and have vegetables or grains as your main dish. This means having meat in small portions or adding small amounts of meat to foods like pasta or stew.  Use beans or vegetables instead of meat in common dishes like chili or lasagna.  Experiment with different cooking methods. Try roasting or broiling vegetables instead of steaming or sauteing them.  Add frozen vegetables to soups, stews, pasta, or rice.  Add nuts or seeds for added healthy fat at each meal. You can add these to yogurt, salads, or vegetable dishes.  Marinate fish or vegetables using olive oil, lemon juice, garlic, and fresh herbs. Meal planning   Plan to eat 1 vegetarian meal one day each week. Try to work up to 2 vegetarian meals, if possible.  Eat seafood 2 or more times a week.  Have healthy snacks readily available, such as: ? Vegetable sticks with hummus. ? [place] yogurt. ? Fruit and nut trail mix.  Eat balanced meals throughout the week. This includes: ? Fruit: 2-3 servings a day ? Vegetables: 4-5 servings a day ? Low-fat dairy: 2 servings a day ? Fish, poultry, or lean meat: 1 serving a day ? Beans and legumes: 2 or more servings a week ? Nuts and seeds: 1-2 servings a day ? Whole grains: 6-8 servings a day ? Extra-virgin olive oil: 3-4 servings a day  Limit red meat and sweets to only a few servings a month What are my food choices?  Mediterranean diet ? Recommended  Grains: Whole-grain pasta. Brown rice. Bulgar wheat. Polenta. Couscous. Whole-wheat bread. [place].  Vegetables: Artichokes. Beets. Broccoli. Cabbage.  Carrots. Eggplant. Green beans. Chard. Kale. Spinach. Onions. Leeks. Peas. Squash. Tomatoes. Peppers. Radishes.  Fruits: Apples. Apricots. Avocado. Berries. Bananas. Cherries. Dates. Figs. Grapes. Lemons. Melon. Oranges. Peaches. Plums. Pomegranate.  Meats and other protein foods: Beans. Almonds. Sunflower seeds. Pine nuts. Peanuts. [place]. Salmon. Scallops. Shrimp. [place]. Tilapia. Clams. Oysters. Eggs.  Dairy: Low-fat milk. Cheese. Greek yogurt.  Beverages: Water. Red wine. Herbal tea.  Fats and oils: Extra virgin olive oil. Avocado oil. Grape seed oil.  Sweets and desserts: [place] yogurt with honey. Baked apples. Poached pears. Trail mix.  Seasoning and other foods: Basil. Cilantro. Coriander. Cumin. Mint. Parsley. Sage. Rosemary. Tarragon. Garlic. Oregano. Thyme. Pepper. Balsalmic vinegar. Tahini. Hummus. Tomato sauce. Olives. Mushrooms. ? Limit these  Grains: Prepackaged pasta or rice dishes. Prepackaged cereal with added sugar.  Vegetables: Deep fried potatoes (french fries).  Fruits: Fruit canned in syrup.  Meats and other protein foods: Beef. Pork. Lamb. Poultry with skin. Hot dogs. Bernie[place] Salines.  Dairy: Ice cream. Sour cream. Whole milk.  Beverages: Juice. Sugar-sweetened soft drinks. Beer. Liquor and spirits.  Fats and oils: Butter. Canola  oil. Vegetable oil. Beef fat (tallow). Lard.  Sweets and desserts: Cookies. Cakes. Pies. Candy.  Seasoning and other foods: Mayonnaise. Premade sauces and marinades. The items listed may not be a complete list. Talk with your dietitian about what dietary choices are right for you. Summary  The Mediterranean diet includes both food and lifestyle choices.  Eat a variety of fresh fruits and vegetables, beans, nuts, seeds, and whole grains.  Limit the amount of red meat and sweets that you eat.  Talk with your health care provider about whether it is safe for you to drink red wine in moderation. This means 1 glass a day for nonpregnant women and  2 glasses a day for men. A glass of wine equals 5 oz (150 mL). This information is not intended to replace advice given to you by your health care provider. Make sure you discuss any questions you have with your health care provider. Document Revised: 08/05/2016 Document Reviewed: 07/29/2016 Elsevier Patient Education  [place].

## 2020-01-24 NOTE — Progress Notes (Addendum)
Cardiology Office Note:    Date:  01/24/2020   ID:  [name] Gross, DOB Aug 09, 1961, MRN [id number]  PCP:  Biagio Borg, MD  Cardiologist:  Sherren Mocha, MD  Referring MD: Biagio Borg, MD   Chief Complaint  Patient presents with  . Follow-up  . Coronary Artery Disease    History of Present Illness:    [name] Gross is a 59 y.o. male with a past medical history significant for CAD, prostate cancer, mild OSA not on CPAP, obesity, hypertension, GERD, asthma and anxiety.  He initially presented to cardiology with acute inferior wall STEMI in 2011 that was treated with bare-metal stenting of the RCA.  The patient had acute stent thrombosis and a second stent was placed.  LVEF has been preserved at 55-65%.  He was initially diagnosed with prostate cancer in 2008 and underwent prostatectomy at that time.  His PSA began to rise and he had to undergo radiation therapy in 2019 (38 treatments).   Mr. Lay is here today for annual follow up. He had COVID 19 in September with 10 days of fever and fatigue. He did not need to be hospitalized. He has fully recovered. He is currently active. 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 He plays golf ~once a week and does yard work. He feels that he is very active, although does not do organized exercise. He denies chest pain/pressure, shortness of breath, orthopnea, PND, edema, palpitations, lightheadedness or syncope.   He has an injury to his left knee and may possibly need knee surgery.   Cardiac studies     Past Medical History:  Diagnosis Date  . Allergic rhinitis   . Anxiety   . Asthma    mild intermittent  . CAD (coronary artery disease)    s/p INF STEMI 12/04/2010: treated with BMS to RCA c/b acute stent thrombosis at completion of  primary PCI ...above tx with second BMS with adjunctive thrombectomy residual CAD at  cath 12/04/2010: pRCA 40-50%; LAD and CFX ok; EF 45% Echo 12/06/2010: Ef 55-65%; ?HK inferior wall; mild MR  . Erectile dysfunction   . GERD (gastroesophageal reflux disease)   . Glucose intolerance (impaired glucose tolerance)   . HTN (hypertension)   . Obesity   . OSA (obstructive sleep apnea)    mild, no CPAP per pt preference  . Osteoarthritis   . Prostate cancer [facility])     Past Surgical History:  Procedure Laterality Date  . CORONARY STENT PLACEMENT     Stenting of the right coronary artery......... Successful percutaneous coronary intervention with  adjunctive thrombectomy for treatment of acute stent thrombosis and   reocclusion of the right coronary artery after treatment earlier today  of an acute myocardial infarction  . ELBOW SURGERY    . KNEE SURGERY    . NASAL SINUS SURGERY    . SPERMATOCELECTOMY    . WRIST SURGERY      Current Medications: Current Meds  Medication Sig  . aspirin (ASPIRIN ADULT LOW STRENGTH) 81 MG EC tablet Take 1 tablet (81 mg total) by mouth daily.  . clobetasol cream (TEMOVATE) [id number] % APPLY 1 APPLICATION TOPICALLY 2 TIMES DAILY.  Marland Kitchen escitalopram (LEXAPRO) 10 MG tablet TAKE 1 TABLET BY MOUTH EVERY DAY  . Magnesium 500 MG CAPS Take 1 capsule by mouth daily.   Marland Kitchen  metFORMIN (GLUCOPHAGE-XR) 500 MG 24 hr tablet TAKE 1 TABLET BY MOUTH EVERY DAY WITH BREAKFAST  . nitroGLYCERIN (NITROSTAT) 0.4 MG SL tablet Place 1 tablet (0.4 mg total) under the tongue every 5 (five) minutes as needed for chest pain.  . rosuvastatin (CRESTOR) 5 MG tablet Take 1 tablet (5 mg total) by mouth daily at 6 PM.  . sildenafil (REVATIO) 20 MG tablet Take 2-5 tablets once daily as needed prior to sexual activity.  . traMADol (ULTRAM) 50 MG tablet TAKE 1 TABLET BY MOUTH TWICE A DAY AS NEEDED FOR PAIN     Allergies:   Amoxicillin   Social History   Socioeconomic History  . Marital status: Married    Spouse name: Not on file  . Number of children: 2  . Years of education: Not on  file  . Highest education level: Not on file  Occupational History    Comment: full time  Tobacco Use  . Smoking status: Former Smoker    Packs/day: 0.50    Years: 15.00    Pack years: 7.50    Types: Cigarettes    Quit date: 10/20/1982    Years since quitting: 37.2  . Smokeless tobacco: Never Used  Substance and Sexual Activity  . Alcohol use: No  . Drug use: No  . Sexual activity: Yes  Other Topics Concern  . 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 Resides in [place] with wife. Has one son and one daughter.   Social Determinants of Health   Financial Resource Strain:   . Difficulty of Paying Living Expenses: Not on file  Food Insecurity:   . Worried About Charity fundraiser in the Last Year: Not on file  . Ran Out of Food in the Last Year: Not on file  Transportation Needs:   . Lack of Transportation (Medical): Not on file  . Lack of Transportation (Non-Medical): Not on file  Physical Activity:   . Days of Exercise per Week: Not on file  . Minutes of Exercise per Session: Not on file  Stress:   . Feeling of Stress : Not on file  Social Connections:   . Frequency of Communication with Friends and Family: Not on file  . Frequency of Social Gatherings with Friends and Family: Not on file  . Attends Religious Services: Not on file  . Active Member of Clubs or Organizations: Not on file  . Attends Archivist Meetings: Not on file  . Marital Status: Not on file     Family History: The patient's family history includes Breast cancer in his maternal aunt; Cancer in his maternal aunt; Colon cancer in his maternal aunt; Coronary artery disease in his father; Diabetes in his father; Esophageal cancer in his mother; Heart disease in his father, maternal aunt, maternal grandfather, maternal grandmother, maternal uncle, and mother; Hip fracture in his father; Liver cancer in his maternal uncle; Prostate cancer in his brother. There is no  history of Rectal cancer or Stomach cancer. ROS:   Please see the history of present illness.     All other systems reviewed and are negative.   EKG:  EKG is ordered today.  The ekg ordered today demonstrates normal sinus rhythm, 84 bpm, QTC 432, no changes.  Recent Labs: 08/07/2019: ALT 38; BUN 16; Creatinine, Ser 1.25; Hemoglobin 14.8; Platelets 283.0; Potassium 3.9; Sodium 136; TSH 2.25   Recent Lipid Panel    Component Value Date/Time   CHOL 172  08/07/2019 1059   TRIG 389.0 (H) 08/07/2019 1059   TRIG 241 (HH) 10/31/2006 0917   HDL 54.30 08/07/2019 1059   CHOLHDL 3 08/07/2019 1059   VLDL 77.8 (H) 08/07/2019 1059   LDLCALC 84 10/04/2014 0828   LDLDIRECT 68.0 08/07/2019 1059    Physical Exam:    VS:  BP 128/82   Pulse 84   Ht 5\' 5"  (1.651 m)   Wt 178 lb (80.7 kg)   SpO2 98%   BMI 29.62 kg/m     Wt Readings from Last 6 Encounters:  01/24/20 178 lb (80.7 kg)  08/07/19 173 lb (78.5 kg)  02/06/19 182 lb (82.6 kg)  01/22/19 178 lb 12.8 oz (81.1 kg)  10/31/18 180 lb (81.6 kg)  10/26/18 179 lb 3.2 oz (81.3 kg)     Physical Exam  Constitutional: He is oriented to person, place, and time. He appears well-developed and well-nourished. No distress.  HENT:  Head: Normocephalic and atraumatic.  Neck: No JVD present.  Cardiovascular: Normal rate, regular rhythm, normal heart sounds and intact distal pulses. Exam reveals no gallop and no friction rub.  No murmur heard. Pulmonary/Chest: Effort normal and breath sounds normal. No respiratory distress. He has no wheezes. He has no rales.  Abdominal: Soft. Bowel sounds are normal.  Musculoskeletal:        General: No edema. Normal range of motion.     Cervical back: Normal range of motion and neck supple.  Neurological: He is alert and oriented to person, place, and time.  Skin: Skin is warm and dry.  Psychiatric: He has a normal mood and affect. His behavior is normal. Judgment and thought content normal.  Vitals  reviewed.   ASSESSMENT:    1. Coronary artery disease involving native coronary artery of native heart without angina pectoris   2. Mixed hyperlipidemia   3. Erectile dysfunction following radiation therapy    PLAN:    In order of problems listed above:  Coronary artery disease -Hx of remote STEMI with bare-metal stenting of the RCA in 2011. No issues since then. -Continues on aspirin and statin. -Pt is active with no exertional symptoms.  -Heart healthy lifestyle encouraged with heart healthy diet and exercise.  Hyperlipidemia -On rosuvastatin at low dose, limited by tolerance. -Lipid panel in 07/2019 showed LDL of 68 which is at goal of less than 70.  Continue current therapy. -Triglycerides have been elevated, 389 in 07/2019.  We discussed heart healthy diet and limiting concentrated sweets and carbohydrates. -Pt offered fenofibrate but he is adamant the he does not want to take any more medications.  -He loves to eat sweets especially at night.  He also eats out at a diner for all 3 meals of the day.  The only vegetable he eats is green beans.  We had a long discussion on Mediterranean diet and he does not seem to be open to making significant changes.  Erectile dysfunction following radiation therapy -Viagra did not help.   Left knee pain -Patient reports that he is being followed by orthopedist and he may need surgery. -Patient would be at low risk for surgery and can proceed without any further cardiovascular testing. -Regarding ASA therapy, we recommend continuation of ASA throughout the perioperative period.  However, if the surgeon feels that cessation of ASA is required in the perioperative period, it may be stopped 5-7 days prior to surgery with a plan to resume it as soon as felt to be feasible from a surgical standpoint in  the post-operative period.   Medication Adjustments/Labs and Tests Ordered: Current medicines are reviewed at length with the patient today.   Concerns regarding medicines are outlined above. Labs and tests ordered and medication changes are outlined in the patient instructions below:  Patient Instructions  Medication Instructions:  Your physician recommends that you continue on your current medications as directed. Please refer to the Current Medication list given to you today.  *If you need a refill on your cardiac medications before your next appointment, please call your pharmacy*  Lab Work: None ordered   If you have labs (blood work) drawn today and your tests are completely normal, you will receive your results only by: Marland Kitchen MyChart Message (if you have MyChart) OR . A paper copy in the mail If you have any lab test that is abnormal or we need to change your treatment, we will call you to review the results.  Testing/Procedures: None ordered   Follow-Up: At [facility], you and your health needs are our priority.  As part of our continuing mission to provide you with exceptional heart care, we have created designated Provider Care Teams.  These Care Teams include your primary Cardiologist (physician) and Advanced Practice Providers (APPs -  Physician Assistants and Nurse Practitioners) who all work together to provide you with the care you need, when you need it.  Your next appointment:   1 year(s)  The format for your next appointment:   In Person  Provider:   You may see Sherren Mocha, MD or one of the following Advanced Practice Providers on your designated Care Team:    Richardson Dopp, PA-C  [place], [place]  Daune Perch, NP   Other Instructions Lifestyle Modifications to Prevent and Treat Heart Disease -Recommend heart healthy/Mediterranean diet, with whole grains, fruits, vegetables, fish, lean meats, nuts, olive oil and avocado oil.  -Limit salt intake to less than 2000 mg per day.  -Recommend moderate walking, starting slowly with a few minutes and working up to 3-5 times/week for 30-50 minutes each  session. Aim for at least 150 minutes.week. Goal should be pace of 3 miles/hours, or walking 1.5 miles in 30 minutes -Recommend avoidance of tobacco products. Avoid excess alcohol. -Keep blood pressure well controlled, ideally less than 130/80.    Mediterranean Diet A Mediterranean diet refers to food and lifestyle choices that are based on the traditions of countries located on the [employer]. This way of eating has been shown to help prevent certain conditions and improve outcomes for people who have chronic diseases, like kidney disease and heart disease. What are tips for following this plan? Lifestyle  Cook and eat meals together with your family, when possible.  Drink enough fluid to keep your urine clear or pale yellow.  Be physically active every day. This includes: ? Aerobic exercise like running or swimming. ? Leisure activities like gardening, walking, or housework.  Get 7-8 hours of sleep each night.  If recommended by your health care provider, drink red wine in moderation. This means 1 glass a day for nonpregnant women and 2 glasses a day for men. A glass of wine equals 5 oz (150 mL). Reading food labels   Check the serving size of packaged foods. For foods such as rice and pasta, the serving size refers to the amount of cooked product, not dry.  Check the total fat in packaged foods. Avoid foods that have saturated fat or trans fats.  Check the ingredients list for added sugars, such  as corn syrup. Shopping  At the grocery store, buy most of your food from the areas near the walls of the store. This includes: ? Fresh fruits and vegetables (produce). ? Grains, beans, nuts, and seeds. Some of these may be available in unpackaged forms or large amounts (in bulk). ? Fresh seafood. ? Poultry and eggs. ? Low-fat dairy products.  Buy whole ingredients instead of prepackaged foods.  Buy fresh fruits and vegetables in-season from local farmers markets.  Buy  frozen fruits and vegetables in resealable bags.  If you do not have access to quality fresh seafood, buy precooked frozen shrimp or canned fish, such as tuna, salmon, or sardines.  Buy small amounts of raw or cooked vegetables, salads, or olives from the deli or salad bar at your store.  Stock your pantry so you always have certain foods on hand, such as olive oil, canned tuna, canned tomatoes, rice, pasta, and beans. Cooking  Cook foods with extra-virgin olive oil instead of using butter or other vegetable oils.  Have meat as a side dish, and have vegetables or grains as your main dish. This means having meat in small portions or adding small amounts of meat to foods like pasta or stew.  Use beans or vegetables instead of meat in common dishes like chili or lasagna.  Experiment with different cooking methods. Try roasting or broiling vegetables instead of steaming or sauteing them.  Add frozen vegetables to soups, stews, pasta, or rice.  Add nuts or seeds for added healthy fat at each meal. You can add these to yogurt, salads, or vegetable dishes.  Marinate fish or vegetables using olive oil, lemon juice, garlic, and fresh herbs. Meal planning   Plan to eat 1 vegetarian meal one day each week. Try to work up to 2 vegetarian meals, if possible.  Eat seafood 2 or more times a week.  Have healthy snacks readily available, such as: ? Vegetable sticks with hummus. ? [place] yogurt. ? Fruit and nut trail mix.  Eat balanced meals throughout the week. This includes: ? Fruit: 2-3 servings a day ? Vegetables: 4-5 servings a day ? Low-fat dairy: 2 servings a day ? Fish, poultry, or lean meat: 1 serving a day ? Beans and legumes: 2 or more servings a week ? Nuts and seeds: 1-2 servings a day ? Whole grains: 6-8 servings a day ? Extra-virgin olive oil: 3-4 servings a day  Limit red meat and sweets to only a few servings a month What are my food choices?  Mediterranean  diet ? Recommended  Grains: Whole-grain pasta. Brown rice. Bulgar wheat. Polenta. Couscous. Whole-wheat bread. [place].  Vegetables: Artichokes. Beets. Broccoli. Cabbage. Carrots. Eggplant. Green beans. Chard. Kale. Spinach. Onions. Leeks. Peas. Squash. Tomatoes. Peppers. Radishes.  Fruits: Apples. Apricots. Avocado. Berries. Bananas. Cherries. Dates. Figs. Grapes. Lemons. Melon. Oranges. Peaches. Plums. Pomegranate.  Meats and other protein foods: Beans. Almonds. Sunflower seeds. Pine nuts. Peanuts. [place]. Salmon. Scallops. Shrimp. Eb[place]. Tilapia. Clams. Oysters. Eggs.  Dairy: Low-fat milk. Cheese. Greek yogurt.  Beverages: Water. Red wine. Herbal tea.  Fats and oils: Extra virgin olive oil. Avocado oil. Grape seed oil.  Sweets and desserts: [place] yogurt with honey. Baked apples. Poached pears. Trail mix.  Seasoning and other foods: Basil. Cilantro. Coriander. Cumin. Mint. Parsley. Sage. Rosemary. Tarragon. Garlic. Oregano. Thyme. Pepper. Balsalmic vinegar. Tahini. Hummus. Tomato sauce. Olives. Mushrooms. ? Limit these  Grains: Prepackaged pasta or rice dishes. Prepackaged cereal with added sugar.  Vegetables: Deep fried potatoes (french  fries).  Fruits: Fruit canned in syrup.  Meats and other protein foods: Beef. Pork. Lamb. Poultry with skin. Hot dogs. Berniece Salines.  Dairy: Ice cream. Sour cream. Whole milk.  Beverages: Juice. Sugar-sweetened soft drinks. Beer. Liquor and spirits.  Fats and oils: Butter. Canola oil. Vegetable oil. Beef fat (tallow). Lard.  Sweets and desserts: Cookies. Cakes. Pies. Candy.  Seasoning and other foods: Mayonnaise. Premade sauces and marinades. The items listed may not be a complete list. Talk with your dietitian about what dietary choices are right for you. Summary  The Mediterranean diet includes both food and lifestyle choices.  Eat a variety of fresh fruits and vegetables, beans, nuts, seeds, and whole grains.  Limit the amount of red  meat and sweets that you eat.  Talk with your health care provider about whether it is safe for you to drink red wine in moderation. This means 1 glass a day for nonpregnant women and 2 glasses a day for men. A glass of wine equals 5 oz (150 mL). This information is not intended to replace advice given to you by your health care provider. Make sure you discuss any questions you have with your health care provider. Document Revised: 08/05/2016 Document Reviewed: 07/29/2016 Elsevier Patient Education  2020 [place], Daune Perch, NP  01/24/2020 [facility]

## 2020-01-29 NOTE — Addendum Note (Signed)
Addended by: Jacinta Shoe on: 01/29/2020 09:26 AM   Modules accepted: Orders

## 2020-02-05 ENCOUNTER — Other Ambulatory Visit: Payer: Self-pay | Admitting: Cardiovascular Disease

## 2020-02-06 ENCOUNTER — Telehealth: Payer: Self-pay | Admitting: *Deleted

## 2020-02-06 NOTE — Telephone Encounter (Signed)
   [facility] Pre-operative Risk Assessment    Request for surgical clearance:  1. What type of surgery is being performed? LEFT KNEE SCOPE  2. When is this surgery scheduled? 02/26/20  3. What type of clearance is required (medical clearance vs. Pharmacy clearance to hold med vs. Both)? MEDICAL  4. Are there any medications that need to be held prior to surgery and how long? ASA   5. Practice name and name of physician performing surgery? [facility]; DR. FRANK ALUISIO   6. What is your office phone number [phone]    7.   What is your office fax number [phone] ATTN: KELLY HANCOCK  8.   Anesthesia type (None, local, MAC, general) ? MAC   Julaine Hua 02/06/2020, 11:18 AM  _________________________________________________________________   (provider comments below)

## 2020-02-07 NOTE — Telephone Encounter (Signed)
   Primary Cardiologist: Sherren Mocha, MD  Chart reviewed as part of pre-operative protocol coverage. Patient was contacted 02/07/2020 in reference to pre-operative risk assessment for pending surgery as outlined below.  [name] Gross was last seen on 01/24/20 by Pecolia Ades NP.  Per her note: -Patient reports that he is being followed by orthopedist and he may need surgery. -Patient would be at low risk for surgery and can proceed without any further cardiovascular testing. -Regarding ASA therapy, we recommend continuation of ASA throughout the perioperative period.  However, if the surgeon feels that cessation of ASA is required in the perioperative period, it may be stopped 5-7 days prior to surgery with a plan to resume it as soon as felt to be feasible from a surgical standpoint in the post-operative period.   Therefore, based on ACC/AHA guidelines, the patient would be at acceptable risk for the planned procedure without further cardiovascular testing.   I will route this recommendation to the requesting party via Epic fax function and remove from pre-op pool.  Please call with questions.  Tami Lin Mitsuko Luera, PA 02/07/2020, 8:41 AM

## 2020-02-08 ENCOUNTER — Ambulatory Visit: Payer: BC Managed Care – PPO | Admitting: Internal Medicine

## 2020-02-12 ENCOUNTER — Other Ambulatory Visit: Payer: Self-pay

## 2020-02-12 ENCOUNTER — Encounter: Payer: Self-pay | Admitting: Internal Medicine

## 2020-02-12 ENCOUNTER — Ambulatory Visit: Payer: BC Managed Care – PPO | Admitting: Internal Medicine

## 2020-02-12 VITALS — BP 130/84 | HR 71 | Temp 98.0°F | Ht 65.0 in | Wt 178.2 lb

## 2020-02-12 DIAGNOSIS — R030 Elevated blood-pressure reading, without diagnosis of hypertension: Secondary | ICD-10-CM

## 2020-02-12 DIAGNOSIS — E119 Type 2 diabetes mellitus without complications: Secondary | ICD-10-CM | POA: Diagnosis not present

## 2020-02-12 DIAGNOSIS — E781 Pure hyperglyceridemia: Secondary | ICD-10-CM | POA: Diagnosis not present

## 2020-02-12 DIAGNOSIS — E559 Vitamin D deficiency, unspecified: Secondary | ICD-10-CM | POA: Insufficient documentation

## 2020-02-12 LAB — BASIC METABOLIC PANEL
BUN: 18 mg/dL (ref 6–23)
CO2: 27 mEq/L (ref 19–32)
Calcium: 9.7 mg/dL (ref 8.4–10.5)
Chloride: 103 mEq/L (ref 96–112)
Creatinine, Ser: 1.18 mg/dL (ref 0.40–1.50)
GFR: 63.2 mL/min (ref >60.00)
Glucose, Bld: 178 mg/dL — ABNORMAL HIGH (ref 70–99)
Potassium: 4.2 mEq/L (ref 3.5–5.1)
Sodium: 138 mEq/L (ref 135–145)

## 2020-02-12 LAB — LIPID PANEL
Cholesterol: 154 mg/dL (ref 0–200)
HDL: 43.2 mg/dL (ref >39.00)
NonHDL: 110.59
Total CHOL/HDL Ratio: 4
Triglycerides: 266 mg/dL — ABNORMAL HIGH (ref 0.0–149.0)
VLDL: 53.2 mg/dL — ABNORMAL HIGH (ref 0.0–40.0)

## 2020-02-12 LAB — HEPATIC FUNCTION PANEL
ALT: 56 U/L — ABNORMAL HIGH (ref 0–53)
AST: 31 U/L (ref 0–37)
Albumin: 4.5 g/dL (ref 3.5–5.2)
Alkaline Phosphatase: 82 U/L (ref 39–117)
Bilirubin, Direct: 0.1 mg/dL (ref 0.0–0.3)
Total Bilirubin: 0.4 mg/dL (ref 0.2–1.2)
Total Protein: 6.8 g/dL (ref 6.0–8.3)

## 2020-02-12 LAB — LDL CHOLESTEROL, DIRECT: Direct LDL: 55 mg/dL

## 2020-02-12 LAB — VITAMIN D 25 HYDROXY (VIT D DEFICIENCY, FRACTURES): VITD: 25.26 ng/mL — ABNORMAL LOW (ref 30.00–100.00)

## 2020-02-12 MED ORDER — METFORMIN HCL ER 500 MG PO TB24: 1000.0000 mg | ORAL_TABLET | Freq: Every day | ORAL | 3 refills | Status: DC

## 2020-02-12 NOTE — Progress Notes (Signed)
Subjective:    Patient ID: [name] Gross, male    DOB: 06-11-1961, 59 y.o.   MRN: [id number]  HPI  Here to f/u; overall doing ok,  Pt denies chest pain, increasing sob or doe, wheezing, orthopnea, PND, increased LE swelling, palpitations, dizziness or syncope.  Pt denies new neurological symptoms such as new headache, or facial or extremity weakness or numbness.  Pt denies polydipsia, polyuria, or low sugar episode.  Pt states overall good compliance with meds, mostly trying to follow appropriate diet, with wt overall stable,  but little exercise however. Due for left arthroscopy mar 9 for torn meniscus. S/p covid in sept 2020.  Due for COVID shot through work.  CBGs in am average about 160-180   Past Medical History:  Diagnosis Date  . Allergic rhinitis   . Anxiety   . Asthma    mild intermittent  . CAD (coronary artery disease)    s/p INF STEMI 12/04/2010: treated with BMS to RCA c/b acute stent thrombosis at completion of  primary PCI ...above tx with second BMS with adjunctive thrombectomy residual CAD at cath 12/04/2010: pRCA 40-50%; LAD and CFX ok; EF 45% Echo 12/06/2010: Ef 55-65%; ?HK inferior wall; mild MR  . Erectile dysfunction   . GERD (gastroesophageal reflux disease)   . Glucose intolerance (impaired glucose tolerance)   . HTN (hypertension)   . Obesity   . OSA (obstructive sleep apnea)    mild, no CPAP per pt preference  . Osteoarthritis   . Prostate cancer [facility])    Past Surgical History:  Procedure Laterality Date  . CORONARY STENT PLACEMENT     Stenting of the right coronary artery......... Successful percutaneous coronary intervention with  adjunctive thrombectomy for treatment of acute stent thrombosis and   reocclusion of the right coronary artery after treatment earlier today  of an acute myocardial infarction  . ELBOW SURGERY    . KNEE SURGERY    . NASAL SINUS SURGERY    . SPERMATOCELECTOMY    . WRIST SURGERY      reports that he quit smoking about 37 years  ago. His smoking use included cigarettes. He has a 7.50 pack-year smoking history. He has never used smokeless tobacco. He reports that he does not drink alcohol or use drugs. family history includes Breast cancer in his maternal aunt; Cancer in his maternal aunt; Colon cancer in his maternal aunt; Coronary artery disease in his father; Diabetes in his father; Esophageal cancer in his mother; Heart disease in his father, maternal aunt, maternal grandfather, maternal grandmother, maternal uncle, and mother; Hip fracture in his father; Liver cancer in his maternal uncle; Prostate cancer in his brother. Allergies  Allergen Reactions  . Amoxicillin     Per the pt "it was years ago and does not remember the reaction"   Current Outpatient Medications on File Prior to Visit  Medication Sig Dispense Refill  . aspirin (ASPIRIN ADULT LOW STRENGTH) 81 MG EC tablet Take 1 tablet (81 mg total) by mouth daily. 90 tablet 3  . escitalopram (LEXAPRO) 10 MG tablet TAKE 1 TABLET BY MOUTH EVERY DAY 90 tablet 1  . Magnesium 500 MG CAPS Take 1 capsule by mouth daily.     . nitroGLYCERIN (NITROSTAT) 0.4 MG SL tablet Place 1 tablet (0.4 mg total) under the tongue every 5 (five) minutes as needed for chest pain. 25 tablet 3  . rosuvastatin (CRESTOR) 5 MG tablet TAKE 1 TABLET BY MOUTH EVERY DAY AT 6PM 90 tablet  3  . traMADol (ULTRAM) 50 MG tablet TAKE 1 TABLET BY MOUTH TWICE A DAY AS NEEDED FOR PAIN 60 tablet 2  . clobetasol cream (TEMOVATE) [id number] % APPLY 1 APPLICATION TOPICALLY 2 TIMES DAILY. 30 g 5  . sildenafil (REVATIO) 20 MG tablet Take 2-5 tablets once daily as needed prior to sexual activity. 20 tablet 1   No current facility-administered medications on file prior to visit.   Review of Systems All otherwise neg per pt     Objective:   Physical Exam BP 130/84 (BP Location: Left Arm, Patient Position: Sitting, Cuff Size: Normal)   Pulse 71   Temp 98 F (36.7 C) (Oral)   Ht 5\' 5"  (1.651 m)   Wt 178 lb 3.2 oz  (80.8 kg)   SpO2 97%   BMI 29.65 kg/m  VS noted,  Constitutional: Pt appears in NAD HENT: Head: NCAT.  Right Ear: External ear normal.  Left Ear: External ear normal.  Eyes: . Pupils are equal, round, and reactive to light. Conjunctivae and EOM are normal Nose: without d/c or deformity Neck: Neck supple. Gross normal ROM Cardiovascular: Normal rate and regular rhythm.   Pulmonary/Chest: Effort normal and breath sounds without rales or wheezing.  Abd:  Soft, NT, ND, + BS, no organomegaly Neurological: Pt is alert. At baseline orientation, motor grossly intact Skin: Skin is warm. No rashes, other new lesions, no LE edema Psychiatric: Pt behavior is normal without agitation  All otherwise neg per pt Lab Results  Component Value Date   WBC 6.2 08/07/2019   HGB 14.8 08/07/2019   HCT 44.3 08/07/2019   PLT 283.0 08/07/2019   GLUCOSE 218 (H) 08/07/2019   CHOL 172 08/07/2019   TRIG 389.0 (H) 08/07/2019   HDL 54.30 08/07/2019   LDLDIRECT 68.0 08/07/2019   LDLCALC 84 10/04/2014   ALT 38 08/07/2019   AST 24 08/07/2019   NA 136 08/07/2019   K 3.9 08/07/2019   CL 100 08/07/2019   CREATININE 1.25 08/07/2019   BUN 16 08/07/2019   CO2 28 08/07/2019   TSH 2.25 08/07/2019   PSA 0.04 (L) 10/17/2017   INR 1.00 12/22/2010   HGBA1C 7.3 (H) 08/07/2019   MICROALBUR <0.7 08/07/2019      Assessment & Plan:

## 2020-02-12 NOTE — Assessment & Plan Note (Signed)
stable overall by history and exam, recent data reviewed with pt, and pt to continue medical treatment as before,  to f/u any worsening symptoms or concerns  

## 2020-02-12 NOTE — Assessment & Plan Note (Addendum)
Mild uncontrolled, for increased metofmrin ER 500 to 2 qam,  to f/u any worsening symptoms or concerns  I spent 30 minutes in preparing to see the patient by review of recent labs, imaging and procedures, obtaining and reviewing separately obtained history, communicating with the patient and family or caregiver, ordering medications, tests or procedures, and documenting clinical information in the EHR including the differential Dx, treatment, and any further evaluation and other management of DM, hypertriglycerides, BP,  Vit d deficiency

## 2020-02-12 NOTE — Assessment & Plan Note (Signed)
For replacement 

## 2020-02-12 NOTE — Patient Instructions (Signed)
Ok to increase the metformin ER 500 mg to 2 pills in the AM  Please continue all other medications as before, and refills have been done if requested.  Please have the pharmacy call with any other refills you may need.  Please continue your efforts at being more active, low cholesterol diet, and weight control.  You are otherwise up to date with prevention measures today.  Please keep your appointments with your specialists as you may have planned  Please go to the LAB at the blood drawing area for the tests to be done  You will be contacted by phone if any changes need to be made immediately.  Otherwise, you will receive a letter about your results with an explanation, but please check with MyChart first.  Please remember to sign up for MyChart if you have not done so, as this will be important to you in the future with finding out test results, communicating by private email, and scheduling acute appointments online when needed.  Please make an Appointment to return in 6 months, or sooner if needed

## 2020-02-14 LAB — HEMOGLOBIN A1C: Hgb A1c MFr Bld: 8.4 % — ABNORMAL HIGH (ref 4.6–6.5)

## 2020-02-16 ENCOUNTER — Other Ambulatory Visit: Payer: Self-pay | Admitting: Internal Medicine

## 2020-02-16 MED ORDER — VITAMIN D (ERGOCALCIFEROL) 1.25 MG (50000 UNIT) PO CAPS: 50000.0000 [IU] | ORAL_CAPSULE | ORAL | 0 refills | Status: DC

## 2020-02-16 MED ORDER — METFORMIN HCL ER 500 MG PO TB24: ORAL_TABLET | ORAL | 3 refills | Status: DC

## 2020-02-26 ENCOUNTER — Encounter: Payer: Self-pay | Admitting: Internal Medicine

## 2020-03-27 ENCOUNTER — Other Ambulatory Visit: Payer: Self-pay | Admitting: Internal Medicine

## 2020-03-27 ENCOUNTER — Other Ambulatory Visit: Payer: Self-pay | Admitting: Cardiovascular Disease

## 2020-04-25 ENCOUNTER — Other Ambulatory Visit: Payer: Self-pay | Admitting: Internal Medicine

## 2020-04-25 NOTE — Telephone Encounter (Signed)
Done erx 

## 2020-05-12 ENCOUNTER — Other Ambulatory Visit: Payer: Self-pay | Admitting: Internal Medicine

## 2020-05-14 ENCOUNTER — Encounter: Payer: Self-pay | Admitting: Internal Medicine

## 2020-06-24 ENCOUNTER — Telehealth: Payer: Self-pay

## 2020-06-24 NOTE — Telephone Encounter (Signed)
Last tdap was April 2018 just 3 yrs ago, so no need for now. thanks

## 2020-06-24 NOTE — Telephone Encounter (Signed)
Sent to Dr. John to advise. 

## 2020-06-24 NOTE — Telephone Encounter (Signed)
Per [facility] on 06/22/2020 1:11:45 PM, Her husband got bit by his dog last night on his chin last night. Wants to know if he needs a tetanus shot.   Advised to see PCP within 4 hours.

## 2020-06-24 NOTE — Telephone Encounter (Signed)
LDVM for pt of Dr. Gwynn Burly note. Also informed pt if he has any concerns or questions to call the office back.

## 2020-07-22 ENCOUNTER — Ambulatory Visit (INDEPENDENT_AMBULATORY_CARE_PROVIDER_SITE_OTHER): Payer: BC Managed Care – PPO | Admitting: Internal Medicine

## 2020-07-22 ENCOUNTER — Other Ambulatory Visit: Payer: Self-pay

## 2020-07-22 ENCOUNTER — Encounter: Payer: Self-pay | Admitting: Internal Medicine

## 2020-07-22 VITALS — BP 126/70 | HR 76 | Temp 97.7°F | Ht 65.0 in | Wt 172.0 lb

## 2020-07-22 DIAGNOSIS — Z23 Encounter for immunization: Secondary | ICD-10-CM | POA: Diagnosis not present

## 2020-07-22 DIAGNOSIS — E559 Vitamin D deficiency, unspecified: Secondary | ICD-10-CM

## 2020-07-22 DIAGNOSIS — E119 Type 2 diabetes mellitus without complications: Secondary | ICD-10-CM

## 2020-07-22 DIAGNOSIS — E785 Hyperlipidemia, unspecified: Secondary | ICD-10-CM

## 2020-07-22 DIAGNOSIS — M79671 Pain in right foot: Secondary | ICD-10-CM | POA: Insufficient documentation

## 2020-07-22 DIAGNOSIS — M79672 Pain in left foot: Secondary | ICD-10-CM

## 2020-07-22 DIAGNOSIS — Z0000 Encounter for general adult medical examination without abnormal findings: Secondary | ICD-10-CM

## 2020-07-22 DIAGNOSIS — R030 Elevated blood-pressure reading, without diagnosis of hypertension: Secondary | ICD-10-CM

## 2020-07-22 DIAGNOSIS — Z0001 Encounter for general adult medical examination with abnormal findings: Secondary | ICD-10-CM

## 2020-07-22 LAB — COMPLETE METABOLIC PANEL WITH GFR
AG Ratio: 2 (calc) (ref 1.0–2.5)
ALT: 38 U/L (ref 9–46)
AST: 26 U/L (ref 10–35)
Albumin: 4.5 g/dL (ref 3.6–5.1)
Alkaline phosphatase (APISO): 87 U/L (ref 35–144)
BUN: 17 mg/dL (ref 7–25)
CO2: 30 mmol/L (ref 20–32)
Calcium: 9.8 mg/dL (ref 8.6–10.3)
Chloride: 102 mmol/L (ref 98–110)
Creat: 1.25 mg/dL (ref 0.70–1.33)
GFR, Est African American: 73 mL/min/{1.73_m2} (ref >=60)
GFR, Est Non African American: 63 mL/min/{1.73_m2} (ref >=60)
Globulin: 2.3 g/dL (calc) (ref 1.9–3.7)
Glucose, Bld: 112 mg/dL — ABNORMAL HIGH (ref 65–99)
Potassium: 4.7 mmol/L (ref 3.5–5.3)
Sodium: 139 mmol/L (ref 135–146)
Total Bilirubin: 0.4 mg/dL (ref 0.2–1.2)
Total Protein: 6.8 g/dL (ref 6.1–8.1)

## 2020-07-22 NOTE — Progress Notes (Signed)
Subjective:    Patient ID: [name] Gross, male    DOB: Sep 23, 1961, 59 y.o.   MRN: [id number]  HPI  Here for wellness and f/u;  Overall doing ok;  Pt denies Chest pain, worsening SOB, DOE, wheezing, orthopnea, PND, worsening LE edema, palpitations, dizziness or syncope.  Pt denies neurological change such as new headache, facial or extremity weakness.  Pt denies polydipsia, polyuria, or low sugar symptoms. Pt states overall good compliance with treatment and medications, good tolerability, and has been trying to follow appropriate diet.  Pt denies worsening depressive symptoms, suicidal ideation or panic. No fever, night sweats, wt loss, loss of appetite, or other constitutional symptoms.  Pt states good ability with ADL's, has low fall risk, home safety reviewed and adequate, no other significant changes in hearing or vision, and only occasionally active with exercise.  Does have ongoing issue with bilateral plantar toe pain without swelling, but persistent for months, mod, and only seems worse at night, asks for ortho referral Past Medical History:  Diagnosis Date  . Allergic rhinitis   . Anxiety   . Asthma    mild intermittent  . CAD (coronary artery disease)    s/p INF STEMI 12/04/2010: treated with BMS to RCA c/b acute stent thrombosis at completion of  primary PCI ...above tx with second BMS with adjunctive thrombectomy residual CAD at cath 12/04/2010: pRCA 40-50%; LAD and CFX ok; EF 45% Echo 12/06/2010: Ef 55-65%; ?HK inferior wall; mild MR  . Erectile dysfunction   . GERD (gastroesophageal reflux disease)   . Glucose intolerance (impaired glucose tolerance)   . HTN (hypertension)   . Obesity   . OSA (obstructive sleep apnea)    mild, no CPAP per pt preference  . Osteoarthritis   . Prostate cancer [facility])    Past Surgical History:  Procedure Laterality Date  . CORONARY STENT PLACEMENT     Stenting of the right coronary artery......... Successful percutaneous coronary intervention with   adjunctive thrombectomy for treatment of acute stent thrombosis and   reocclusion of the right coronary artery after treatment earlier today  of an acute myocardial infarction  . ELBOW SURGERY    . KNEE SURGERY    . NASAL SINUS SURGERY    . SPERMATOCELECTOMY    . WRIST SURGERY      reports that he quit smoking about 37 years ago. His smoking use included cigarettes. He has a 7.50 pack-year smoking history. He has never used smokeless tobacco. He reports that he does not drink alcohol and does not use drugs. family history includes Breast cancer in his maternal aunt; Cancer in his maternal aunt; Colon cancer in his maternal aunt; Coronary artery disease in his father; Diabetes in his father; Esophageal cancer in his mother; Heart disease in his father, maternal aunt, maternal grandfather, maternal grandmother, maternal uncle, and mother; Hip fracture in his father; Liver cancer in his maternal uncle; Prostate cancer in his brother. Allergies  Allergen Reactions  . Amoxicillin     Per the pt "it was years ago and does not remember the reaction"   Current Outpatient Medications on File Prior to Visit  Medication Sig Dispense Refill  . ASPIRIN LOW DOSE 81 MG EC tablet TAKE 1 TABLET BY MOUTH EVERY DAY 90 tablet 3  . escitalopram (LEXAPRO) 10 MG tablet TAKE 1 TABLET BY MOUTH EVERY DAY 90 tablet 1  . HYDROcodone-acetaminophen (NORCO) 5-325 MG tablet Norco 5 mg-325 mg tablet  1 po q 6 hours prn pain    .  Magnesium 500 MG CAPS Take 1 capsule by mouth daily.     . metFORMIN (GLUCOPHAGE-XR) 500 MG 24 hr tablet Take 4 tabs by mouth in the AM 360 tablet 3  . methocarbamol (ROBAXIN) 500 MG tablet methocarbamol 500 mg tablet  1 po tid prn muscle spasm    . nitroGLYCERIN (NITROSTAT) 0.4 MG SL tablet Place 1 tablet (0.4 mg total) under the tongue every 5 (five) minutes as needed for chest pain. 25 tablet 3  . rosuvastatin (CRESTOR) 5 MG tablet TAKE 1 TABLET BY MOUTH EVERY DAY AT 6PM 90 tablet 3  . traMADol  (ULTRAM) 50 MG tablet TAKE 1 TABLET BY MOUTH TWICE A DAY AS NEEDED FOR PAIN 60 tablet 2   No current facility-administered medications on file prior to visit.   Review of Systems All otherwise neg per pt    Objective:   Physical Exam BP 126/70 (BP Location: Left Arm, Patient Position: Sitting, Cuff Size: Large)   Pulse 76   Temp 97.7 F (36.5 C) (Oral)   Ht 5\' 5"  (1.651 m)   Wt 172 lb (78 kg)   SpO2 97%   BMI 28.62 kg/m  VS noted,  Constitutional: Pt appears in NAD HENT: Head: NCAT.  Right Ear: External ear normal.  Left Ear: External ear normal.  Eyes: . Pupils are equal, round, and reactive to light. Conjunctivae and EOM are normal Nose: without d/c or deformity Neck: Neck supple. Gross normal ROM Cardiovascular: Normal rate and regular rhythm.   Pulmonary/Chest: Effort normal and breath sounds without rales or wheezing.  Abd:  Soft, NT, ND, + BS, no organomegaly Neurological: Pt is alert. At baseline orientation, motor grossly intact Skin: Skin is warm. No rashes, other new lesions, no LE edema Psychiatric: Pt behavior is normal without agitation  All otherwise neg per pt Lab Results  Component Value Date   WBC 6.0 07/22/2020   HGB 15.7 07/22/2020   HCT 46.6 07/22/2020   PLT 300 07/22/2020   GLUCOSE 112 (H) 07/22/2020   CHOL 156 07/22/2020   TRIG 310 (H) 07/22/2020   HDL 54 07/22/2020   LDLDIRECT 55.0 02/12/2020   LDLCALC 65 07/22/2020   ALT 38 07/22/2020   AST 26 07/22/2020   NA 139 07/22/2020   K 4.7 07/22/2020   CL 102 07/22/2020   CREATININE 1.25 07/22/2020   BUN 17 07/22/2020   CO2 30 07/22/2020   TSH 2.82 07/22/2020   PSA <0.1 07/22/2020   INR 1.00 12/22/2010   HGBA1C 7.6 (H) 07/22/2020   MICROALBUR 0.4 07/22/2020      Assessment & Plan:

## 2020-07-22 NOTE — Patient Instructions (Addendum)
You had the Pneumovax pneumonia shot today  Please continue all other medications as before, and refills have been done if requested.  Please have the pharmacy call with any other refills you may need.  Please continue your efforts at being more active, low cholesterol diet, and weight control.  You are otherwise up to date with prevention measures today.  Please keep your appointments with your specialists as you may have planned  You will be contacted regarding the referral for: ortho Dr Doran Durand for the feet pain  Please go to the LAB at the blood drawing area for the tests to be done  You will be contacted by phone if any changes need to be made immediately.  Otherwise, you will receive a letter about your results with an explanation, but please check with MyChart first.  Please remember to sign up for MyChart if you have not done so, as this will be important to you in the future with finding out test results, communicating by private email, and scheduling acute appointments online when needed.  Please make an Appointment to return in 6 months, or sooner if needed, also with Lab Appointment for testing done 3-5 days before at the [facility] (so this is for TWO appointments - please see the scheduling desk as you leave)

## 2020-07-23 ENCOUNTER — Encounter: Payer: Self-pay | Admitting: Internal Medicine

## 2020-07-23 ENCOUNTER — Other Ambulatory Visit: Payer: Self-pay | Admitting: Internal Medicine

## 2020-07-23 LAB — CBC WITH DIFFERENTIAL/PLATELET
Absolute Monocytes: 432 cells/uL (ref 200–950)
Basophils Absolute: 120 cells/uL (ref 0–200)
Basophils Relative: 2 %
Eosinophils Absolute: 540 cells/uL — ABNORMAL HIGH (ref 15–500)
Eosinophils Relative: 9 %
HCT: 46.6 % (ref 38.5–50.0)
Hemoglobin: 15.7 g/dL (ref 13.2–17.1)
Lymphs Abs: 1188 cells/uL (ref 850–3900)
MCH: 29.2 pg (ref 27.0–33.0)
MCHC: 33.7 g/dL (ref 32.0–36.0)
MCV: 86.6 fL (ref 80.0–100.0)
MPV: 10 fL (ref 7.5–12.5)
Monocytes Relative: 7.2 %
Neutro Abs: 3720 cells/uL (ref 1500–7800)
Neutrophils Relative %: 62 %
Platelets: 300 10*3/uL (ref 140–400)
RBC: 5.38 10*6/uL (ref 4.20–5.80)
RDW: 12.7 % (ref 11.0–15.0)
Total Lymphocyte: 19.8 %
WBC: 6 10*3/uL (ref 3.8–10.8)

## 2020-07-23 LAB — HEMOGLOBIN A1C
Hgb A1c MFr Bld: 7.6 % of total Hgb — ABNORMAL HIGH (ref <5.7)
Mean Plasma Glucose: 171 (calc)
eAG (mmol/L): 9.5 (calc)

## 2020-07-23 LAB — LIPID PANEL
Cholesterol: 156 mg/dL (ref <200)
HDL: 54 mg/dL (ref >=40)
LDL Cholesterol (Calc): 65 mg/dL (calc)
Non-HDL Cholesterol (Calc): 102 mg/dL (calc) (ref <130)
Total CHOL/HDL Ratio: 2.9 (calc) (ref <5.0)
Triglycerides: 310 mg/dL — ABNORMAL HIGH (ref <150)

## 2020-07-23 LAB — URINALYSIS, ROUTINE W REFLEX MICROSCOPIC
Bilirubin Urine: NEGATIVE
Hgb urine dipstick: NEGATIVE
Ketones, ur: NEGATIVE
Leukocytes,Ua: NEGATIVE
Nitrite: NEGATIVE
Protein, ur: NEGATIVE
Specific Gravity, Urine: 1.016 (ref 1.001–1.03)
pH: 7 (ref 5.0–8.0)

## 2020-07-23 LAB — MICROALBUMIN / CREATININE URINE RATIO
Creatinine, Urine: 84 mg/dL (ref 20–320)
Microalb Creat Ratio: 5 mcg/mg creat (ref <30)
Microalb, Ur: 0.4 mg/dL

## 2020-07-23 LAB — TSH: TSH: 2.82 mIU/L (ref 0.40–4.50)

## 2020-07-23 LAB — PSA: PSA: <0.1 ng/mL (ref <=4.0)

## 2020-07-23 LAB — VITAMIN D 25 HYDROXY (VIT D DEFICIENCY, FRACTURES): Vit D, 25-Hydroxy: 33 ng/mL (ref 30–100)

## 2020-07-23 MED ORDER — GLIPIZIDE ER 2.5 MG PO TB24: 2.5000 mg | ORAL_TABLET | Freq: Every day | ORAL | 3 refills | Status: DC

## 2020-07-31 ENCOUNTER — Encounter: Payer: Self-pay | Admitting: Internal Medicine

## 2020-07-31 NOTE — Assessment & Plan Note (Signed)

## 2020-07-31 NOTE — Assessment & Plan Note (Signed)
For oral replacement 

## 2020-07-31 NOTE — Assessment & Plan Note (Signed)
stable overall by history and exam, recent data reviewed with pt, and pt to continue medical treatment as before,  to f/u any worsening symptoms or concerns  

## 2020-07-31 NOTE — Assessment & Plan Note (Signed)
Etiology unclear, for ortho referral  I spent 31 minutes in preparing to see the patient by review of recent labs, imaging and procedures, obtaining and reviewing separately obtained history, communicating with the patient and family or caregiver, ordering medications, tests or procedures, and documenting clinical information in the EHR including the differential Dx, treatment, and any further evaluation and other management of bilateral foot pain, dm, hld, elev bP without htn, vit d deficieny

## 2020-08-12 DIAGNOSIS — E1142 Type 2 diabetes mellitus with diabetic polyneuropathy: Secondary | ICD-10-CM | POA: Insufficient documentation

## 2020-09-10 ENCOUNTER — Encounter: Payer: Self-pay | Admitting: Internal Medicine

## 2020-09-10 NOTE — Telephone Encounter (Signed)
Sent to Dr. John. 

## 2020-09-29 ENCOUNTER — Other Ambulatory Visit: Payer: Self-pay | Admitting: Internal Medicine

## 2020-10-14 ENCOUNTER — Other Ambulatory Visit: Payer: Self-pay | Admitting: Internal Medicine

## 2020-10-14 NOTE — Telephone Encounter (Signed)
Done erx 

## 2020-10-20 ENCOUNTER — Encounter: Payer: Self-pay | Admitting: Internal Medicine

## 2020-12-09 ENCOUNTER — Encounter: Payer: Self-pay | Admitting: Internal Medicine

## 2020-12-09 ENCOUNTER — Other Ambulatory Visit: Payer: Self-pay

## 2020-12-09 ENCOUNTER — Ambulatory Visit (INDEPENDENT_AMBULATORY_CARE_PROVIDER_SITE_OTHER): Payer: BC Managed Care – PPO | Admitting: Internal Medicine

## 2020-12-09 ENCOUNTER — Other Ambulatory Visit: Payer: Self-pay | Admitting: Internal Medicine

## 2020-12-09 VITALS — BP 120/70 | HR 95 | Temp 98.7°F | Ht 65.0 in | Wt 177.0 lb

## 2020-12-09 DIAGNOSIS — E559 Vitamin D deficiency, unspecified: Secondary | ICD-10-CM

## 2020-12-09 DIAGNOSIS — R1011 Right upper quadrant pain: Secondary | ICD-10-CM | POA: Diagnosis not present

## 2020-12-09 DIAGNOSIS — E785 Hyperlipidemia, unspecified: Secondary | ICD-10-CM | POA: Diagnosis not present

## 2020-12-09 DIAGNOSIS — E1165 Type 2 diabetes mellitus with hyperglycemia: Secondary | ICD-10-CM | POA: Diagnosis not present

## 2020-12-09 DIAGNOSIS — F411 Generalized anxiety disorder: Secondary | ICD-10-CM

## 2020-12-09 DIAGNOSIS — R030 Elevated blood-pressure reading, without diagnosis of hypertension: Secondary | ICD-10-CM

## 2020-12-09 DIAGNOSIS — R1084 Generalized abdominal pain: Secondary | ICD-10-CM | POA: Diagnosis not present

## 2020-12-09 LAB — URINALYSIS, ROUTINE W REFLEX MICROSCOPIC
Bilirubin Urine: NEGATIVE
Hgb urine dipstick: NEGATIVE
Ketones, ur: NEGATIVE
Leukocytes,Ua: NEGATIVE
Nitrite: NEGATIVE
RBC / HPF: NONE SEEN (ref 0–?)
Specific Gravity, Urine: 1.025 (ref 1.000–1.030)
Total Protein, Urine: NEGATIVE
Urine Glucose: 100 — AB
Urobilinogen, UA: 0.2 (ref 0.0–1.0)
WBC, UA: NONE SEEN (ref 0–?)
pH: 6 (ref 5.0–8.0)

## 2020-12-09 LAB — CBC WITH DIFFERENTIAL/PLATELET
Basophils Absolute: 0.1 10*3/uL (ref 0.0–0.1)
Basophils Relative: 1.2 % (ref 0.0–3.0)
Eosinophils Absolute: 0.3 10*3/uL (ref 0.0–0.7)
Eosinophils Relative: 3.4 % (ref 0.0–5.0)
HCT: 45.2 % (ref 39.0–52.0)
Hemoglobin: 15.4 g/dL (ref 13.0–17.0)
Lymphocytes Relative: 15.9 % (ref 12.0–46.0)
Lymphs Abs: 1.4 10*3/uL (ref 0.7–4.0)
MCHC: 34 g/dL (ref 30.0–36.0)
MCV: 87.8 fl (ref 78.0–100.0)
Monocytes Absolute: 0.5 10*3/uL (ref 0.1–1.0)
Monocytes Relative: 6.2 % (ref 3.0–12.0)
Neutro Abs: 6.3 10*3/uL (ref 1.4–7.7)
Neutrophils Relative %: 73.3 % (ref 43.0–77.0)
Platelets: 298 10*3/uL (ref 150.0–400.0)
RBC: 5.15 Mil/uL (ref 4.22–5.81)
RDW: 13 % (ref 11.5–15.5)
WBC: 8.6 10*3/uL (ref 4.0–10.5)

## 2020-12-09 LAB — HEPATIC FUNCTION PANEL
ALT: 39 U/L (ref 0–53)
AST: 26 U/L (ref 0–37)
Albumin: 4.6 g/dL (ref 3.5–5.2)
Alkaline Phosphatase: 83 U/L (ref 39–117)
Bilirubin, Direct: 0.1 mg/dL (ref 0.0–0.3)
Total Bilirubin: 0.3 mg/dL (ref 0.2–1.2)
Total Protein: 7 g/dL (ref 6.0–8.3)

## 2020-12-09 LAB — BASIC METABOLIC PANEL
BUN: 17 mg/dL (ref 6–23)
CO2: 29 mEq/L (ref 19–32)
Calcium: 9.4 mg/dL (ref 8.4–10.5)
Chloride: 102 mEq/L (ref 96–112)
Creatinine, Ser: 1.29 mg/dL (ref 0.40–1.50)
GFR: 60.59 mL/min (ref >60.00)
Glucose, Bld: 149 mg/dL — ABNORMAL HIGH (ref 70–99)
Potassium: 4 mEq/L (ref 3.5–5.1)
Sodium: 139 mEq/L (ref 135–145)

## 2020-12-09 LAB — HEMOGLOBIN A1C: Hgb A1c MFr Bld: 7.2 % — ABNORMAL HIGH (ref 4.6–6.5)

## 2020-12-09 LAB — LIPASE: Lipase: 43 U/L (ref 11.0–59.0)

## 2020-12-09 MED ORDER — GLIPIZIDE ER 5 MG PO TB24: 5.0000 mg | ORAL_TABLET | Freq: Every day | ORAL | 3 refills | Status: DC

## 2020-12-09 NOTE — Progress Notes (Deleted)
   Subjective:    Patient ID: [name] Gross, male    DOB: January 12, 1961, 59 y.o.   MRN: [id number]  HPI    Wt Readings from Last 3 Encounters:  12/09/20 177 lb (80.3 kg)  07/22/20 172 lb (78 kg)  02/12/20 178 lb 3.2 oz (80.8 kg)      Review of Systems     Objective:   Physical Exam   Lab Results  Component Value Date   HGBA1C 7.6 (H) 07/22/2020        Assessment & Plan:

## 2020-12-09 NOTE — Patient Instructions (Signed)
Please continue all other medications as before, and refills have been done if requested.  Please have the pharmacy call with any other refills you may need.  Please continue your efforts at being more active, low cholesterol diet, and weight control  Please keep your appointments with your specialists as you may have planned  You will be contacted regarding the referral for: ultrasound  Please go to the LAB at the blood drawing area for the tests to be done  You will be contacted by phone if any changes need to be made immediately.  Otherwise, you will receive a letter about your results with an explanation, but please check with MyChart first.  Please remember to sign up for MyChart if you have not done so, as this will be important to you in the future with finding out test results, communicating by private email, and scheduling acute appointments online when needed.

## 2020-12-11 ENCOUNTER — Other Ambulatory Visit: Payer: Self-pay

## 2020-12-11 ENCOUNTER — Ambulatory Visit
Admission: RE | Admit: 2020-12-11 | Discharge: 2020-12-11 | Disposition: A | Discharge status: Home / Self Care | Payer: BC Managed Care – PPO | Source: Ambulatory Visit | Attending: Internal Medicine | Admitting: Internal Medicine

## 2020-12-11 DIAGNOSIS — R1011 Right upper quadrant pain: Secondary | ICD-10-CM | POA: Insufficient documentation

## 2020-12-14 ENCOUNTER — Encounter: Payer: Self-pay | Admitting: Internal Medicine

## 2020-12-16 ENCOUNTER — Encounter: Payer: Self-pay | Admitting: Internal Medicine

## 2020-12-16 NOTE — Assessment & Plan Note (Signed)
Lab Results  Component Value Date   HGBA1C 7.2 (H) 12/09/2020   stable overall by history and exam, recent data reviewed with pt, and pt to continue medical treatment as before,  to f/u any worsening symptoms or concerns

## 2020-12-16 NOTE — Progress Notes (Signed)
Established Patient Office Visit  Subjective:  Patient ID: [name] Gross, male    DOB: 11/12/1961  Age: 59 y.o. MRN: [id number]      Chief Complaint: (concise statement describing the symptom, problem, condition, diagnosis, physician recommended return, or other factor as reason for encounter) follow up elevated bp, HLD and hyperglycemia, ruq pain, anxiety, vit d deficiency       HPI:  [name] Gross is a 59 y.o. male here to f/u; overall doing ok,  Pt denies chest pain, increasing sob or doe, wheezing, orthopnea, PND, increased LE swelling, palpitations, dizziness or syncope.  Pt denies new neurological symptoms such as new headache, or facial or extremity weakness or numbness.  Pt denies polydipsia, polyuria.        Wt Readings from Last 3 Encounters:  12/09/20 177 lb (80.3 kg)  07/22/20 172 lb (78 kg)  02/12/20 178 lb 3.2 oz (80.8 kg)   BP Readings from Last 3 Encounters:  12/09/20 120/70  07/22/20 126/70  02/12/20 130/84        Also c/o acute problem of 2.5 wks onset mild intermittent bilateral lower more than mid low abd shooting pains, Denies urinary symptoms such as dysuria, frequency, urgency, flank pain, hematuria or n/v, fever, chills.  Denies worsening reflux, dysphagia, n/v, bowel change or blood. He is concerned that his prostate Ca is returning and what to do about it after his last visit with urology showed psa slightly increased again.  No recent CT or u/s  UA neg last wk with urology per pt.  Also has some increased numbness to toes bilateral, wondering if related, but no pain or LE weakness.  Denies worsening depressive symptoms, suicidal ideation, or panic  Past Medical History:  Diagnosis Date  . Allergic rhinitis   . Anxiety   . Asthma    mild intermittent  . CAD (coronary artery disease)    s/p INF STEMI 12/04/2010: treated with BMS to RCA c/b acute stent thrombosis at completion of  primary PCI ...above tx with second BMS with adjunctive thrombectomy residual CAD at  cath 12/04/2010: pRCA 40-50%; LAD and CFX ok; EF 45% Echo 12/06/2010: Ef 55-65%; ?HK inferior wall; mild MR  . Erectile dysfunction   . GERD (gastroesophageal reflux disease)   . Glucose intolerance (impaired glucose tolerance)   . HTN (hypertension)   . Obesity   . OSA (obstructive sleep apnea)    mild, no CPAP per pt preference  . Osteoarthritis   . Prostate cancer [facility])    Past Surgical History:  Procedure Laterality Date  . CORONARY STENT PLACEMENT     Stenting of the right coronary artery......... Successful percutaneous coronary intervention with  adjunctive thrombectomy for treatment of acute stent thrombosis and   reocclusion of the right coronary artery after treatment earlier today  of an acute myocardial infarction  . ELBOW SURGERY    . KNEE SURGERY    . NASAL SINUS SURGERY    . SPERMATOCELECTOMY    . WRIST SURGERY      reports that he quit smoking about 38 years ago. His smoking use included cigarettes. He has a 7.50 pack-year smoking history. He has never used smokeless tobacco. He reports that he does not drink alcohol and does not use drugs. family history includes Breast cancer in his maternal aunt; Cancer in his maternal aunt; Colon cancer in his maternal aunt; Coronary artery disease in his father; Diabetes in his father; Esophageal cancer in his mother; Heart disease in  his father, maternal aunt, maternal grandfather, maternal grandmother, maternal uncle, and mother; Hip fracture in his father; Liver cancer in his maternal uncle; Prostate cancer in his brother. Allergies  Allergen Reactions  . Amoxicillin     Per the pt "it was years ago and does not remember the reaction"   Current Outpatient Medications on File Prior to Visit  Medication Sig Dispense Refill  . ASPIRIN LOW DOSE 81 MG EC tablet TAKE 1 TABLET BY MOUTH EVERY DAY 90 tablet 3  . escitalopram (LEXAPRO) 10 MG tablet TAKE 1 TABLET BY MOUTH EVERY DAY 90 tablet 1  . Magnesium 500 MG CAPS Take 1 capsule by  mouth daily.     . metFORMIN (GLUCOPHAGE-XR) 500 MG 24 hr tablet Take 4 tabs by mouth in the AM 360 tablet 3  . nitroGLYCERIN (NITROSTAT) 0.4 MG SL tablet Place 1 tablet (0.4 mg total) under the tongue every 5 (five) minutes as needed for chest pain. 25 tablet 3  . rosuvastatin (CRESTOR) 5 MG tablet TAKE 1 TABLET BY MOUTH EVERY DAY AT 6PM 90 tablet 3  . traMADol (ULTRAM) 50 MG tablet TAKE 1 TABLET BY MOUTH TWICE A DAY AS NEEDED FOR PAIN 60 tablet 2   No current facility-administered medications on file prior to visit.        ROS:  All others reviewed and negative.  Objective        PE:  BP 120/70 (BP Location: Left Arm, Patient Position: Sitting, Cuff Size: Large)   Pulse 95   Temp 98.7 F (37.1 C) (Oral)   Ht 5\' 5"  (1.651 m)   Wt 177 lb (80.3 kg)   SpO2 94%   BMI 29.45 kg/m                 Constitutional: Pt appears in NAD               HENT: Head: NCAT.                Right Ear: External ear normal.                 Left Ear: External ear normal.                Eyes: . Pupils are equal, round, and reactive to light. Conjunctivae and EOM are normal               Nose: without d/c or deformity               Neck: Neck supple. Gross normal ROM               Cardiovascular: Normal rate and regular rhythm.                 Pulmonary/Chest: Effort normal and breath sounds without rales or wheezing.                Abd:  Soft, mild ruq tender, ND, + BS, no organomegaly, no guarding or rebound o/w benign               Neurological: Pt is alert. At baseline orientation, motor grossly intact               Skin: Skin is warm. No rashes, no other new lesions, LE edema - none               Psychiatric: Pt behavior is normal without agitation   Assessment/Plan:  [name] Gross is a  59 y.o. White or Caucasian [1] male with  has a past medical history of Allergic rhinitis, Anxiety, Asthma, CAD (coronary artery disease), Erectile dysfunction, GERD (gastroesophageal reflux disease), Glucose  intolerance (impaired glucose tolerance), HTN (hypertension), Obesity, OSA (obstructive sleep apnea), Osteoarthritis, and Prostate cancer ([place]).   Assessment Plan  See problem oriented assessment and plan Labs reviewed for each problem: Lab Results  Component Value Date   WBC 8.6 12/09/2020   HGB 15.4 12/09/2020   HCT 45.2 12/09/2020   PLT 298.0 12/09/2020   GLUCOSE 149 (H) 12/09/2020   CHOL 156 07/22/2020   TRIG 310 (H) 07/22/2020   HDL 54 07/22/2020   LDLDIRECT 55.0 02/12/2020   LDLCALC 65 07/22/2020   ALT 39 12/09/2020   AST 26 12/09/2020   NA 139 12/09/2020   K 4.0 12/09/2020   CL 102 12/09/2020   CREATININE 1.29 12/09/2020   BUN 17 12/09/2020   CO2 29 12/09/2020   TSH 2.82 07/22/2020   PSA <0.1 07/22/2020   INR 1.00 12/22/2010   HGBA1C 7.2 (H) 12/09/2020   MICROALBUR 0.4 07/22/2020    Micro: none  Cardiac tracings I have personally interpreted today:  none  Pertinent Radiological findings (summarize): none   I spent total 38 minutes in caring for the patient for this visit:  1) by communicating with the patient during the visit  2) by review of pertinent vital sign data, physical examination and labs as documented in the assessment and plan  3) by review of pertinent imaging - none today  4) by review of pertinent procedures - none today  5) by obtaining and reviewing separately obtained information from family/caretaker and Care Everywhere - none today  6) by ordering medications  7) by ordering tests  8) by documenting all of this clinical information in the EHR including the management of each problem noted today in assessment and plan  Health Maintenance Due  Topic Date Due  . INFLUENZA VACCINE  07/20/2020  . FOOT EXAM  08/06/2020  . OPHTHALMOLOGY EXAM  10/21/2020    There are no preventive care reminders to display for this patient.  Problem List Items Addressed This Visit      High   RUQ pain - Primary    ? Etiology, for abd u/s and  LFTs, pt noted is s/p ccx      Relevant Orders   US Abdomen Complete (Completed)   Hepatic function panel (Completed)   CBC with Differential/Platelet (Completed)   Basic metabolic panel (Completed)   Urinalysis, Routine w reflex microscopic (Completed)   Lipase (Completed)     Medium   Vitamin D deficiency    Last vitamin D Lab Results  Component Value Date   VD25OH 33 07/22/2020  cont oral replacement      Elevated blood pressure reading without diagnosis of hypertension    BP Readings from Last 3 Encounters:  12/09/20 120/70  07/22/20 126/70  02/12/20 130/84  stable overall by history and exam, recent data reviewed with pt, and pt to continue medical treatment as before,  to f/u any worsening symptoms or concerns       Dyslipidemia    Lab Results  Component Value Date   LDLCALC 65 07/22/2020  stable overall by history and exam, recent data reviewed with pt, and pt to continue medical treatment as before,  to f/u any worsening symptoms or concerns       Diabetes [facility])    Lab Results  Component Value Date  HGBA1C 7.2 (H) 12/09/2020   stable overall by history and exam, recent data reviewed with pt, and pt to continue medical treatment as before,  to f/u any worsening symptoms or concerns       Relevant Orders   Hemoglobin A1c (Completed)   Anxiety state    Overall stable overall by history and exam, recent data reviewed with pt, and pt to continue medical treatment as before,  to f/u any worsening symptoms or concerns      Abdominal pain    Exam ow benign, for u/s as above, o/w continue to follow; pt reassured that his abd shooting pains would not represent prostate ca returning as this would not cause pain until far advanced         No orders of the defined types were placed in this encounter.   Follow-up: 6 mo   Cathlean Cower, MD 12/16/2020 6:16 PM [facility]

## 2020-12-16 NOTE — Assessment & Plan Note (Signed)
Exam ow benign, for u/s as above, o/w continue to follow; pt reassured that his abd shooting pains would not represent prostate ca returning as this would not cause pain until far advanced

## 2020-12-16 NOTE — Assessment & Plan Note (Signed)
Last vitamin D Lab Results  Component Value Date   VD25OH 33 07/22/2020  cont oral replacement

## 2020-12-16 NOTE — Assessment & Plan Note (Signed)
Overall stable overall by history and exam, recent data reviewed with pt, and pt to continue medical treatment as before,  to f/u any worsening symptoms or concerns  

## 2020-12-16 NOTE — Assessment & Plan Note (Signed)
?   Etiology, for abd u/s and LFTs, pt noted is s/p ccx

## 2020-12-16 NOTE — Assessment & Plan Note (Signed)
Lab Results  Component Value Date   LDLCALC 65 07/22/2020  stable overall by history and exam, recent data reviewed with pt, and pt to continue medical treatment as before,  to f/u any worsening symptoms or concerns

## 2020-12-16 NOTE — Assessment & Plan Note (Signed)
BP Readings from Last 3 Encounters:  12/09/20 120/70  07/22/20 126/70  02/12/20 130/84  stable overall by history and exam, recent data reviewed with pt, and pt to continue medical treatment as before,  to f/u any worsening symptoms or concerns

## 2021-01-09 ENCOUNTER — Ambulatory Visit: Payer: BC Managed Care – PPO | Admitting: Cardiovascular Disease

## 2021-01-09 ENCOUNTER — Other Ambulatory Visit: Payer: Self-pay

## 2021-01-09 ENCOUNTER — Encounter: Payer: Self-pay | Admitting: Cardiovascular Disease

## 2021-01-09 VITALS — BP 128/78 | HR 82 | Ht 65.0 in | Wt 178.0 lb

## 2021-01-09 DIAGNOSIS — I10 Essential (primary) hypertension: Secondary | ICD-10-CM

## 2021-01-09 DIAGNOSIS — E782 Mixed hyperlipidemia: Secondary | ICD-10-CM

## 2021-01-09 DIAGNOSIS — I2510 Atherosclerotic heart disease of native coronary artery without angina pectoris: Secondary | ICD-10-CM | POA: Diagnosis not present

## 2021-01-09 NOTE — Patient Instructions (Signed)

## 2021-01-09 NOTE — Progress Notes (Signed)
Cardiology Office Note:    Date:  01/09/2021   ID:  [name] Gross, DOB April 01, 1961, MRN [id number]  PCP:  Biagio Borg, MD  [facility] Cardiologist:  Sherren Mocha, MD  [facility] Electrophysiologist:  None   Referring MD: Biagio Borg, MD   Chief Complaint  Patient presents with   Coronary Artery Disease    History of Present Illness:    [name] Gross is a 60 y.o. male with a hx of coronary artery disease, prostate cancer, hypertension, and mixed hyperlipidemia.  The patient initially presented in 2011 with an acute inferior wall MI treated with bare-metal stenting of the right coronary artery.  His course was complicated by acute stent thrombosis with need for placement of a second stent.  He has done well in the interim with no interval ischemic events and preserved LVEF of 55 to 65%.  The patient underwent total prostatectomy in 2008 and required radiation therapy for increasing PSA in 2019.  The patient is here alone today. 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 He plans on retiring in April of this year. Today, he denies symptoms of palpitations, chest pain, shortness of breath, orthopnea, PND, lower extremity edema, dizziness, or syncope.   Past Medical History:  Diagnosis Date   Allergic rhinitis    Anxiety    Asthma    mild intermittent   CAD (coronary artery disease)    s/p INF STEMI 12/04/2010: treated with BMS to RCA c/b acute stent thrombosis at completion of  primary PCI ...above tx with second BMS with adjunctive thrombectomy residual CAD at cath 12/04/2010: pRCA 40-50%; LAD and CFX ok; EF 45% Echo 12/06/2010: Ef 55-65%; ?HK inferior wall; mild MR   Erectile dysfunction    GERD (gastroesophageal reflux disease)    Glucose intolerance (impaired glucose tolerance)    HTN (hypertension)    Obesity    OSA (obstructive sleep apnea)    mild, no CPAP per pt preference   Osteoarthritis    Prostate cancer [facility])     Past Surgical  History:  Procedure Laterality Date   CORONARY STENT PLACEMENT     Stenting of the right coronary artery......... Successful percutaneous coronary intervention with  adjunctive thrombectomy for treatment of acute stent thrombosis and   reocclusion of the right coronary artery after treatment earlier today  of an acute myocardial infarction   ELBOW SURGERY     KNEE SURGERY     NASAL SINUS SURGERY     SPERMATOCELECTOMY     WRIST SURGERY      Current Medications: Current Meds  Medication Sig   ASPIRIN LOW DOSE 81 MG EC tablet TAKE 1 TABLET BY MOUTH EVERY DAY   escitalopram (LEXAPRO) 10 MG tablet TAKE 1 TABLET BY MOUTH EVERY DAY   glipiZIDE (GLUCOTROL XL) 5 MG 24 hr tablet Take 1 tablet (5 mg total) by mouth daily with breakfast.   Magnesium 500 MG CAPS Take 1 capsule by mouth daily.    metFORMIN (GLUCOPHAGE-XR) 500 MG 24 hr tablet Take 500 mg by mouth daily with breakfast.   nitroGLYCERIN (NITROSTAT) 0.4 MG SL tablet Place 1 tablet (0.4 mg total) under the tongue every 5 (five) minutes as needed for chest pain.   rosuvastatin (CRESTOR) 5 MG tablet TAKE 1 TABLET BY MOUTH EVERY DAY AT 6PM   traMADol (ULTRAM) 50 MG tablet TAKE 1 TABLET BY MOUTH TWICE A DAY AS NEEDED FOR PAIN     Allergies:   Amoxicillin  Social History   Socioeconomic History   Marital status: Married    Spouse name: Not on file   Number of children: 2   Years of education: Not on file   Highest education level: Not on file  Occupational History    Comment: full time  Tobacco Use   Smoking status: Former Smoker    Packs/day: 0.50    Years: 15.00    Pack years: 7.50    Types: Cigarettes    Quit date: 10/20/1982    Years since quitting: 38.2   Smokeless tobacco: Never Used  Vaping Use   Vaping Use: Never used  Substance and Sexual Activity   Alcohol use: No   Drug use: No   Sexual activity: Yes  Other Topics Concern   Not on file  Social History Narrative   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 Resides in [place] with wife. Has one son and one daughter.   Social Determinants of Health   Financial Resource Strain: Not on file  Food Insecurity: Not on file  Transportation Needs: Not on file  Physical Activity: Not on file  Stress: Not on file  Social Connections: Not on file     Family History: The patient's family history includes Breast cancer in his maternal aunt; Cancer in his maternal aunt; Colon cancer in his maternal aunt; Coronary artery disease in his father; Diabetes in his father; Esophageal cancer in his mother; Heart disease in his father, maternal aunt, maternal grandfather, maternal grandmother, maternal uncle, and mother; Hip fracture in his father; Liver cancer in his maternal uncle; Prostate cancer in his brother. There is no history of Rectal cancer or Stomach cancer.  ROS:   Please see the history of present illness.    All other systems reviewed and are negative.  EKGs/Labs/Other Studies Reviewed:    EKG:  EKG is ordered today.  The ekg ordered today demonstrates NSR 89 bpm, age-indeterminate inferior infarct. No change from previous.   Recent Labs: 07/22/2020: TSH 2.82 12/09/2020: ALT 39; BUN 17; Creatinine, Ser 1.29; Hemoglobin 15.4; Platelets 298.0; Potassium 4.0; Sodium 139  Recent Lipid Panel    Component Value Date/Time   CHOL 156 07/22/2020 1112   TRIG 310 (H) 07/22/2020 1112   TRIG 241 (HH) 10/31/2006 0917   HDL 54 07/22/2020 1112   CHOLHDL 2.9 07/22/2020 1112   VLDL 53.2 (H) 02/12/2020 1201   LDLCALC 65 07/22/2020 1112   LDLDIRECT 55.0 02/12/2020 1201     Risk Assessment/Calculations:       Physical Exam:    VS:  BP 128/78    Pulse 82    Ht 5\' 5"  (1.651 m)    Wt 178 lb (80.7 kg)    SpO2 98%    BMI 29.62 kg/m     Wt Readings from Last 3 Encounters:  01/09/21 178 lb (80.7 kg)  12/09/20 177 lb (80.3 kg)  07/22/20 172 lb (78 kg)     GEN:  Well nourished, well developed in no acute distress HEENT: Normal NECK: No  JVD; No carotid bruits LYMPHATICS: No lymphadenopathy CARDIAC: RRR, no murmurs, rubs, gallops RESPIRATORY:  Clear to auscultation without rales, wheezing or rhonchi  ABDOMEN: Soft, non-tender, non-distended MUSCULOSKELETAL:  No edema; No deformity  SKIN: Warm and dry NEUROLOGIC:  Alert and oriented x 3 PSYCHIATRIC:  Normal affect   ASSESSMENT:    1. Coronary artery disease involving native coronary artery of native heart without angina pectoris   2. Essential hypertension   3. Mixed hyperlipidemia  PLAN:    In order of problems listed above:  1. The patient is doing well with no symptoms at present. He continues on aspirin for antiplatelet therapy and a low-dose statin drug as tolerated. He will return in 1 year for follow-up evaluation. 2. Normotensive, has not required any therapy. Has not been inclined to take any additional medication. 3. Lipids are excellent on a low-dose of rosuvastatin. LDL cholesterol is 65. We discussed diet and lifestyle modification. Triglycerides remain elevated. He continues to eat out a lot.  Medication Adjustments/Labs and Tests Ordered: Current medicines are reviewed at length with the patient today.  Concerns regarding medicines are outlined above.  No orders of the defined types were placed in this encounter.  No orders of the defined types were placed in this encounter.   There are no Patient Instructions on file for this visit.   Signed, Sherren Mocha, MD  01/09/2021 2:34 PM    [place]

## 2021-01-22 ENCOUNTER — Other Ambulatory Visit: Payer: Self-pay

## 2021-01-23 ENCOUNTER — Encounter: Payer: Self-pay | Admitting: Internal Medicine

## 2021-01-23 ENCOUNTER — Other Ambulatory Visit: Payer: Self-pay

## 2021-01-23 ENCOUNTER — Ambulatory Visit (INDEPENDENT_AMBULATORY_CARE_PROVIDER_SITE_OTHER): Payer: BC Managed Care – PPO | Admitting: Internal Medicine

## 2021-01-23 ENCOUNTER — Other Ambulatory Visit: Payer: Self-pay | Admitting: Internal Medicine

## 2021-01-23 VITALS — BP 124/78 | HR 80 | Temp 98.6°F | Ht 65.0 in | Wt 175.0 lb

## 2021-01-23 DIAGNOSIS — E785 Hyperlipidemia, unspecified: Secondary | ICD-10-CM

## 2021-01-23 DIAGNOSIS — R030 Elevated blood-pressure reading, without diagnosis of hypertension: Secondary | ICD-10-CM | POA: Diagnosis not present

## 2021-01-23 DIAGNOSIS — E1165 Type 2 diabetes mellitus with hyperglycemia: Secondary | ICD-10-CM | POA: Diagnosis not present

## 2021-01-23 DIAGNOSIS — E559 Vitamin D deficiency, unspecified: Secondary | ICD-10-CM | POA: Diagnosis not present

## 2021-01-23 LAB — BASIC METABOLIC PANEL
BUN: 16 mg/dL (ref 6–23)
CO2: 31 mEq/L (ref 19–32)
Calcium: 9.7 mg/dL (ref 8.4–10.5)
Chloride: 103 mEq/L (ref 96–112)
Creatinine, Ser: 1.33 mg/dL (ref 0.40–1.50)
GFR: 58.36 mL/min — ABNORMAL LOW (ref >60.00)
Glucose, Bld: 156 mg/dL — ABNORMAL HIGH (ref 70–99)
Potassium: 4 mEq/L (ref 3.5–5.1)
Sodium: 139 mEq/L (ref 135–145)

## 2021-01-23 LAB — LIPID PANEL
Cholesterol: 148 mg/dL (ref 0–200)
HDL: 52.2 mg/dL (ref >39.00)
NonHDL: 95.42
Total CHOL/HDL Ratio: 3
Triglycerides: 215 mg/dL — ABNORMAL HIGH (ref 0.0–149.0)
VLDL: 43 mg/dL — ABNORMAL HIGH (ref 0.0–40.0)

## 2021-01-23 LAB — HEMOGLOBIN A1C: Hgb A1c MFr Bld: 7.4 % — ABNORMAL HIGH (ref 4.6–6.5)

## 2021-01-23 LAB — HEPATIC FUNCTION PANEL
ALT: 34 U/L (ref 0–53)
AST: 21 U/L (ref 0–37)
Albumin: 4.3 g/dL (ref 3.5–5.2)
Alkaline Phosphatase: 75 U/L (ref 39–117)
Bilirubin, Direct: 0.1 mg/dL (ref 0.0–0.3)
Total Bilirubin: 0.4 mg/dL (ref 0.2–1.2)
Total Protein: 6.9 g/dL (ref 6.0–8.3)

## 2021-01-23 LAB — LDL CHOLESTEROL, DIRECT: Direct LDL: 65 mg/dL

## 2021-01-23 MED ORDER — METFORMIN HCL ER 500 MG PO TB24: 1000.0000 mg | ORAL_TABLET | Freq: Every day | ORAL | 3 refills | Status: DC

## 2021-01-23 NOTE — Patient Instructions (Signed)

## 2021-01-23 NOTE — Progress Notes (Signed)
Established Patient Office Visit  Subjective:  Patient ID: [name] Gross, male    DOB: 11-20-1961  Age: 60 y.o. MRN: [id number]   [name] Gross has random fleeting pains he believes now may have due to stress reaction and worry about nearing retirement, plans to retire next month.  Has eye doctor appt later this year.         Chief Complaint: follow up vit d def, HLD and DM, and hx of elevated BP       HPI:  [name] Gross is a 60 y.o. male here overall doing well, has no specific complaints.  cbgs in 100s, and  Pt denies polydipsia, polyuria, or low sugar symptoms such as weakness or confusion improved with po intake.  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  Plans to do better when retires soon.  Pt denies chest pain, increased sob or doe, wheezing, orthopnea, PND, increased LE swelling, palpitations, dizziness or syncope.   Pt denies new neurological symptoms such as new headache, or facial or extremity weakness or numbness      Still has random abd fleeting pains he believes now may have due to stress reaction and worry about nearing retirement, plans to retire next month.  Has eye doctor appt later this year.    Wt Readings from Last 3 Encounters:  01/23/21 175 lb (79.4 kg)  01/09/21 178 lb (80.7 kg)  12/09/20 177 lb (80.3 kg)   BP Readings from Last 3 Encounters:  01/23/21 124/78  01/09/21 128/78  12/09/20 120/70         Past Medical History:  Diagnosis Date  . Allergic rhinitis   . Anxiety   . Asthma    mild intermittent  . CAD (coronary artery disease)    s/p INF STEMI 12/04/2010: treated with BMS to RCA c/b acute stent thrombosis at completion of  primary PCI ...above tx with second BMS with adjunctive thrombectomy residual CAD at cath 12/04/2010: pRCA 40-50%; LAD and CFX ok; EF 45% Echo 12/06/2010: Ef 55-65%; ?HK inferior wall; mild MR  .  Erectile dysfunction   . GERD (gastroesophageal reflux disease)   . Glucose intolerance (impaired glucose tolerance)   . HTN (hypertension)   . Obesity   . OSA (obstructive sleep apnea)    mild, no CPAP per pt preference  . Osteoarthritis   . Prostate cancer [facility])    Past Surgical History:  Procedure Laterality Date  . CORONARY STENT PLACEMENT     Stenting of the right coronary artery......... Successful percutaneous coronary intervention with  adjunctive thrombectomy for treatment of acute stent thrombosis and   reocclusion of the right coronary artery after treatment earlier today  of an acute myocardial infarction  . ELBOW SURGERY    . KNEE SURGERY    . NASAL SINUS SURGERY    . SPERMATOCELECTOMY    . WRIST SURGERY      reports that he quit smoking about 38 years ago. His smoking use included cigarettes. He has a 7.50 pack-year smoking history. He has never used smokeless tobacco. He reports that he does not drink alcohol and does not use drugs. family history includes Breast cancer in his maternal aunt; Cancer in his maternal aunt; Colon cancer in his maternal aunt; Coronary artery disease in his father; Diabetes in his father; Esophageal cancer in his mother; Heart disease in his father, maternal  aunt, maternal grandfather, maternal grandmother, maternal uncle, and mother; Hip fracture in his father; Liver cancer in his maternal uncle; Prostate cancer in his brother. Allergies  Allergen Reactions  . Amoxicillin     Per the pt "it was years ago and does not remember the reaction"   Current Outpatient Medications on File Prior to Visit  Medication Sig Dispense Refill  . ASPIRIN LOW DOSE 81 MG EC tablet TAKE 1 TABLET BY MOUTH EVERY DAY 90 tablet 3  . escitalopram (LEXAPRO) 10 MG tablet TAKE 1 TABLET BY MOUTH EVERY DAY 90 tablet 1  . glipiZIDE (GLUCOTROL XL) 5 MG 24 hr tablet Take 1 tablet (5 mg total) by mouth daily with breakfast. 90 tablet 3  . Magnesium 500 MG CAPS Take 1 capsule  by mouth daily.     . nitroGLYCERIN (NITROSTAT) 0.4 MG SL tablet Place 1 tablet (0.4 mg total) under the tongue every 5 (five) minutes as needed for chest pain. 25 tablet 3  . rosuvastatin (CRESTOR) 5 MG tablet TAKE 1 TABLET BY MOUTH EVERY DAY AT 6PM 90 tablet 3  . traMADol (ULTRAM) 50 MG tablet TAKE 1 TABLET BY MOUTH TWICE A DAY AS NEEDED FOR PAIN 60 tablet 2   No current facility-administered medications on file prior to visit.        ROS:  All others reviewed and negative.  Objective        PE:  BP 124/78   Pulse 80   Temp 98.6 F (37 C) (Oral)   Ht 5\' 5"  (1.651 m)   Wt 175 lb (79.4 kg)   SpO2 96%   BMI 29.12 kg/m                 Constitutional: Pt appears in NAD               HENT: Head: NCAT.                Right Ear: External ear normal.                 Left Ear: External ear normal.                Eyes: . Pupils are equal, round, and reactive to light. Conjunctivae and EOM are normal               Nose: without d/c or deformity               Neck: Neck supple. Gross normal ROM               Cardiovascular: Normal rate and regular rhythm.                 Pulmonary/Chest: Effort normal and breath sounds without rales or wheezing.                Abd:  Soft, NT, ND, + BS, no organomegaly               Neurological: Pt is alert. At baseline orientation, motor grossly intact               Skin: Skin is warm. No rashes, no other new lesions, LE edema - none               Psychiatric: Pt behavior is normal without agitation   Assessment/Plan:  [name] Gross is a 60 y.o. White or Caucasian [1] male with  has a past medical history of Allergic rhinitis,  Anxiety, Asthma, CAD (coronary artery disease), Erectile dysfunction, GERD (gastroesophageal reflux disease), Glucose intolerance (impaired glucose tolerance), HTN (hypertension), Obesity, OSA (obstructive sleep apnea), Osteoarthritis, and Prostate cancer ([place]).   Micro: none  Cardiac tracings I have personally interpreted today:   none  Pertinent Radiological findings (summarize): none   Lab Results  Component Value Date   WBC 8.6 12/09/2020   HGB 15.4 12/09/2020   HCT 45.2 12/09/2020   PLT 298.0 12/09/2020   GLUCOSE 156 (H) 01/23/2021   CHOL 148 01/23/2021   TRIG 215.0 (H) 01/23/2021   HDL 52.20 01/23/2021   LDLDIRECT 65.0 01/23/2021   LDLCALC 65 07/22/2020   ALT 34 01/23/2021   AST 21 01/23/2021   NA 139 01/23/2021   K 4.0 01/23/2021   CL 103 01/23/2021   CREATININE 1.33 01/23/2021   BUN 16 01/23/2021   CO2 31 01/23/2021   TSH 2.82 07/22/2020   PSA <0.1 07/22/2020   INR 1.00 12/22/2010   HGBA1C 7.4 (H) 01/23/2021   MICROALBUR 0.4 07/22/2020     Assessment & Plan:   Problem List Items Addressed This Visit      Medium   Vitamin D deficiency    Last vitamin D Lab Results  Component Value Date   VD25OH 33 07/22/2020   Stable, cont oral replacement       Elevated blood pressure reading without diagnosis of hypertension    BP Readings from Last 3 Encounters:  01/23/21 124/78  01/09/21 128/78  12/09/20 120/70   Stable, pt to continue medical treatment  - none, delines baby dose ace arb for renoprotection for now      Dyslipidemia    Lab Results  Component Value Date   LDLCALC 65 07/22/2020   Stable, pt to continue current statin crestor 5 mg       Diabetes ([place]) - Primary    Lab Results  Component Value Date   HGBA1C 7.4 (H) 01/23/2021   Stable, pt to continue current medical treatment glipizide, metformin  Current Outpatient Medications (Endocrine & Metabolic):  .  glipiZIDE (GLUCOTROL XL) 5 MG 24 hr tablet, Take 1 tablet (5 mg total) by mouth daily with breakfast. .  metFORMIN (GLUCOPHAGE-XR) 500 MG 24 hr tablet, Take 2 tablets (1,000 mg total) by mouth daily with breakfast.  Current Outpatient Medications (Cardiovascular):  .  nitroGLYCERIN (NITROSTAT) 0.4 MG SL tablet, Place 1 tablet (0.4 mg total) under the tongue every 5 (five) minutes as needed for chest pain. .   rosuvastatin (CRESTOR) 5 MG tablet, TAKE 1 TABLET BY MOUTH EVERY DAY AT 6PM   Current Outpatient Medications (Analgesics):  Marland Kitchen  ASPIRIN LOW DOSE 81 MG EC tablet, TAKE 1 TABLET BY MOUTH EVERY DAY .  traMADol (ULTRAM) 50 MG tablet, TAKE 1 TABLET BY MOUTH TWICE A DAY AS NEEDED FOR PAIN   Current Outpatient Medications (Other):  .  escitalopram (LEXAPRO) 10 MG tablet, TAKE 1 TABLET BY MOUTH EVERY DAY .  Magnesium 500 MG CAPS, Take 1 capsule by mouth daily.        Relevant Orders   Hemoglobin A1c (Completed)   Lipid panel (Completed)   Hepatic function panel (Completed)   Basic metabolic panel (Completed)   LDL cholesterol, direct (Completed)      No orders of the defined types were placed in this encounter.   Follow-up: Return in about 6 months (around 07/23/2021).   Cathlean Cower, MD 01/24/2021 5:49 PM [facility]

## 2021-01-24 ENCOUNTER — Encounter: Payer: Self-pay | Admitting: Internal Medicine

## 2021-01-24 NOTE — Assessment & Plan Note (Signed)
Lab Results  Component Value Date   LDLCALC 65 07/22/2020   Stable, pt to continue current statin crestor 5 mg

## 2021-01-24 NOTE — Assessment & Plan Note (Signed)
BP Readings from Last 3 Encounters:  01/23/21 124/78  01/09/21 128/78  12/09/20 120/70   Stable, pt to continue medical treatment  - none, delines baby dose ace arb for renoprotection for now

## 2021-01-24 NOTE — Assessment & Plan Note (Signed)
Last vitamin D Lab Results  Component Value Date   VD25OH 33 07/22/2020   Stable, cont oral replacement

## 2021-01-24 NOTE — Assessment & Plan Note (Signed)
Lab Results  Component Value Date   HGBA1C 7.4 (H) 01/23/2021   Stable, pt to continue current medical treatment glipizide, metformin  Current Outpatient Medications (Endocrine & Metabolic):  .  glipiZIDE (GLUCOTROL XL) 5 MG 24 hr tablet, Take 1 tablet (5 mg total) by mouth daily with breakfast. .  metFORMIN (GLUCOPHAGE-XR) 500 MG 24 hr tablet, Take 2 tablets (1,000 mg total) by mouth daily with breakfast.  Current Outpatient Medications (Cardiovascular):  .  nitroGLYCERIN (NITROSTAT) 0.4 MG SL tablet, Place 1 tablet (0.4 mg total) under the tongue every 5 (five) minutes as needed for chest pain. .  rosuvastatin (CRESTOR) 5 MG tablet, TAKE 1 TABLET BY MOUTH EVERY DAY AT 6PM   Current Outpatient Medications (Analgesics):  Marland Kitchen  ASPIRIN LOW DOSE 81 MG EC tablet, TAKE 1 TABLET BY MOUTH EVERY DAY .  traMADol (ULTRAM) 50 MG tablet, TAKE 1 TABLET BY MOUTH TWICE A DAY AS NEEDED FOR PAIN   Current Outpatient Medications (Other):  .  escitalopram (LEXAPRO) 10 MG tablet, TAKE 1 TABLET BY MOUTH EVERY DAY .  Magnesium 500 MG CAPS, Take 1 capsule by mouth daily.

## 2021-02-02 ENCOUNTER — Encounter: Payer: Self-pay | Admitting: Internal Medicine

## 2021-02-02 DIAGNOSIS — E1165 Type 2 diabetes mellitus with hyperglycemia: Secondary | ICD-10-CM

## 2021-02-02 DIAGNOSIS — E538 Deficiency of other specified B group vitamins: Secondary | ICD-10-CM

## 2021-02-02 DIAGNOSIS — E559 Vitamin D deficiency, unspecified: Secondary | ICD-10-CM

## 2021-02-02 DIAGNOSIS — C61 Malignant neoplasm of prostate: Secondary | ICD-10-CM

## 2021-02-02 NOTE — Telephone Encounter (Signed)
Labs ordered  Please staff to call pt - due for ROV in 6 mo  (not 1 yr)

## 2021-02-11 ENCOUNTER — Other Ambulatory Visit: Payer: Self-pay | Admitting: Cardiovascular Disease

## 2021-03-20 ENCOUNTER — Ambulatory Visit: Payer: BC Managed Care – PPO | Admitting: Cardiovascular Disease

## 2021-04-02 ENCOUNTER — Other Ambulatory Visit: Payer: Self-pay | Admitting: Cardiovascular Disease

## 2021-04-02 ENCOUNTER — Other Ambulatory Visit: Payer: Self-pay | Admitting: Internal Medicine

## 2021-04-06 ENCOUNTER — Other Ambulatory Visit: Payer: Self-pay | Admitting: Internal Medicine

## 2021-07-06 ENCOUNTER — Telehealth: Payer: Self-pay | Admitting: *Deleted

## 2021-07-06 NOTE — Telephone Encounter (Signed)
[name] Gross 60 year old male is requesting a left medial UKA.  He was last seen in the clinic on 01/09/2021.  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  He had planned on retiring in April 2022.  He denied shortness of breath, palpitations, chest pain, orthopnea PND, dizziness, lower extremity edema and syncope.   His PMH includes prostate cancer, HTN, mixed HLD and coronary artery disease.  He presented in 2011 with acute inferior MI treated with BMS to his RCA.  His course was complicated by acute stent thrombosis and he required placement of a second stent.  Patient required no further cardiac interventions.  His EF is 55-65%.  He underwent total prostatectomy in 2008.  May his aspirin be held prior to his surgery?  Thank you for your help.  Please direct your response to CV DIV preop will.  [name] Gross. [name] Sensing NP-C    07/06/2021, 1:55 PM [place] [address] Office [phone] Fax [phone]

## 2021-07-06 NOTE — Telephone Encounter (Signed)
   [facility] Pre-operative Risk Assessment    Patient Name: [name] Gross  DOB: 08-Nov-1961 MRN: [id number]  HEARTCARE STAFF:  - IMPORTANT!!!!!! Under Visit Info/Reason for Call, type in Other and utilize the format Clearance MM/DD/YY or Clearance TBD. Do not use dashes or single digits. - Please review there is not already an duplicate clearance open for this procedure. - If request is for dental extraction, please clarify the # of teeth to be extracted. - If the patient is currently at the dentist's office, call Pre-Op Callback Staff (MA/nurse) to input urgent request.  - If the patient is not currently in the dentist office, please route to the Pre-Op pool.  Request for surgical clearance:  What type of surgery is being performed? LEFT MEDIAL UKA  When is this surgery scheduled? 08/18/21  What type of clearance is required (medical clearance vs. Pharmacy clearance to hold med vs. Both)? MEDICAL  Are there any medications that need to be held prior to surgery and how long?  ASA   Practice name and name of physician performing surgery? [facility]; DR. Gaynelle Arabian  What is the office phone number? [phone]   7.   What is the office fax number? [phone] ATTN: [place]  8.   Anesthesia type (None, local, MAC, general) ? CHOICE   Julaine Hua 07/06/2021, 1:36 PM  _________________________________________________________________   (provider comments below)

## 2021-07-07 NOTE — Telephone Encounter (Signed)
   Primary Cardiologist: Sherren Mocha, MD  Chart reviewed as part of pre-operative protocol coverage. Given past medical history and time since last visit, based on ACC/AHA guidelines, [name] Gross would be at acceptable risk for the planned procedure without further cardiovascular testing.   His aspirin may be held for 5-7 days prior to his surgery.  Please resume as soon as hemostasis is achieved.  Patient was advised that if he develops new symptoms prior to surgery to contact our office to arrange a follow-up appointment.  He verbalized understanding.  I will route this recommendation to the requesting party via Epic fax function and remove from pre-op pool.  Please call with questions.  Jossie Ng. Carmeron Heady NP-C    07/07/2021, 2:30 PM [place] [address] Office [phone] Fax [phone]

## 2021-07-07 NOTE — Telephone Encounter (Signed)
Yes it is fine for him to hold Aspirin for surgery as needed. Thank you

## 2021-07-14 ENCOUNTER — Telehealth: Payer: Self-pay

## 2021-07-14 NOTE — Telephone Encounter (Signed)
Completed & signed preoperative risk evaluation faxed to number provided. [place] Also sent last OV notes 01/23/21 & labs from that visit.

## 2021-07-28 ENCOUNTER — Ambulatory Visit (INDEPENDENT_AMBULATORY_CARE_PROVIDER_SITE_OTHER): Payer: BC Managed Care – PPO | Admitting: Internal Medicine

## 2021-07-28 ENCOUNTER — Encounter: Payer: Self-pay | Admitting: Internal Medicine

## 2021-07-28 ENCOUNTER — Other Ambulatory Visit: Payer: Self-pay

## 2021-07-28 VITALS — BP 120/76 | HR 76 | Temp 98.6°F | Ht 65.0 in | Wt 175.0 lb

## 2021-07-28 DIAGNOSIS — E538 Deficiency of other specified B group vitamins: Secondary | ICD-10-CM | POA: Diagnosis not present

## 2021-07-28 DIAGNOSIS — E559 Vitamin D deficiency, unspecified: Secondary | ICD-10-CM

## 2021-07-28 DIAGNOSIS — Z0001 Encounter for general adult medical examination with abnormal findings: Secondary | ICD-10-CM

## 2021-07-28 DIAGNOSIS — E1165 Type 2 diabetes mellitus with hyperglycemia: Secondary | ICD-10-CM

## 2021-07-28 DIAGNOSIS — C61 Malignant neoplasm of prostate: Secondary | ICD-10-CM | POA: Diagnosis not present

## 2021-07-28 DIAGNOSIS — E785 Hyperlipidemia, unspecified: Secondary | ICD-10-CM | POA: Diagnosis not present

## 2021-07-28 DIAGNOSIS — Z0000 Encounter for general adult medical examination without abnormal findings: Secondary | ICD-10-CM

## 2021-07-28 DIAGNOSIS — R030 Elevated blood-pressure reading, without diagnosis of hypertension: Secondary | ICD-10-CM

## 2021-07-28 LAB — LIPID PANEL
Cholesterol: 146 mg/dL (ref 0–200)
HDL: 48.5 mg/dL (ref >39.00)
NonHDL: 97.8
Total CHOL/HDL Ratio: 3
Triglycerides: 226 mg/dL — ABNORMAL HIGH (ref 0.0–149.0)
VLDL: 45.2 mg/dL — ABNORMAL HIGH (ref 0.0–40.0)

## 2021-07-28 LAB — VITAMIN D 25 HYDROXY (VIT D DEFICIENCY, FRACTURES): VITD: 42.05 ng/mL (ref 30.00–100.00)

## 2021-07-28 LAB — CBC WITH DIFFERENTIAL/PLATELET
Basophils Absolute: 0.1 10*3/uL (ref 0.0–0.1)
Basophils Relative: 1.8 % (ref 0.0–3.0)
Eosinophils Absolute: 0.5 10*3/uL (ref 0.0–0.7)
Eosinophils Relative: 9.1 % — ABNORMAL HIGH (ref 0.0–5.0)
HCT: 44.9 % (ref 39.0–52.0)
Hemoglobin: 14.9 g/dL (ref 13.0–17.0)
Lymphocytes Relative: 20 % (ref 12.0–46.0)
Lymphs Abs: 1.2 10*3/uL (ref 0.7–4.0)
MCHC: 33.3 g/dL (ref 30.0–36.0)
MCV: 87.7 fl (ref 78.0–100.0)
Monocytes Absolute: 0.4 10*3/uL (ref 0.1–1.0)
Monocytes Relative: 7.2 % (ref 3.0–12.0)
Neutro Abs: 3.6 10*3/uL (ref 1.4–7.7)
Neutrophils Relative %: 61.9 % (ref 43.0–77.0)
Platelets: 297 10*3/uL (ref 150.0–400.0)
RBC: 5.12 Mil/uL (ref 4.22–5.81)
RDW: 13.1 % (ref 11.5–15.5)
WBC: 5.9 10*3/uL (ref 4.0–10.5)

## 2021-07-28 LAB — BASIC METABOLIC PANEL
BUN: 18 mg/dL (ref 6–23)
CO2: 30 mEq/L (ref 19–32)
Calcium: 9.7 mg/dL (ref 8.4–10.5)
Chloride: 101 mEq/L (ref 96–112)
Creatinine, Ser: 1.4 mg/dL (ref 0.40–1.50)
GFR: 54.68 mL/min — ABNORMAL LOW (ref >60.00)
Glucose, Bld: 127 mg/dL — ABNORMAL HIGH (ref 70–99)
Potassium: 4.4 mEq/L (ref 3.5–5.1)
Sodium: 138 mEq/L (ref 135–145)

## 2021-07-28 LAB — LDL CHOLESTEROL, DIRECT: Direct LDL: 64 mg/dL

## 2021-07-28 LAB — URINALYSIS, ROUTINE W REFLEX MICROSCOPIC
Bilirubin Urine: NEGATIVE
Hgb urine dipstick: NEGATIVE
Ketones, ur: NEGATIVE
Leukocytes,Ua: NEGATIVE
Nitrite: NEGATIVE
RBC / HPF: NONE SEEN (ref 0–?)
Specific Gravity, Urine: 1.02 (ref 1.000–1.030)
Total Protein, Urine: NEGATIVE
Urine Glucose: 500 — AB
Urobilinogen, UA: 0.2 (ref 0.0–1.0)
pH: 6 (ref 5.0–8.0)

## 2021-07-28 LAB — HEPATIC FUNCTION PANEL
ALT: 37 U/L (ref 0–53)
AST: 27 U/L (ref 0–37)
Albumin: 4.4 g/dL (ref 3.5–5.2)
Alkaline Phosphatase: 87 U/L (ref 39–117)
Bilirubin, Direct: 0.1 mg/dL (ref 0.0–0.3)
Total Bilirubin: 0.4 mg/dL (ref 0.2–1.2)
Total Protein: 6.5 g/dL (ref 6.0–8.3)

## 2021-07-28 LAB — MICROALBUMIN / CREATININE URINE RATIO
Creatinine,U: 73.4 mg/dL
Microalb Creat Ratio: 1 mg/g (ref 0.0–30.0)
Microalb, Ur: <0.7 mg/dL (ref 0.0–1.9)

## 2021-07-28 LAB — TSH: TSH: 2.58 u[IU]/mL (ref 0.35–5.50)

## 2021-07-28 LAB — PSA: PSA: 0.07 ng/mL — ABNORMAL LOW (ref 0.10–4.00)

## 2021-07-28 LAB — HEMOGLOBIN A1C: Hgb A1c MFr Bld: 6.6 % — ABNORMAL HIGH (ref 4.6–6.5)

## 2021-07-28 LAB — VITAMIN B12: Vitamin B-12: 391 pg/mL (ref 211–911)

## 2021-07-28 NOTE — Patient Instructions (Signed)
Please check with your insurance about the Shingrix shingles shot  Please continue all other medications as before, and refills have been done if requested.  Please have the pharmacy call with any other refills you may need.  Please continue your efforts at being more active, low cholesterol diet, and weight control.  You are otherwise up to date with prevention measures today.  Please keep your appointments with your specialists as you may have planned  Please go to the LAB at the blood drawing area for the tests to be done  You will be contacted by phone if any changes need to be made immediately.  Otherwise, you will receive a letter about your results with an explanation, but please check with MyChart first.  Please remember to sign up for MyChart if you have not done so, as this will be important to you in the future with finding out test results, communicating by private email, and scheduling acute appointments online when needed.  Please make an Appointment to return in 6 months, or sooner if needed, also with Lab Appointment for testing done 3-5 days before at the [facility] (so this is for TWO appointments - please see the scheduling desk as you leave)  Due to the ongoing Covid 19 pandemic, our lab now requires an appointment for any labs done at our office.  If you need labs done and do not have an appointment, please call our office ahead of time to schedule before presenting to the lab for your testing.

## 2021-07-28 NOTE — Progress Notes (Signed)
Patient ID: [name] Gross, male   DOB: 1961/02/09, 60 y.o.   MRN: [id number]         Chief Complaint:: wellness exam and DM       HPI:  [name] Gross is a 60 y.o. male here for wellness exam; declines covid booster, flu shot, shingrix, o/w up to date with preventive referrals and immunzations.                        Also Pt denies chest pain, increased sob or doe, wheezing, orthopnea, PND, increased LE swelling, palpitations, dizziness or syncope.   Pt denies polydipsia, polyuria, or new focal neuro s/s.   Pt denies fever, wt loss, night sweats, loss of appetite, or other constitutional symptoms  No other new complaints.   Due for medial left knee partial replacement soon.    Wt Readings from Last 3 Encounters:  07/28/21 175 lb (79.4 kg)  01/23/21 175 lb (79.4 kg)  01/09/21 178 lb (80.7 kg)   BP Readings from Last 3 Encounters:  07/28/21 120/76  01/23/21 124/78  01/09/21 128/78   Immunization History  Administered Date(s) Administered   Influenza Split 11/18/2011   Influenza Whole 10/15/2008, 10/17/2009, 09/17/2010   Influenza, Seasonal, Injecte, Preservative Fre 11/20/2012   Influenza,inj,Quad PF,6+ Mos 10/11/2014, 10/07/2015, 10/14/2016, 10/17/2017, 10/12/2019   Influenza,inj,quad, With Preservative 11/19/2019   Influenza-Unspecified 09/27/2018, 11/19/2020   PFIZER(Purple Top)SARS-COV-2 Vaccination 03/03/2020, 03/31/2020, 10/22/2020   Pneumococcal Conjugate-13 04/26/2018   Pneumococcal Polysaccharide-23 07/22/2020   Td 12/21/1995   Tdap 04/08/2017   There are no preventive care reminders to display for this patient.     Past Medical History:  Diagnosis Date   Allergic rhinitis    Anxiety    Asthma    mild intermittent   CAD (coronary artery disease)    s/p INF STEMI 12/04/2010: treated with BMS to RCA c/b acute stent thrombosis at completion of  primary PCI ...above tx with second BMS with adjunctive thrombectomy residual CAD at cath 12/04/2010: pRCA 40-50%; LAD and CFX ok;  EF 45% Echo 12/06/2010: Ef 55-65%; ?HK inferior wall; mild MR   Erectile dysfunction    GERD (gastroesophageal reflux disease)    Glucose intolerance (impaired glucose tolerance)    HTN (hypertension)    Obesity    OSA (obstructive sleep apnea)    mild, no CPAP per pt preference   Osteoarthritis    Prostate cancer [facility])    Past Surgical History:  Procedure Laterality Date   CORONARY STENT PLACEMENT     Stenting of the right coronary artery......... Successful percutaneous coronary intervention with  adjunctive thrombectomy for treatment of acute stent thrombosis and   reocclusion of the right coronary artery after treatment earlier today  of an acute myocardial infarction   ELBOW SURGERY     KNEE SURGERY     NASAL SINUS SURGERY     SPERMATOCELECTOMY     WRIST SURGERY      reports that he quit smoking about 38 years ago. His smoking use included cigarettes. He has a 7.50 pack-year smoking history. He has never used smokeless tobacco. He reports that he does not drink alcohol and does not use drugs. family history includes Breast cancer in his maternal aunt; Cancer in his maternal aunt; Colon cancer in his maternal aunt; Coronary artery disease in his father; Diabetes in his father; Esophageal cancer in his mother; Heart disease in his father, maternal aunt, maternal grandfather, maternal grandmother, maternal uncle, and  mother; Hip fracture in his father; Liver cancer in his maternal uncle; Prostate cancer in his brother. Allergies  Allergen Reactions   Amoxicillin     Per the pt "it was years ago and does not remember the reaction"   Current Outpatient Medications on File Prior to Visit  Medication Sig Dispense Refill   ASPIRIN LOW DOSE 81 MG EC tablet TAKE 1 TABLET BY MOUTH EVERY DAY 90 tablet 3   escitalopram (LEXAPRO) 10 MG tablet TAKE 1 TABLET BY MOUTH EVERY DAY 90 tablet 2   glipiZIDE (GLUCOTROL XL) 5 MG 24 hr tablet Take 1 tablet (5 mg total) by mouth daily with breakfast. 90  tablet 3   Magnesium 500 MG CAPS Take 1 capsule by mouth daily.      metFORMIN (GLUCOPHAGE-XR) 500 MG 24 hr tablet Take 2 tablets (1,000 mg total) by mouth daily with breakfast. 180 tablet 3   nitroGLYCERIN (NITROSTAT) 0.4 MG SL tablet Place 1 tablet (0.4 mg total) under the tongue every 5 (five) minutes as needed for chest pain. 25 tablet 3   rosuvastatin (CRESTOR) 5 MG tablet TAKE 1 TABLET BY MOUTH EVERY DAY AT 6PM 90 tablet 2   traMADol (ULTRAM) 50 MG tablet TAKE 1 TABLET BY MOUTH TWICE A DAY AS NEEDED FOR PAIN 60 tablet 2   No current facility-administered medications on file prior to visit.        ROS:  All others reviewed and negative.  Objective        PE:  BP 120/76 (BP Location: Left Arm, Patient Position: Sitting, Cuff Size: Normal)   Pulse 76   Temp 98.6 F (37 C) (Oral)   Ht '5\' 5"'$  (1.651 m)   Wt 175 lb (79.4 kg)   SpO2 95%   BMI 29.12 kg/m                 Constitutional: Pt appears in NAD               HENT: Head: NCAT.                Right Ear: External ear normal.                 Left Ear: External ear normal.                Eyes: . Pupils are equal, round, and reactive to light. Conjunctivae and EOM are normal               Nose: without d/c or deformity               Neck: Neck supple. Gross normal ROM               Cardiovascular: Normal rate and regular rhythm.                 Pulmonary/Chest: Effort normal and breath sounds without rales or wheezing.                Abd:  Soft, NT, ND, + BS, no organomegaly               Neurological: Pt is alert. At baseline orientation, motor grossly intact               Skin: Skin is warm. No rashes, no other new lesions, LE edema - none               Psychiatric: Pt behavior is normal without agitation  Micro: none  Cardiac tracings I have personally interpreted today:  none  Pertinent Radiological findings (summarize): none   Lab Results  Component Value Date   WBC 5.9 07/28/2021   HGB 14.9 07/28/2021   HCT 44.9  07/28/2021   PLT 297.0 07/28/2021   GLUCOSE 127 (H) 07/28/2021   CHOL 146 07/28/2021   TRIG 226.0 (H) 07/28/2021   HDL 48.50 07/28/2021   LDLDIRECT 64.0 07/28/2021   LDLCALC 65 07/22/2020   ALT 37 07/28/2021   AST 27 07/28/2021   NA 138 07/28/2021   K 4.4 07/28/2021   CL 101 07/28/2021   CREATININE 1.40 07/28/2021   BUN 18 07/28/2021   CO2 30 07/28/2021   TSH 2.58 07/28/2021   PSA 0.07 (L) 07/28/2021   INR 1.00 12/22/2010   HGBA1C 6.6 (H) 07/28/2021   MICROALBUR <0.7 07/28/2021   Assessment/Plan:  TYMAR PULGARIN is a 60 y.o. White or Caucasian [1] male with  has a past medical history of Allergic rhinitis, Anxiety, Asthma, CAD (coronary artery disease), Erectile dysfunction, GERD (gastroesophageal reflux disease), Glucose intolerance (impaired glucose tolerance), HTN (hypertension), Obesity, OSA (obstructive sleep apnea), Osteoarthritis, and Prostate cancer ([place]).  Encounter for well adult exam with abnormal findings Age and sex appropriate education and counseling updated with regular exercise and diet Referrals for preventative services - none needed Immunizations addressed - declines covid booster, flu shot, shingrix Smoking counseling  - none needed Evidence for depression or other mood disorder - none significant Most recent labs reviewed. I have personally reviewed and have noted: 1) the patient's medical and social history 2) The patient's current medications and supplements 3) The patient's height, weight, and BMI have been recorded in the chart   Vitamin D deficiency Last vitamin D Lab Results  Component Value Date   VD25OH 42.05 07/28/2021   Stable, cont oral replacement   Dyslipidemia Lab Results  Component Value Date   LDLCALC 65 07/22/2020   Stable, pt to continue current statin crestor   Diabetes mellitus ([place]) Lab Results  Component Value Date   HGBA1C 6.6 (H) 07/28/2021   Stable, pt to continue current medical treatment metformin,  glucotrol   Elevated blood pressure reading without diagnosis of hypertension BP Readings from Last 3 Encounters:  07/28/21 120/76  01/23/21 124/78  01/09/21 128/78   Stable, pt to continue medical treatment - diet  Followup: Return in about 6 months (around 01/28/2022).  Cathlean Cower, MD 08/01/2021 3:34 PM [facility]

## 2021-08-01 ENCOUNTER — Encounter: Payer: Self-pay | Admitting: Internal Medicine

## 2021-08-01 NOTE — Assessment & Plan Note (Signed)
Lab Results  Component Value Date   HGBA1C 6.6 (H) 07/28/2021   Stable, pt to continue current medical treatment metformin, glucotrol

## 2021-08-01 NOTE — Assessment & Plan Note (Signed)
BP Readings from Last 3 Encounters:  07/28/21 120/76  01/23/21 124/78  01/09/21 128/78   Stable, pt to continue medical treatment - diet

## 2021-08-01 NOTE — Assessment & Plan Note (Signed)
Age and sex appropriate education and counseling updated with regular exercise and diet Referrals for preventative services - none needed Immunizations addressed - declines covid booster, flu shot, shingrix Smoking counseling  - none needed Evidence for depression or other mood disorder - none significant Most recent labs reviewed. I have personally reviewed and have noted: 1) the patient's medical and social history 2) The patient's current medications and supplements 3) The patient's height, weight, and BMI have been recorded in the chart  

## 2021-08-01 NOTE — Assessment & Plan Note (Signed)
Last vitamin D Lab Results  Component Value Date   VD25OH 42.05 07/28/2021   Stable, cont oral replacement  

## 2021-08-01 NOTE — Assessment & Plan Note (Signed)
Lab Results  Component Value Date   LDLCALC 65 07/22/2020   Stable, pt to continue current statin crestor

## 2021-09-03 ENCOUNTER — Other Ambulatory Visit: Payer: Self-pay | Admitting: Internal Medicine

## 2021-10-12 IMAGING — US US ABDOMEN COMPLETE
1 series · 14 of 25 positions shown · non-contrast
Comparison: 05/26/2011 and prior.

CLINICAL DATA: Right upper quadrant pain.

EXAM:
ABDOMEN ULTRASOUND COMPLETE

[Series 1: us abdomen complete · 14 of 83 slices shown]
[im 1/83]
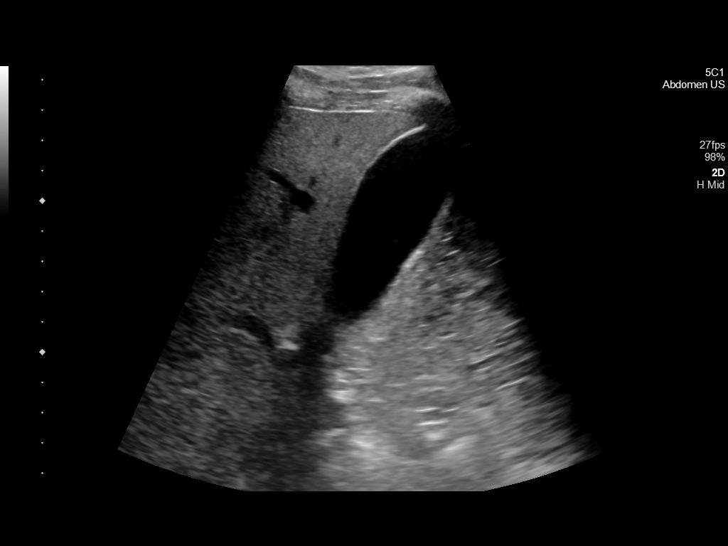
[im 7/83]
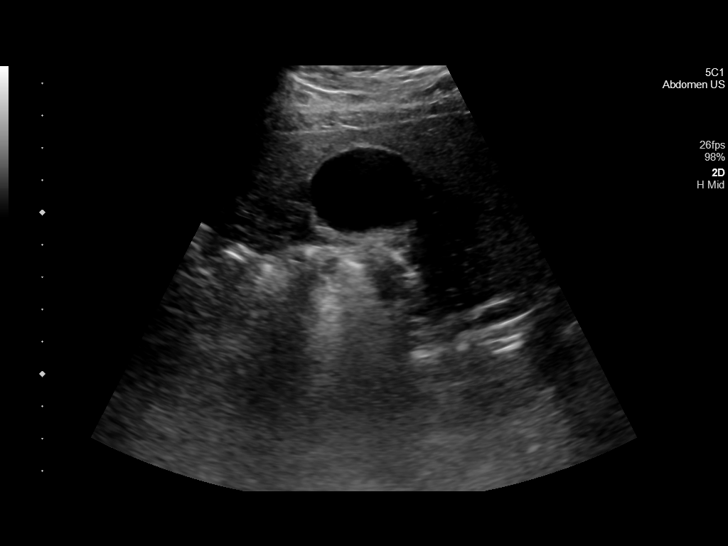
[im 14/83]
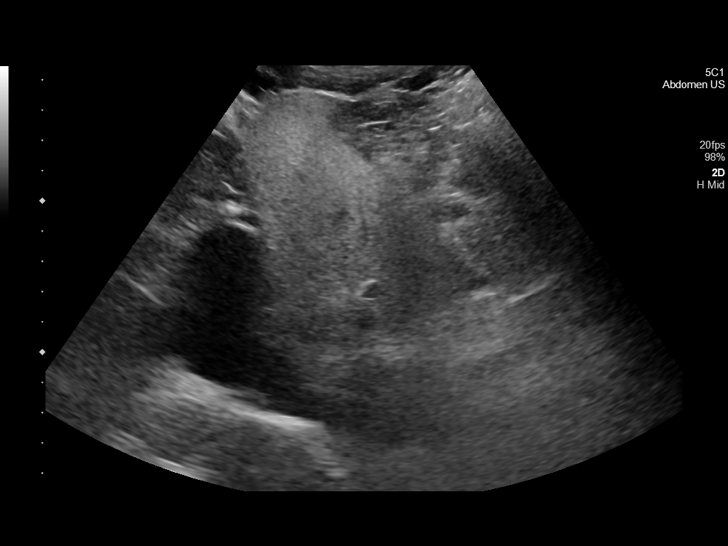
[im 21/83]
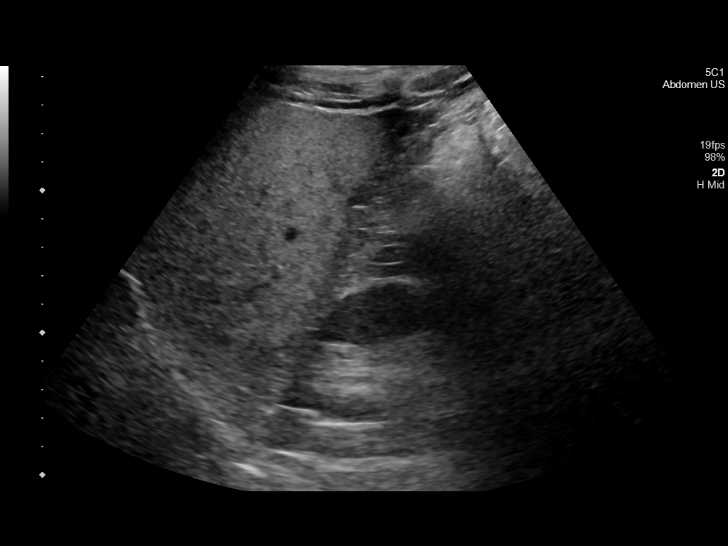
[im 28/83]
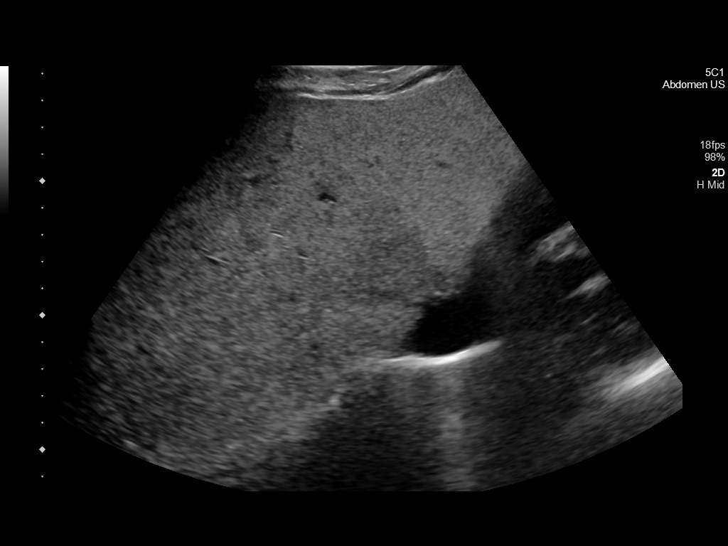
[im 31/83]
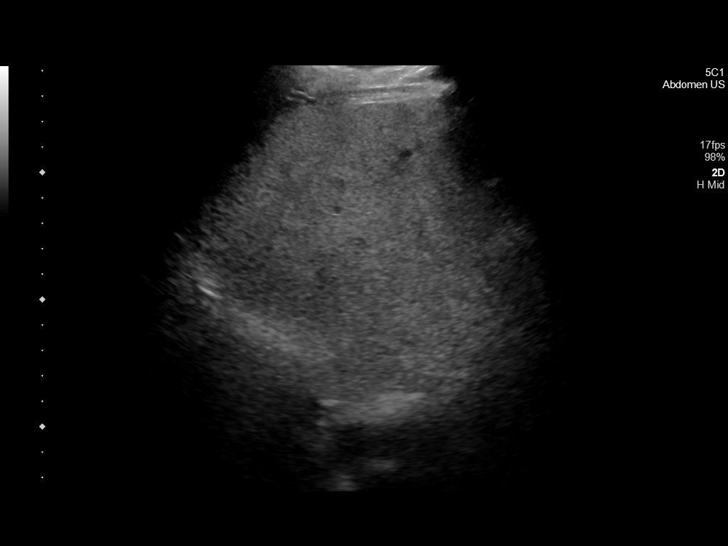
[im 38/83]
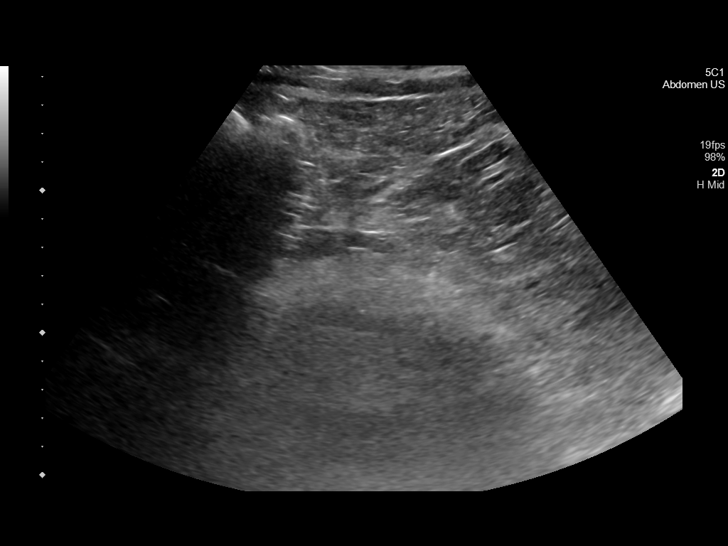
[im 45/83]
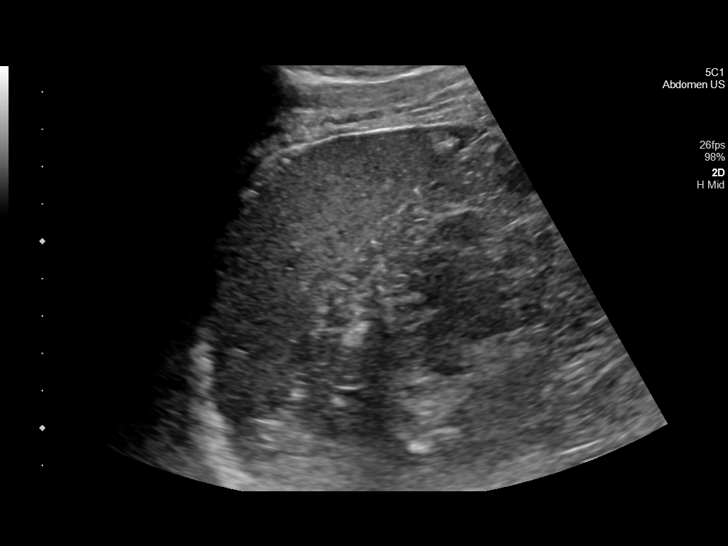
[im 52/83]
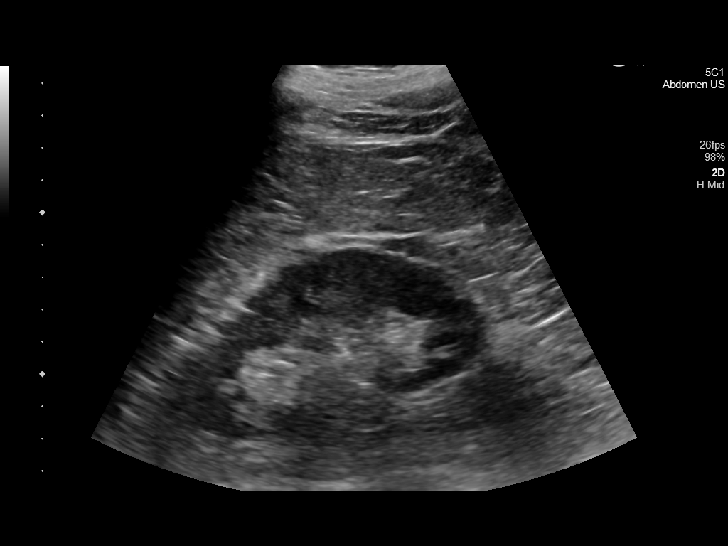
[im 55/83]
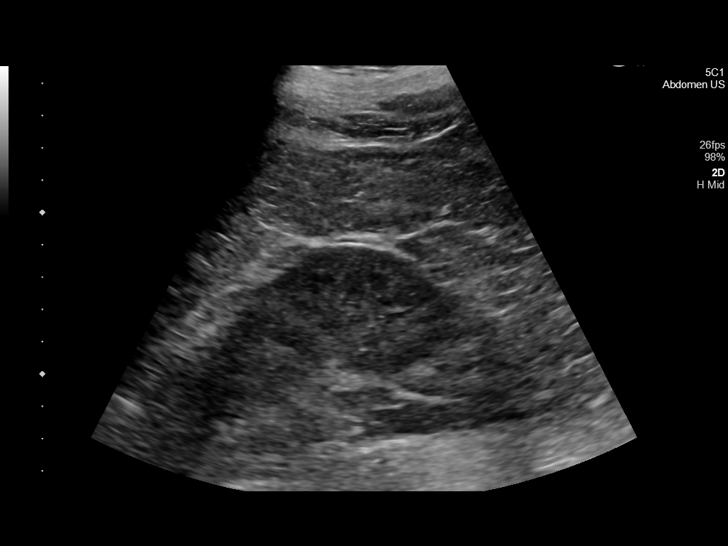
[im 62/83]
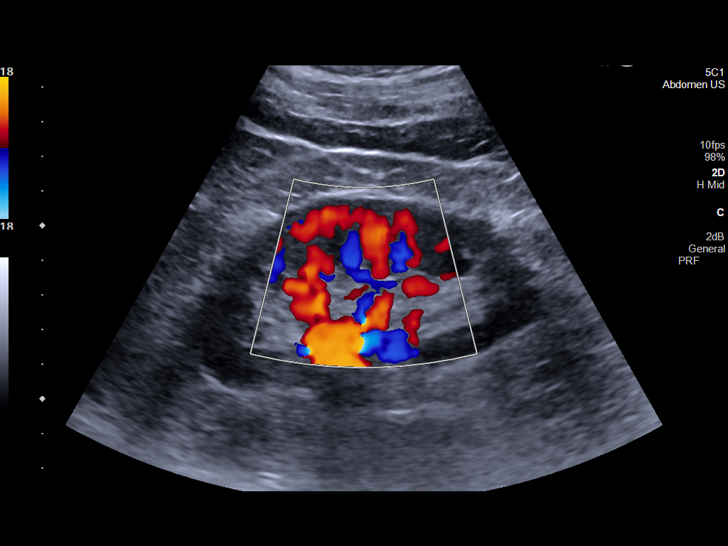
[im 69/83]
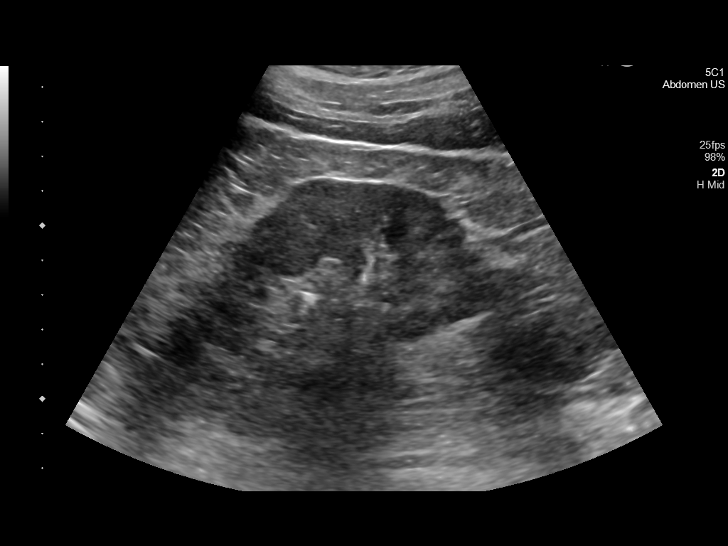
[im 76/83]
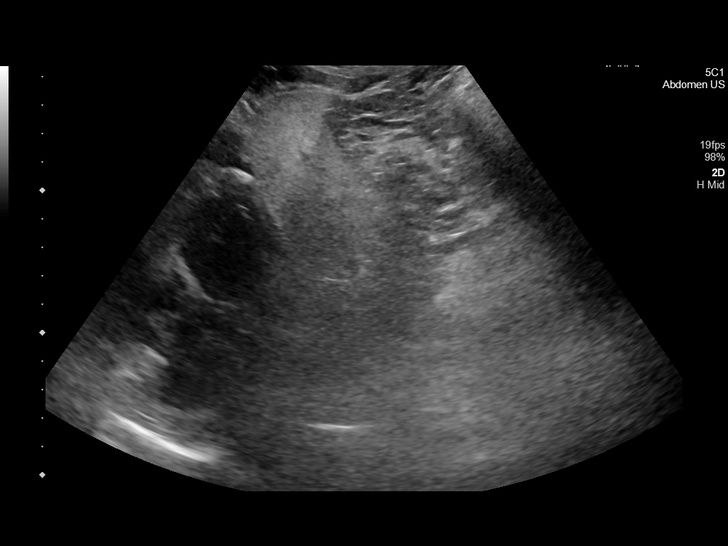
[im 83/83]
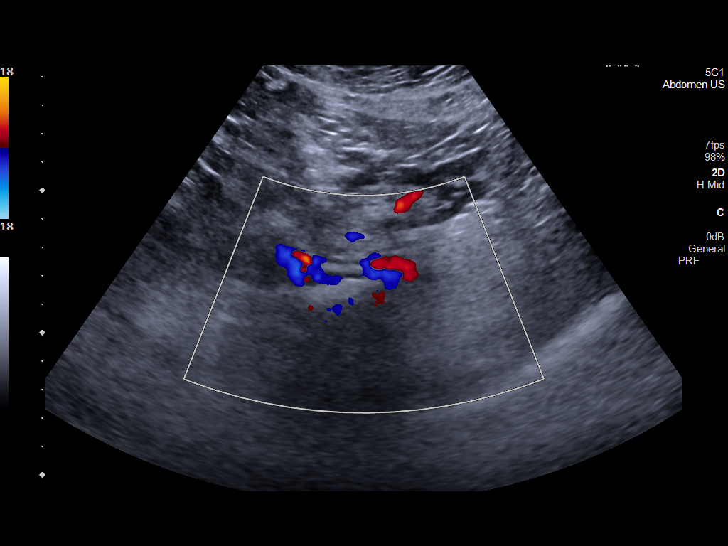

[14 of 25 positions shown; findings below may reference images not displayed]

FINDINGS: Gallbladder: No gallstones or wall thickening visualized. No
sonographic Murphy sign noted by sonographer.

Common bile duct: Diameter: 2.3 mm

Liver: No focal lesion identified. Increased parenchymal
echogenicity. Portal vein is patent on color Doppler imaging with
normal direction of blood flow towards the liver.

IVC: No abnormality visualized.

Pancreas: Not well visualized.

Spleen: Size and appearance within normal limits.

Right Kidney: Length: 9.8 cm. Echogenicity within normal limits. No
mass or hydronephrosis visualized.

Left Kidney: Length: 10.0 cm. Echogenicity within normal limits. No
mass or hydronephrosis visualized. 5 mm shadowing upper pole
echogenic focus.

Abdominal aorta: No aneurysm visualized.

Other findings: None.
IMPRESSION: 5 mm left upper pole calculus.  No hydronephrosis.

Hepatic steatosis.  No focal hepatic lesion.

## 2021-10-20 ENCOUNTER — Other Ambulatory Visit: Payer: Self-pay | Admitting: Internal Medicine

## 2021-10-20 NOTE — Telephone Encounter (Signed)
Please refill as per office routine med refill policy (all routine meds to be refilled for 3 mo or monthly (per pt preference) up to one year from last visit, then month to month grace period for 3 mo, then further med refills will have to be denied) ? ?

## 2021-11-20 ENCOUNTER — Other Ambulatory Visit: Payer: Self-pay | Admitting: Cardiovascular Disease

## 2021-12-24 ENCOUNTER — Telehealth: Payer: Self-pay | Admitting: *Deleted

## 2021-12-24 NOTE — Telephone Encounter (Signed)
° °  Pre-operative Risk Assessment    Patient Name: [name] Gross  DOB: April 05, 1961 MRN: [id number]      Request for Surgical Clearance    Procedure:   RIGHT DISTAL BICEPS TENDON REPAIR  Date of Surgery:  Clearance TBD                                 Surgeon:  DR. Orene Desanctis  Surgeon's Group or Practice Name:  [facility] Phone number:  [phone] Fax number:  [phone] ATTN: ASHLEY HILTON   Type of Clearance Requested:   - Medical  - Pharmacy:  Hold Aspirin     Type of Anesthesia:   REGIONAL BLOCK   Additional requests/questions:    Jiles Prows   12/24/2021, 6:03 PM

## 2021-12-25 NOTE — Telephone Encounter (Signed)
Primary [place], MD  Chart reviewed as part of pre-operative protocol coverage. Because of [name] Gross's past medical history and time since last visit, he/she will require a follow-up visit in order to better assess preoperative cardiovascular risk.  Pre-op covering staff: - Please schedule appointment and call patient to inform them. - Please contact requesting surgeon's office via preferred method (i.e, phone, fax) to inform them of need for appointment prior to surgery.  If applicable, this message will also be routed to pharmacy pool and/or primary cardiologist for input on holding anticoagulant/antiplatelet agent as requested below so that this information is available at time of patient's appointment.   Deberah Pelton, NP  12/25/2021, 11:46 AM

## 2021-12-25 NOTE — Telephone Encounter (Signed)
S/w the pt and he is agreeable to appt for pre op clearance. Pt tells me that he might not proceed with the surgery. Pt states he would like to make the appt still for Monday 12/28/21 with Laurann Montana, NP incase he does decide to proceed. Appt 12/28/21 @ 3:10. I will forward notes to NP for upcoming appt. Will send FYI to requesting office pt has appt 12/28/21.

## 2021-12-27 ENCOUNTER — Encounter: Payer: Self-pay | Admitting: Internal Medicine

## 2021-12-27 ENCOUNTER — Other Ambulatory Visit: Payer: Self-pay | Admitting: Internal Medicine

## 2021-12-27 NOTE — Progress Notes (Deleted)
Cardiology Office Note:    Date:  12/27/2021   ID:  [name] Gross, DOB 12-26-1960, MRN [id number]  PCP:  Biagio Borg, MD   [facility] Cardiologist:  Sherren Mocha, MD { Click to update primary MD,subspecialty MD or APP then REFRESH:1}    Referring MD: Biagio Borg, MD   No chief complaint on file. ***  History of Present Illness:    [name] Gross is a 61 y.o. male with a hx of CAD, MI, OSA, GERD, prostate cancer s/p prostatectomy, and DM.  He had inferior STEMI 12/04/2010. He was treated with a BMS to the RCA which was occluded in the mid vessel.  Post intervention he developed recurrent chest pain with ST elevation.  He underwent emergent relook cath which demonstrated acute stent thrombosis and he was treated with a second BMS with adjunctive thrombectomy.  EF was low at 45% with inferior dyskinesis on cath.  Echo 12/06/2010 demonstrated an EF 55-65%; ? inferior HK and mild MR.   He was last seen in our office by Dr. Burt Knack on 01/09/21.   He was previously cleared for left medial UKA in July 2022. A 12 month follow-up was recommended.    Today, he is here for cardiac evaluation for upcoming right distal biceps tendon repiar (Request to hold aspirin)    Past Medical History:  Diagnosis Date   Allergic rhinitis    Anxiety    Asthma    mild intermittent   CAD (coronary artery disease)    s/p INF STEMI 12/04/2010: treated with BMS to RCA c/b acute stent thrombosis at completion of  primary PCI ...above tx with second BMS with adjunctive thrombectomy residual CAD at cath 12/04/2010: pRCA 40-50%; LAD and CFX ok; EF 45% Echo 12/06/2010: Ef 55-65%; ?HK inferior wall; mild MR   Erectile dysfunction    GERD (gastroesophageal reflux disease)    Glucose intolerance (impaired glucose tolerance)    HTN (hypertension)    Obesity    OSA (obstructive sleep apnea)    mild, no CPAP per pt preference   Osteoarthritis    Prostate cancer [facility])     Past Surgical History:   Procedure Laterality Date   CORONARY STENT PLACEMENT     Stenting of the right coronary artery......... Successful percutaneous coronary intervention with  adjunctive thrombectomy for treatment of acute stent thrombosis and   reocclusion of the right coronary artery after treatment earlier today  of an acute myocardial infarction   ELBOW SURGERY     KNEE SURGERY     NASAL SINUS SURGERY     SPERMATOCELECTOMY     WRIST SURGERY      Current Medications: No outpatient medications have been marked as taking for the 12/28/21 encounter (Appointment) with Emmaline Life, NP.     Allergies:   Amoxicillin   Social History   Socioeconomic History   Marital status: Married    Spouse name: Not on file   Number of children: 2   Years of education: Not on file   Highest education level: Not on file  Occupational History    Comment: full time  Tobacco Use   Smoking status: Former    Packs/day: 0.50    Years: 15.00    Pack years: 7.50    Types: Cigarettes    Quit date: 10/20/1982    Years since quitting: 39.2   Smokeless tobacco: Never  Vaping Use   Vaping Use: Never used  Substance and Sexual Activity  Alcohol use: No   Drug use: No   Sexual activity: Yes  Other Topics Concern   Not on file  Social History Narrative   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 Resides in [place] with wife. Has one son and one daughter.   Social Determinants of Health   Financial Resource Strain: Not on file  Food Insecurity: Not on file  Transportation Needs: Not on file  Physical Activity: Not on file  Stress: Not on file  Social Connections: Not on file     Family History: The patient's ***family history includes Breast cancer in his maternal aunt; Cancer in his maternal aunt; Colon cancer in his maternal aunt; Coronary artery disease in his father; Diabetes in his father; Esophageal cancer in his mother; Heart disease in his father, maternal aunt, maternal grandfather, maternal grandmother,  maternal uncle, and mother; Hip fracture in his father; Liver cancer in his maternal uncle; Prostate cancer in his brother. There is no history of Rectal cancer or Stomach cancer.  ROS:   Please see the history of present illness.    *** All other systems reviewed and are negative.  Labs/Other Studies Reviewed:    The following studies were reviewed today:  ETT 6/16  There was no ST segment deviation noted during stress. Blood pressure demonstrated a hypertensive response to exercise.  Recent Labs: 07/28/2021: ALT 37; BUN 18; Creatinine, Ser 1.40; Hemoglobin 14.9; Platelets 297.0; Potassium 4.4; Sodium 138; TSH 2.58  Recent Lipid Panel    Component Value Date/Time   CHOL 146 07/28/2021 1051   TRIG 226.0 (H) 07/28/2021 1051   TRIG 241 (HH) 10/31/2006 0917   HDL 48.50 07/28/2021 1051   CHOLHDL 3 07/28/2021 1051   VLDL 45.2 (H) 07/28/2021 1051   LDLCALC 65 07/22/2020 1112   LDLDIRECT 64.0 07/28/2021 1051     Risk Assessment/Calculations:   {Does this patient have ATRIAL FIBRILLATION?:[phone]}       Physical Exam:    VS:  There were no vitals taken for this visit.    Wt Readings from Last 3 Encounters:  07/28/21 175 lb (79.4 kg)  01/23/21 175 lb (79.4 kg)  01/09/21 178 lb (80.7 kg)     GEN: *** Well nourished, well developed in no acute distress HEENT: Normal NECK: No JVD; No carotid bruits LYMPHATICS: No lymphadenopathy CARDIAC: ***RRR, no murmurs, rubs, gallops RESPIRATORY:  Clear to auscultation without rales, wheezing or rhonchi  ABDOMEN: Soft, non-tender, non-distended MUSCULOSKELETAL:  No edema; No deformity  SKIN: Warm and dry NEUROLOGIC:  Alert and oriented x 3 PSYCHIATRIC:  Normal affect   EKG:  EKG is *** ordered today.  The ekg ordered today demonstrates ***  Diagnoses:    No diagnosis found. Assessment and Plan:     ***      {Are you ordering a CV Procedure (e.g. stress test, cath, DCCV, TEE, etc)?   Press F2        :[id number]}     Medication Adjustments/Labs and Tests Ordered: Current medicines are reviewed at length with the patient today.  Concerns regarding medicines are outlined above.  No orders of the defined types were placed in this encounter.  No orders of the defined types were placed in this encounter.   There are no Patient Instructions on file for this visit.   Signed, Emmaline Life, NP  12/27/2021 7:09 PM    [facility]

## 2021-12-28 ENCOUNTER — Encounter (HOSPITAL_BASED_OUTPATIENT_CLINIC_OR_DEPARTMENT_OTHER): Payer: Self-pay

## 2021-12-28 ENCOUNTER — Ambulatory Visit (HOSPITAL_BASED_OUTPATIENT_CLINIC_OR_DEPARTMENT_OTHER): Payer: BC Managed Care – PPO | Admitting: Nurse Practitioner

## 2022-01-01 ENCOUNTER — Telehealth (INDEPENDENT_AMBULATORY_CARE_PROVIDER_SITE_OTHER): Payer: BC Managed Care – PPO | Admitting: Internal Medicine

## 2022-01-01 DIAGNOSIS — E559 Vitamin D deficiency, unspecified: Secondary | ICD-10-CM | POA: Diagnosis not present

## 2022-01-01 DIAGNOSIS — E1165 Type 2 diabetes mellitus with hyperglycemia: Secondary | ICD-10-CM | POA: Diagnosis not present

## 2022-01-01 DIAGNOSIS — J019 Acute sinusitis, unspecified: Secondary | ICD-10-CM

## 2022-01-01 MED ORDER — DOXYCYCLINE HYCLATE 100 MG PO TABS: 100.0000 mg | ORAL_TABLET | Freq: Two times a day (BID) | ORAL | 0 refills | Status: DC

## 2022-01-01 MED ORDER — HYDROCODONE BIT-HOMATROP MBR 5-1.5 MG/5ML PO SOLN: 5.0000 mL | Freq: Four times a day (QID) | ORAL | 0 refills | Status: AC | PRN

## 2022-01-02 ENCOUNTER — Encounter: Payer: Self-pay | Admitting: Internal Medicine

## 2022-01-02 DIAGNOSIS — J019 Acute sinusitis, unspecified: Secondary | ICD-10-CM | POA: Insufficient documentation

## 2022-01-02 NOTE — Assessment & Plan Note (Signed)
Lab Results  Component Value Date   HGBA1C 6.6 (H) 07/28/2021   Stable, pt to continue current medical treatment glucotrol , metformin

## 2022-01-02 NOTE — Patient Instructions (Signed)
Please take all new medication as prescribed 

## 2022-01-02 NOTE — Assessment & Plan Note (Signed)
Last vitamin D Lab Results  Component Value Date   VD25OH 42.05 07/28/2021   Stable, cont oral replacement  

## 2022-01-02 NOTE — Assessment & Plan Note (Signed)
Mild to mod, for antibx course,  to f/u any worsening symptoms or concerns 

## 2022-01-02 NOTE — Progress Notes (Signed)
Patient ID: [name] Gross, male   DOB: [birth year], 61 y.o.   MRN: [id number]  Virtual Visit via Video Note  I connected with [name] Gross on Jan 01, 2022 at 10:00 AM EST by a video enabled telemedicine application and verified that I am speaking with the correct person using two identifiers.  Location of all participants today Patient: at home Provider: at office   I discussed the limitations of evaluation and management by telemedicine and the availability of in person appointments. The patient expressed understanding and agreed to proceed.  History of Present Illness:  Here with 2-3 days acute onset fever, facial pain, pressure, headache, general weakness and malaise, and greenish d/c, with mild ST and cough, but pt denies chest pain, wheezing, increased sob or doe, orthopnea, PND, increased LE swelling, palpitations, dizziness or syncope.  Pt denies polydipsia, polyuria, or new focal neuro s/s.   Pt denies wt loss, night sweats, loss of appetite, or other constitutional symptoms   COVD neg yesterday at home.  Taking vit d Past Medical History:  Diagnosis Date   Allergic rhinitis    Anxiety    Asthma    mild intermittent   CAD (coronary artery disease)    s/p INF STEMI 12/04/2010: treated with BMS to RCA c/b acute stent thrombosis at completion of  primary PCI ...above tx with second BMS with adjunctive thrombectomy residual CAD at cath 12/04/2010: pRCA 40-50%; LAD and CFX ok; EF 45% Echo 12/06/2010: Ef 55-65%; ?HK inferior wall; mild MR   Erectile dysfunction    GERD (gastroesophageal reflux disease)    Glucose intolerance (impaired glucose tolerance)    HTN (hypertension)    Obesity    OSA (obstructive sleep apnea)    mild, no CPAP per pt preference   Osteoarthritis    Prostate cancer [facility]    Past Surgical History:  Procedure Laterality Date   CORONARY STENT PLACEMENT     Stenting of the right coronary artery......... Successful percutaneous coronary intervention with  adjunctive  thrombectomy for treatment of acute stent thrombosis and   reocclusion of the right coronary artery after treatment earlier today  of an acute myocardial infarction   ELBOW SURGERY     KNEE SURGERY     NASAL SINUS SURGERY     SPERMATOCELECTOMY     WRIST SURGERY      reports that he quit smoking about 39 years ago. His smoking use included cigarettes. He has a 7.50 pack-year smoking history. He has never used smokeless tobacco. He reports that he does not drink alcohol and does not use drugs. family history includes Breast cancer in his maternal aunt; Cancer in his maternal aunt; Colon cancer in his maternal aunt; Coronary artery disease in his father; Diabetes in his father; Esophageal cancer in his mother; Heart disease in his father, maternal aunt, maternal grandfather, maternal grandmother, maternal uncle, and mother; Hip fracture in his father; Liver cancer in his maternal uncle; Prostate cancer in his brother. Allergies  Allergen Reactions   Amoxicillin     Per the pt "it was years ago and does not remember the reaction"   Current Outpatient Medications on File Prior to Visit  Medication Sig Dispense Refill   ASPIRIN LOW DOSE 81 MG EC tablet TAKE 1 TABLET BY MOUTH EVERY DAY 90 tablet 3   escitalopram (LEXAPRO) 10 MG tablet TAKE 1 TABLET BY MOUTH EVERY DAY 90 tablet 2   glipiZIDE (GLUCOTROL XL) 5 MG 24 hr tablet TAKE 1 TABLET BY  MOUTH EVERY DAY WITH BREAKFAST 90 tablet 2   Magnesium 500 MG CAPS Take 1 capsule by mouth daily.      metFORMIN (GLUCOPHAGE-XR) 500 MG 24 hr tablet Take 2 tablets (1,000 mg total) by mouth daily with breakfast. 180 tablet 3   nitroGLYCERIN (NITROSTAT) 0.4 MG SL tablet Place 1 tablet (0.4 mg total) under the tongue every 5 (five) minutes as needed for chest pain. 25 tablet 3   rosuvastatin (CRESTOR) 5 MG tablet Take 1 tablet (5 mg total) by mouth daily at 6 PM. Please make yearly appt with Dr. Burt Knack for January 2023 for future refills. Thank you 1st attempt 90  tablet 0   traMADol (ULTRAM) 50 MG tablet TAKE 1 TABLET BY MOUTH TWICE A DAY AS NEEDED FOR PAIN 60 tablet 2   No current facility-administered medications on file prior to visit.    Observations/Objective: Alert, NAD, appropriate mood and affect, resps normal, cn 2-12 intact, moves all 4s, no visible rash or swelling Lab Results  Component Value Date   WBC 5.9 07/28/2021   HGB 14.9 07/28/2021   HCT 44.9 07/28/2021   PLT 297.0 07/28/2021   GLUCOSE 127 (H) 07/28/2021   CHOL 146 07/28/2021   TRIG 226.0 (H) 07/28/2021   HDL 48.50 07/28/2021   LDLDIRECT 64.0 07/28/2021   LDLCALC 65 07/22/2020   ALT 37 07/28/2021   AST 27 07/28/2021   NA 138 07/28/2021   K 4.4 07/28/2021   CL 101 07/28/2021   CREATININE 1.40 07/28/2021   BUN 18 07/28/2021   CO2 30 07/28/2021   TSH 2.58 07/28/2021   PSA 0.07 (L) 07/28/2021   INR 1.00 12/22/2010   HGBA1C 6.6 (H) 07/28/2021   MICROALBUR <0.7 07/28/2021   Assessment and Plan: See notes  Follow Up Instructions: See notes   I discussed the assessment and treatment plan with the patient. The patient was provided an opportunity to ask questions and all were answered. The patient agreed with the plan and demonstrated an understanding of the instructions.   The patient was advised to call back or seek an in-person evaluation if the symptoms worsen or if the condition fails to improve as anticipated.  Cathlean Cower, MD

## 2022-01-28 ENCOUNTER — Encounter: Payer: Self-pay | Admitting: Internal Medicine

## 2022-01-28 ENCOUNTER — Other Ambulatory Visit: Payer: Self-pay

## 2022-01-28 ENCOUNTER — Ambulatory Visit (INDEPENDENT_AMBULATORY_CARE_PROVIDER_SITE_OTHER): Payer: BC Managed Care – PPO | Admitting: Internal Medicine

## 2022-01-28 VITALS — BP 110/78 | HR 78 | Temp 97.8°F | Ht 64.0 in | Wt 181.1 lb

## 2022-01-28 DIAGNOSIS — E1165 Type 2 diabetes mellitus with hyperglycemia: Secondary | ICD-10-CM

## 2022-01-28 DIAGNOSIS — E781 Pure hyperglyceridemia: Secondary | ICD-10-CM | POA: Diagnosis not present

## 2022-01-28 DIAGNOSIS — Z0000 Encounter for general adult medical examination without abnormal findings: Secondary | ICD-10-CM

## 2022-01-28 DIAGNOSIS — E559 Vitamin D deficiency, unspecified: Secondary | ICD-10-CM

## 2022-01-28 DIAGNOSIS — J45909 Unspecified asthma, uncomplicated: Secondary | ICD-10-CM

## 2022-01-28 DIAGNOSIS — R030 Elevated blood-pressure reading, without diagnosis of hypertension: Secondary | ICD-10-CM

## 2022-01-28 DIAGNOSIS — E538 Deficiency of other specified B group vitamins: Secondary | ICD-10-CM

## 2022-01-28 LAB — LDL CHOLESTEROL, DIRECT: Direct LDL: 67 mg/dL

## 2022-01-28 LAB — BASIC METABOLIC PANEL
BUN: 15 mg/dL (ref 6–23)
CO2: 33 mEq/L — ABNORMAL HIGH (ref 19–32)
Calcium: 9.3 mg/dL (ref 8.4–10.5)
Chloride: 102 mEq/L (ref 96–112)
Creatinine, Ser: 1.21 mg/dL (ref 0.40–1.50)
GFR: 64.9 mL/min (ref >60.00)
Glucose, Bld: 254 mg/dL — ABNORMAL HIGH (ref 70–99)
Potassium: 3.9 mEq/L (ref 3.5–5.1)
Sodium: 139 mEq/L (ref 135–145)

## 2022-01-28 LAB — HEMOGLOBIN A1C: Hgb A1c MFr Bld: 7.2 % — ABNORMAL HIGH (ref 4.6–6.5)

## 2022-01-28 LAB — LIPID PANEL
Cholesterol: 144 mg/dL (ref 0–200)
HDL: 48.3 mg/dL (ref >39.00)
NonHDL: 96.01
Total CHOL/HDL Ratio: 3
Triglycerides: 242 mg/dL — ABNORMAL HIGH (ref 0.0–149.0)
VLDL: 48.4 mg/dL — ABNORMAL HIGH (ref 0.0–40.0)

## 2022-01-28 LAB — HEPATIC FUNCTION PANEL
ALT: 54 U/L — ABNORMAL HIGH (ref 0–53)
AST: 34 U/L (ref 0–37)
Albumin: 4.2 g/dL (ref 3.5–5.2)
Alkaline Phosphatase: 100 U/L (ref 39–117)
Bilirubin, Direct: 0.1 mg/dL (ref 0.0–0.3)
Total Bilirubin: 0.5 mg/dL (ref 0.2–1.2)
Total Protein: 6.7 g/dL (ref 6.0–8.3)

## 2022-01-28 MED ORDER — GLIPIZIDE ER 2.5 MG PO TB24: 2.5000 mg | ORAL_TABLET | Freq: Every day | ORAL | 3 refills | Status: DC

## 2022-01-28 NOTE — Assessment & Plan Note (Signed)
Lab Results  Component Value Date   CHOL 146 07/28/2021   HDL 48.50 07/28/2021   LDLCALC 65 07/22/2020   LDLDIRECT 64.0 07/28/2021   TRIG 226.0 (H) 07/28/2021   CHOLHDL 3 07/28/2021  also for low fat/DM diet

## 2022-01-28 NOTE — Assessment & Plan Note (Signed)
Stable overall, to continue inhaler prn 

## 2022-01-28 NOTE — Assessment & Plan Note (Signed)
BP Readings from Last 3 Encounters:  01/28/22 110/78  07/28/21 120/76  01/23/21 124/78   Stable, pt to continue medical treatment  - diet, wt control

## 2022-01-28 NOTE — Patient Instructions (Signed)
Ok to decrease the glipizide ER 5 mg to 2.5 mg per day instead due to the lower sugars  Please continue all other medications as before, and refills have been done if requested.  Please have the pharmacy call with any other refills you may need.  Please continue your efforts at being more active, low cholesterol diet, and weight control.  Please keep your appointments with your specialists as you may have planned  Please go to the LAB at the blood drawing area for the tests to be done  You will be contacted by phone if any changes need to be made immediately.  Otherwise, you will receive a letter about your results with an explanation, but please check with MyChart first.  Please remember to sign up for MyChart if you have not done so, as this will be important to you in the future with finding out test results, communicating by private email, and scheduling acute appointments online when needed.  Please make an Appointment to return in 6 months, or sooner if needed, also with Lab Appointment for testing done 3-5 days before at the [facility] (so this is for TWO appointments - please see the scheduling desk as you leave)  Due to the ongoing Covid 19 pandemic, our lab now requires an appointment for any labs done at our office.  If you need labs done and do not have an appointment, please call our office ahead of time to schedule before presenting to the lab for your testing.

## 2022-01-28 NOTE — Assessment & Plan Note (Signed)
overcontrolled by hx, will need to continue metformin 1000 bid, but decrease the glipizide ER 5 mg to the 2.5 mg per day, continue to moniotor CBGs and  to f/u any worsening symptoms or concerns

## 2022-01-28 NOTE — Progress Notes (Signed)
Patient ID: [name] Gross, male   DOB: August 03, 1961, 61 y.o.   MRN: [id number]        Chief Complaint: follow up HTN, HLD and DM       HPI:  [name] Gross is a 61 y.o. male here overall doing ok  Pt denies chest pain, increased sob or doe, wheezing, orthopnea, PND, increased LE swelling, palpitations, dizziness or syncope.   Pt denies polydipsia, polyuria, but has had recent frequent low sugar symptoms with low CBG < 100 often mid morning and mid afternoon, with good med compliance, though wt increase has been a problem with less diet compliance as well.   Pt denies fever, wt loss, night sweats, loss of appetite, or other constitutional symptoms  no other new complaints       Wt Readings from Last 3 Encounters:  01/28/22 181 lb 2 oz (82.2 kg)  07/28/21 175 lb (79.4 kg)  01/23/21 175 lb (79.4 kg)   BP Readings from Last 3 Encounters:  01/28/22 110/78  07/28/21 120/76  01/23/21 124/78         Past Medical History:  Diagnosis Date   Allergic rhinitis    Anxiety    Asthma    mild intermittent   CAD (coronary artery disease)    s/p INF STEMI 12/04/2010: treated with BMS to RCA c/b acute stent thrombosis at completion of  primary PCI ...above tx with second BMS with adjunctive thrombectomy residual CAD at cath 12/04/2010: pRCA 40-50%; LAD and CFX ok; EF 45% Echo 12/06/2010: Ef 55-65%; ?HK inferior wall; mild MR   Erectile dysfunction    GERD (gastroesophageal reflux disease)    Glucose intolerance (impaired glucose tolerance)    HTN (hypertension)    Obesity    OSA (obstructive sleep apnea)    mild, no CPAP per pt preference   Osteoarthritis    Prostate cancer [facility])    Past Surgical History:  Procedure Laterality Date   CORONARY STENT PLACEMENT     Stenting of the right coronary artery......... Successful percutaneous coronary intervention with  adjunctive thrombectomy for treatment of acute stent thrombosis and   reocclusion of the right coronary artery after treatment earlier today  of  an acute myocardial infarction   ELBOW SURGERY     KNEE SURGERY     NASAL SINUS SURGERY     SPERMATOCELECTOMY     WRIST SURGERY      reports that he quit smoking about 39 years ago. His smoking use included cigarettes. He has a 7.50 pack-year smoking history. He has never used smokeless tobacco. He reports that he does not drink alcohol and does not use drugs. family history includes Breast cancer in his maternal aunt; Cancer in his maternal aunt; Colon cancer in his maternal aunt; Coronary artery disease in his father; Diabetes in his father; Esophageal cancer in his mother; Heart disease in his father, maternal aunt, maternal grandfather, maternal grandmother, maternal uncle, and mother; Hip fracture in his father; Liver cancer in his maternal uncle; Prostate cancer in his brother. Allergies  Allergen Reactions   Amoxicillin     Per the pt "it was years ago and does not remember the reaction"   Current Outpatient Medications on File Prior to Visit  Medication Sig Dispense Refill   ASPIRIN LOW DOSE 81 MG EC tablet TAKE 1 TABLET BY MOUTH EVERY DAY 90 tablet 3   doxycycline (VIBRA-TABS) 100 MG tablet Take 1 tablet (100 mg total) by mouth 2 (two) times daily. [place]  tablet 0   escitalopram (LEXAPRO) 10 MG tablet TAKE 1 TABLET BY MOUTH EVERY DAY 90 tablet 2   Magnesium 500 MG CAPS Take 1 capsule by mouth daily.      metFORMIN (GLUCOPHAGE-XR) 500 MG 24 hr tablet Take 2 tablets (1,000 mg total) by mouth daily with breakfast. 180 tablet 3   nitroGLYCERIN (NITROSTAT) 0.4 MG SL tablet Place 1 tablet (0.4 mg total) under the tongue every 5 (five) minutes as needed for chest pain. 25 tablet 3   rosuvastatin (CRESTOR) 5 MG tablet Take 1 tablet (5 mg total) by mouth daily at 6 PM. Please make yearly appt with Dr. Burt Knack for January 2023 for future refills. Thank you 1st attempt 90 tablet 0   traMADol (ULTRAM) 50 MG tablet TAKE 1 TABLET BY MOUTH TWICE A DAY AS NEEDED FOR PAIN 60 tablet 2   No current  facility-administered medications on file prior to visit.        ROS:  All others reviewed and negative.  Objective        PE:  BP 110/78    Pulse 78    Temp 97.8 F (36.6 C)    Ht 5\' 4"  (1.626 m)    Wt 181 lb 2 oz (82.2 kg)    SpO2 98%    BMI 31.09 kg/m                 Constitutional: Pt appears in NAD               HENT: Head: NCAT.                Right Ear: External ear normal.                 Left Ear: External ear normal.                Eyes: . Pupils are equal, round, and reactive to light. Conjunctivae and EOM are normal               Nose: without d/c or deformity               Neck: Neck supple. Gross normal ROM               Cardiovascular: Normal rate and regular rhythm.                 Pulmonary/Chest: Effort normal and breath sounds without rales or wheezing.                Abd:  Soft, NT, ND, + BS, no organomegaly               Neurological: Pt is alert. At baseline orientation, motor grossly intact               Skin: Skin is warm. No rashes, no other new lesions, LE edema - none               Psychiatric: Pt behavior is normal without agitation   Micro: none  Cardiac tracings I have personally interpreted today:  none  Pertinent Radiological findings (summarize): none   Lab Results  Component Value Date   WBC 5.9 07/28/2021   HGB 14.9 07/28/2021   HCT 44.9 07/28/2021   PLT 297.0 07/28/2021   GLUCOSE 127 (H) 07/28/2021   CHOL 146 07/28/2021   TRIG 226.0 (H) 07/28/2021   HDL 48.50 07/28/2021   LDLDIRECT 64.0 07/28/2021   LDLCALC 65 07/22/2020  ALT 37 07/28/2021   AST 27 07/28/2021   NA 138 07/28/2021   K 4.4 07/28/2021   CL 101 07/28/2021   CREATININE 1.40 07/28/2021   BUN 18 07/28/2021   CO2 30 07/28/2021   TSH 2.58 07/28/2021   PSA 0.07 (L) 07/28/2021   INR 1.00 12/22/2010   HGBA1C 6.6 (H) 07/28/2021   MICROALBUR <0.7 07/28/2021   Assessment/Plan:  [name] is a 61 y.o. White or Caucasian [1] male with  has a past medical history of  Allergic rhinitis, Anxiety, Asthma, CAD (coronary artery disease), Erectile dysfunction, GERD (gastroesophageal reflux disease), Glucose intolerance (impaired glucose tolerance), HTN (hypertension), Obesity, OSA (obstructive sleep apnea), Osteoarthritis, and Prostate cancer ([place]).  Diabetes mellitus ([place]) overcontrolled by hx, will need to continue metformin 1000 bid, but decrease the glipizide ER 5 mg to the 2.5 mg per day, continue to moniotor CBGs and  to f/u any worsening symptoms or concerns  Asthma due to environmental allergies Stable overall, to continue inhaler prn  Elevated blood pressure reading without diagnosis of hypertension BP Readings from Last 3 Encounters:  01/28/22 110/78  07/28/21 120/76  01/23/21 124/78   Stable, pt to continue medical treatment  - diet, wt control   Vitamin D deficiency Last vitamin D Lab Results  Component Value Date   VD25OH 42.05 07/28/2021   Stable, cont oral replacement   Hypertriglyceridemia Lab Results  Component Value Date   CHOL 146 07/28/2021   HDL 48.50 07/28/2021   LDLCALC 65 07/22/2020   LDLDIRECT 64.0 07/28/2021   TRIG 226.0 (H) 07/28/2021   CHOLHDL 3 07/28/2021  also for low fat/DM diet  Followup: Return in about 6 months (around 07/28/2022).  Cathlean Cower, MD 01/28/2022 1:19 PM [facility]

## 2022-01-28 NOTE — Assessment & Plan Note (Signed)
Last vitamin D Lab Results  Component Value Date   VD25OH 42.05 07/28/2021   Stable, cont oral replacement  

## 2022-02-03 ENCOUNTER — Other Ambulatory Visit: Payer: Self-pay | Admitting: Internal Medicine

## 2022-02-17 ENCOUNTER — Other Ambulatory Visit: Payer: Self-pay | Admitting: Cardiovascular Disease

## 2022-02-18 ENCOUNTER — Other Ambulatory Visit: Payer: Self-pay | Admitting: Cardiovascular Disease

## 2022-02-25 ENCOUNTER — Other Ambulatory Visit: Payer: Self-pay | Admitting: Cardiovascular Disease

## 2022-03-01 ENCOUNTER — Telehealth: Payer: Self-pay | Admitting: Cardiovascular Disease

## 2022-03-01 LAB — HM DIABETES EYE EXAM

## 2022-03-01 MED ORDER — ROSUVASTATIN CALCIUM 5 MG PO TABS: 5.0000 mg | ORAL_TABLET | Freq: Every day | ORAL | 3 refills | Status: DC

## 2022-03-01 NOTE — Telephone Encounter (Signed)
? ?*  STAT* If patient is at the pharmacy, call can be transferred to refill team. ? ? ?1. Which medications need to be refilled? (please list name of each medication and dose if known)  ? ?rosuvastatin (CRESTOR) 5 MG tablet ? ?2. Which pharmacy/location (including street and city if local pharmacy) is medication to be sent to? ? ?[id number] [address]? ?3. Do they need a 30 day or 90 day supply?  90 ? ?Patient has appt with Dr CBurt Knackon 07/05/22 ?

## 2022-03-01 NOTE — Telephone Encounter (Signed)
Pt's medication was sent to pt's pharmacy as requested. Confirmation received.  °

## 2022-03-13 ENCOUNTER — Other Ambulatory Visit: Payer: Self-pay | Admitting: Cardiovascular Disease

## 2022-03-22 ENCOUNTER — Other Ambulatory Visit: Payer: Self-pay | Admitting: Internal Medicine

## 2022-03-22 NOTE — Telephone Encounter (Signed)
Please refill as per office routine med refill policy (all routine meds to be refilled for 3 mo or monthly (per pt preference) up to one year from last visit, then month to month grace period for 3 mo, then further med refills will have to be denied) ? ?

## 2022-03-24 ENCOUNTER — Telehealth: Payer: Self-pay | Admitting: Cardiovascular Disease

## 2022-03-24 MED ORDER — NITROGLYCERIN 0.4 MG SL SUBL: 0.4000 mg | SUBLINGUAL_TABLET | SUBLINGUAL | 0 refills | Status: DC | PRN

## 2022-03-24 NOTE — Telephone Encounter (Signed)
Pt has appointment in July 2023 with Dr. Burt Knack, sent in 90 day refill for Nitroglycerin. ?

## 2022-03-24 NOTE — Telephone Encounter (Signed)
? ?*  STAT* If patient is at the pharmacy, call can be transferred to refill team. ? ? ?1. Which medications need to be refilled? (please list name of each medication and dose if known)  ? ?nitroGLYCERIN (NITROSTAT) 0.4 MG SL tablet ? ?2. Which pharmacy/location (including street and city if local pharmacy) is medication to be sent to? ? ?[id number] [address]? ?3. Do they need a 30 day or 90 day supply?  30 ?

## 2022-03-26 ENCOUNTER — Encounter: Payer: Self-pay | Admitting: Cardiovascular Disease

## 2022-03-26 ENCOUNTER — Ambulatory Visit: Payer: BC Managed Care – PPO | Admitting: Cardiovascular Disease

## 2022-03-26 VITALS — BP 114/72 | HR 62 | Ht 64.0 in | Wt 179.6 lb

## 2022-03-26 DIAGNOSIS — E782 Mixed hyperlipidemia: Secondary | ICD-10-CM

## 2022-03-26 DIAGNOSIS — I10 Essential (primary) hypertension: Secondary | ICD-10-CM | POA: Diagnosis not present

## 2022-03-26 DIAGNOSIS — I2510 Atherosclerotic heart disease of native coronary artery without angina pectoris: Secondary | ICD-10-CM

## 2022-03-26 NOTE — Progress Notes (Signed)
?Cardiology Office Note:   ? ?Date:  03/26/2022  ? ?ID:  [name] Gross, DOB 11-02-1961, MRN [id number] ? ?PCP:  Biagio Borg, MD ?  ?[facility] ?Cardiologist:  Sherren Mocha, MD    ? ?Referring MD: Biagio Borg, MD  ? ?Chief Complaint  ?Patient presents with  ? Coronary Artery Disease  ? ? ?History of Present Illness:   ? ?[name] Gross is a 61 y.o. male with a hx of coronary artery disease, prostate cancer, hypertension, and mixed hyperlipidemia.  The patient initially presented in 2011 with an acute inferior wall MI treated with bare-metal stenting of the right coronary artery.  His course was complicated by acute stent thrombosis with need for placement of a second stent.  He has done well in the interim with no interval ischemic events and preserved LVEF of 55 to 65%.  The patient underwent total prostatectomy in 2008 and required radiation therapy for increasing PSA in 2019. ? ?The patient is here alone today.  He has retired from his work with the state of [place] and he is really enjoying retirement.  He notes that his blood pressure is running in a great range now and he has not required any antihypertensive medication.  He is detailing cars.  He plays a good bit of golf.  He denies any exertional symptoms.  He specifically denies chest pain, chest pressure, shortness of breath, or heart palpitations. ? ?Past Medical History:  ?Diagnosis Date  ? Allergic rhinitis   ? Anxiety   ? Asthma   ? mild intermittent  ? CAD (coronary artery disease)   ? s/p INF STEMI 12/04/2010: treated with BMS to RCA c/b acute stent thrombosis at completion of  primary PCI ...above tx with second BMS with adjunctive thrombectomy residual CAD at cath 12/04/2010: pRCA 40-50%; LAD and CFX ok; EF 45% Echo 12/06/2010: Ef 55-65%; ?HK inferior wall; mild MR  ? Erectile dysfunction   ? GERD (gastroesophageal reflux disease)   ? Glucose intolerance (impaired glucose tolerance)   ? HTN (hypertension)   ? Obesity   ? OSA  (obstructive sleep apnea)   ? mild, no CPAP per pt preference  ? Osteoarthritis   ? Prostate cancer ([place])   ? ? ?Past Surgical History:  ?Procedure Laterality Date  ? CORONARY STENT PLACEMENT    ? Stenting of the right coronary artery......... Successful percutaneous coronary intervention with  adjunctive thrombectomy for treatment of acute stent thrombosis and   reocclusion of the right coronary artery after treatment earlier today  of an acute myocardial infarction  ? ELBOW SURGERY    ? KNEE SURGERY    ? NASAL SINUS SURGERY    ? SPERMATOCELECTOMY    ? WRIST SURGERY    ? ? ?Current Medications: ?Current Meds  ?Medication Sig  ? aspirin ([facility] ASPIRIN LOW DOSE) 81 MG EC tablet Take 1 tablet (81 mg total) by mouth daily.  ? escitalopram (LEXAPRO) 10 MG tablet TAKE 1 TABLET BY MOUTH EVERY DAY  ? glipiZIDE (GLIPIZIDE XL) 2.5 MG 24 hr tablet Take 1 tablet (2.5 mg total) by mouth daily with breakfast.  ? Magnesium 500 MG CAPS Take 1 capsule by mouth daily.   ? metFORMIN (GLUCOPHAGE-XR) 500 MG 24 hr tablet TAKE 2 TABLETS BY MOUTH EVERY DAY WITH BREAKFAST  ? nitroGLYCERIN (NITROSTAT) 0.4 MG SL tablet Place 1 tablet (0.4 mg total) under the tongue every 5 (five) minutes as needed for chest pain.  ? rosuvastatin (CRESTOR) 5  MG tablet Take 1 tablet (5 mg total) by mouth daily. Please keep upcoming appt in July 2023 with Dr. Burt Knack before anymore refills. Thank you Final Attempt  ? traMADol (ULTRAM) 50 MG tablet TAKE 1 TABLET BY MOUTH TWICE A DAY AS NEEDED FOR PAIN  ?  ? ?Allergies:   Amoxicillin  ? ?Social History  ? ?Socioeconomic History  ? Marital status: Married  ?  Spouse name: Not on file  ? Number of children: 2  ? Years of education: Not on file  ? Highest education level: Not on file  ?Occupational History  ?  Comment: full time  ?Tobacco Use  ? Smoking status: Former  ?  Packs/day: 0.50  ?  Years: 15.00  ?  Pack years: 7.50  ?  Types: Cigarettes  ?  Quit date: 10/20/1982  ?  Years since quitting: 39.4  ? Smokeless  tobacco: Never  ?Vaping Use  ? Vaping Use: Never used  ?Substance and Sexual Activity  ? Alcohol use: No  ? Drug use: No  ? Sexual activity: Yes  ?Other Topics Concern  ? Not on file  ?Social History Narrative  ? 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 Resides in [place] with wife. Has one son and one daughter.  ? ?Social Determinants of Health  ? ?Financial Resource Strain: Not on file  ?Food Insecurity: Not on file  ?Transportation Needs: Not on file  ?Physical Activity: Not on file  ?Stress: Not on file  ?Social Connections: Not on file  ?  ? ?Family History: ?The patient's family history includes Breast cancer in his maternal aunt; Cancer in his maternal aunt; Colon cancer in his maternal aunt; Coronary artery disease in his father; Diabetes in his father; Esophageal cancer in his mother; Heart disease in his father, maternal aunt, maternal grandfather, maternal grandmother, maternal uncle, and mother; Hip fracture in his father; Liver cancer in his maternal uncle; Prostate cancer in his brother. There is no history of Rectal cancer or Stomach cancer. ? ?ROS:   ?Please see the history of present illness.    ?All other systems reviewed and are negative. ? ?EKGs/Labs/Other Studies Reviewed:   ? ?EKG:  EKG is ordered today.  The ekg ordered today demonstrates normal sinus rhythm 62 bpm, age-indeterminate inferior MI, no significant change from old tracings. ? ?Recent Labs: ?07/28/2021: Hemoglobin 14.9; Platelets 297.0; TSH 2.58 ?01/28/2022: ALT 54; BUN 15; Creatinine, Ser 1.21; Potassium 3.9; Sodium 139  ?Recent Lipid Panel ?   ?Component Value Date/Time  ? CHOL 144 01/28/2022 1108  ? TRIG 242.0 (H) 01/28/2022 1108  ? TRIG 241 (HH) 10/31/2006 0917  ? HDL 48.30 01/28/2022 1108  ? CHOLHDL 3 01/28/2022 1108  ? VLDL 48.4 (H) 01/28/2022 1108  ? [place] 65 07/22/2020 1112  ? LDLDIRECT 67.0 01/28/2022 1108  ? ? ? ?Risk Assessment/Calculations:   ?  ? ?    ? ?Physical Exam:   ? ?VS:  BP 114/72   Pulse 62   Ht '5\' 4"'$  (1.626 m)   Wt  179 lb 9.6 oz (81.5 kg)   SpO2 96%   BMI 30.83 kg/m?    ? ?Wt Readings from Last 3 Encounters:  ?03/26/22 179 lb 9.6 oz (81.5 kg)  ?01/28/22 181 lb 2 oz (82.2 kg)  ?07/28/21 175 lb (79.4 kg)  ?  ? ?GEN:  Well nourished, well developed in no acute distress ?HEENT: Normal ?NECK: No JVD; No carotid bruits ?LYMPHATICS: No lymphadenopathy ?CARDIAC: RRR, no murmurs, rubs, gallops ?RESPIRATORY:  Clear to  auscultation without rales, wheezing or rhonchi  ?ABDOMEN: Soft, non-tender, non-distended ?MUSCULOSKELETAL:  No edema; No deformity  ?SKIN: Warm and dry ?NEUROLOGIC:  Alert and oriented x 3 ?PSYCHIATRIC:  Normal affect  ? ?ASSESSMENT:   ? ?1. Coronary artery disease involving native coronary artery of native heart without angina pectoris   ?2. Essential hypertension   ?3. Mixed hyperlipidemia   ? ?PLAN:   ? ?In order of problems listed above: ? ?Patient is clinically stable with no angina or functional limitation.  He remains on aspirin for antiplatelet therapy, a low-dose statin drug limited by drug intolerance.  Seems to be doing very well.  I will see him back in 1 year.  We discussed lifestyle modification at length today with a focus on need for increased exercise and weight loss as well as a low glycemic diet. ?Blood pressure has improved since he retired.  Not currently requiring any antihypertensive therapy. ?Treated with rosuvastatin 5 mg daily.  Labs reviewed demonstrating a cholesterol 144, LDL 65, HDL 48.  Triglycerides are elevated at 242.  We discussed that his elevated triglycerides are likely linked to his glycemic control.  He will work on treatment of his type 2 diabetes focus on lifestyle modification.  No medication changes are made today. ? ?   ? ?   ? ? ?Medication Adjustments/Labs and Tests Ordered: ?Current medicines are reviewed at length with the patient today.  Concerns regarding medicines are outlined above.  ?No orders of the defined types were placed in this encounter. ? ?No orders of the  defined types were placed in this encounter. ? ? ?There are no Patient Instructions on file for this visit.  ? ?Signed, ?Sherren Mocha, MD  ?03/26/2022 10:42 AM    ?[place] ?

## 2022-03-26 NOTE — Patient Instructions (Signed)
Medication Instructions:  ?Your physician recommends that you continue on your current medications as directed. Please refer to the Current Medication list given to you today. ? ?*If you need a refill on your cardiac medications before your next appointment, please call your pharmacy* ? ? ?Lab Work: ?NONE ?If you have labs (blood work) drawn today and your tests are completely normal, you will receive your results only by: ?MyChart Message (if you have MyChart) OR ?A paper copy in the mail ?If you have any lab test that is abnormal or we need to change your treatment, we will call you to review the results. ? ? ?Testing/Procedures: ?NONE ? ? ?Follow-Up: ?At [facility], you and your health needs are our priority.  As part of our continuing mission to provide you with exceptional heart care, we have created designated Provider Care Teams.  These Care Teams include your primary Cardiologist (physician) and Advanced Practice Providers (APPs -  Physician Assistants and Nurse Practitioners) who all work together to provide you with the care you need, when you need it. ? ?Your next appointment:   ?1 year(s) ? ?The format for your next appointment:   ?In Person ? ?Provider:   ?Sherren Mocha, MD   ? ?  ?

## 2022-06-13 ENCOUNTER — Other Ambulatory Visit: Payer: Self-pay | Admitting: Cardiovascular Disease

## 2022-06-17 ENCOUNTER — Other Ambulatory Visit: Payer: Self-pay | Admitting: Internal Medicine

## 2022-06-17 ENCOUNTER — Other Ambulatory Visit: Payer: Self-pay | Admitting: Cardiovascular Disease

## 2022-07-05 ENCOUNTER — Ambulatory Visit: Payer: BC Managed Care – PPO | Admitting: Cardiovascular Disease

## 2022-07-29 ENCOUNTER — Ambulatory Visit: Payer: BC Managed Care – PPO | Admitting: Internal Medicine

## 2022-07-29 ENCOUNTER — Encounter: Payer: Self-pay | Admitting: Internal Medicine

## 2022-07-29 VITALS — BP 120/68 | HR 62 | Temp 97.6°F | Ht 64.0 in | Wt 179.4 lb

## 2022-07-29 DIAGNOSIS — Z0001 Encounter for general adult medical examination with abnormal findings: Secondary | ICD-10-CM

## 2022-07-29 DIAGNOSIS — E785 Hyperlipidemia, unspecified: Secondary | ICD-10-CM

## 2022-07-29 DIAGNOSIS — J309 Allergic rhinitis, unspecified: Secondary | ICD-10-CM

## 2022-07-29 DIAGNOSIS — E1165 Type 2 diabetes mellitus with hyperglycemia: Secondary | ICD-10-CM | POA: Diagnosis not present

## 2022-07-29 DIAGNOSIS — E538 Deficiency of other specified B group vitamins: Secondary | ICD-10-CM

## 2022-07-29 DIAGNOSIS — Z0000 Encounter for general adult medical examination without abnormal findings: Secondary | ICD-10-CM

## 2022-07-29 DIAGNOSIS — E559 Vitamin D deficiency, unspecified: Secondary | ICD-10-CM | POA: Diagnosis not present

## 2022-07-29 DIAGNOSIS — E1142 Type 2 diabetes mellitus with diabetic polyneuropathy: Secondary | ICD-10-CM | POA: Diagnosis not present

## 2022-07-29 DIAGNOSIS — Z23 Encounter for immunization: Secondary | ICD-10-CM

## 2022-07-29 LAB — CBC WITH DIFFERENTIAL/PLATELET
Basophils Absolute: 0.1 10*3/uL (ref 0.0–0.1)
Basophils Relative: 1.4 % (ref 0.0–3.0)
Eosinophils Absolute: 0.6 10*3/uL (ref 0.0–0.7)
Eosinophils Relative: 8.6 % — ABNORMAL HIGH (ref 0.0–5.0)
HCT: 44.8 % (ref 39.0–52.0)
Hemoglobin: 15.2 g/dL (ref 13.0–17.0)
Lymphocytes Relative: 20.7 % (ref 12.0–46.0)
Lymphs Abs: 1.4 10*3/uL (ref 0.7–4.0)
MCHC: 34 g/dL (ref 30.0–36.0)
MCV: 86.8 fl (ref 78.0–100.0)
Monocytes Absolute: 0.6 10*3/uL (ref 0.1–1.0)
Monocytes Relative: 9 % (ref 3.0–12.0)
Neutro Abs: 4.1 10*3/uL (ref 1.4–7.7)
Neutrophils Relative %: 60.3 % (ref 43.0–77.0)
Platelets: 280 10*3/uL (ref 150.0–400.0)
RBC: 5.16 Mil/uL (ref 4.22–5.81)
RDW: 13 % (ref 11.5–15.5)
WBC: 6.9 10*3/uL (ref 4.0–10.5)

## 2022-07-29 LAB — BASIC METABOLIC PANEL
BUN: 18 mg/dL (ref 6–23)
CO2: 29 mEq/L (ref 19–32)
Calcium: 9.7 mg/dL (ref 8.4–10.5)
Chloride: 102 mEq/L (ref 96–112)
Creatinine, Ser: 1.43 mg/dL (ref 0.40–1.50)
GFR: 52.93 mL/min — ABNORMAL LOW (ref >60.00)
Glucose, Bld: 97 mg/dL (ref 70–99)
Potassium: 4.1 mEq/L (ref 3.5–5.1)
Sodium: 139 mEq/L (ref 135–145)

## 2022-07-29 LAB — URINALYSIS, ROUTINE W REFLEX MICROSCOPIC
Bilirubin Urine: NEGATIVE
Hgb urine dipstick: NEGATIVE
Ketones, ur: NEGATIVE
Leukocytes,Ua: NEGATIVE
Nitrite: NEGATIVE
RBC / HPF: NONE SEEN (ref 0–?)
Specific Gravity, Urine: 1.02 (ref 1.000–1.030)
Total Protein, Urine: NEGATIVE
Urine Glucose: 100 — AB
Urobilinogen, UA: 0.2 (ref 0.0–1.0)
WBC, UA: NONE SEEN (ref 0–?)
pH: 6 (ref 5.0–8.0)

## 2022-07-29 LAB — HEPATIC FUNCTION PANEL
ALT: 40 U/L (ref 0–53)
AST: 27 U/L (ref 0–37)
Albumin: 4.6 g/dL (ref 3.5–5.2)
Alkaline Phosphatase: 106 U/L (ref 39–117)
Bilirubin, Direct: 0.1 mg/dL (ref 0.0–0.3)
Total Bilirubin: 0.5 mg/dL (ref 0.2–1.2)
Total Protein: 6.6 g/dL (ref 6.0–8.3)

## 2022-07-29 LAB — HEMOGLOBIN A1C: Hgb A1c MFr Bld: 7.7 % — ABNORMAL HIGH (ref 4.6–6.5)

## 2022-07-29 LAB — TSH: TSH: 1.95 u[IU]/mL (ref 0.35–5.50)

## 2022-07-29 LAB — VITAMIN B12: Vitamin B-12: 336 pg/mL (ref 211–911)

## 2022-07-29 LAB — MICROALBUMIN / CREATININE URINE RATIO
Creatinine,U: 112.6 mg/dL
Microalb Creat Ratio: 0.6 mg/g (ref 0.0–30.0)
Microalb, Ur: <0.7 mg/dL (ref 0.0–1.9)

## 2022-07-29 LAB — PSA: PSA: 0.06 ng/mL — ABNORMAL LOW (ref 0.10–4.00)

## 2022-07-29 LAB — LIPID PANEL
Cholesterol: 130 mg/dL (ref 0–200)
HDL: 47.2 mg/dL (ref >39.00)
LDL Cholesterol: 44 mg/dL (ref 0–99)
NonHDL: 82.62
Total CHOL/HDL Ratio: 3
Triglycerides: 193 mg/dL — ABNORMAL HIGH (ref 0.0–149.0)
VLDL: 38.6 mg/dL (ref 0.0–40.0)

## 2022-07-29 LAB — VITAMIN D 25 HYDROXY (VIT D DEFICIENCY, FRACTURES): VITD: 28.66 ng/mL — ABNORMAL LOW (ref 30.00–100.00)

## 2022-07-29 MED ORDER — METHYLPREDNISOLONE ACETATE 80 MG/ML IJ SUSP
80.0000 mg | Freq: Once | INTRAMUSCULAR | Status: AC
  Administered 2022-07-29: 80 mg via INTRAMUSCULAR

## 2022-07-29 MED ORDER — RYBELSUS 3 MG PO TABS: 3.0000 mg | ORAL_TABLET | Freq: Every day | ORAL | 3 refills | Status: DC

## 2022-07-29 MED ORDER — GABAPENTIN 100 MG PO CAPS: 100.0000 mg | ORAL_CAPSULE | Freq: Three times a day (TID) | ORAL | 5 refills | Status: DC

## 2022-07-29 NOTE — Progress Notes (Signed)
Patient ID: FIDENCIO DUDDY, male   DOB: 12/01/1961, 61 y.o.   MRN: [id number]         Chief Complaint:: wellness exam and allergies, neuropathy, DM and obesity, hld       HPI:  [name] Gross is a 61 y.o. male here for wellness exam; due for shingrix, o/w up to date                        Also Does have several wks ongoing nasal allergy symptoms with clearish congestion, itch and sneezing, without fever, pain, ST, cough, swelling or wheezing   Neuropathy pain worseing as well now about 6/10, chronic persistent constant, burning, nothing makes better or worse.  Has been hard to lose wt with better diet and activity alone.    Wt Readings from Last 3 Encounters:  07/29/22 179 lb 6.4 oz (81.4 kg)  03/26/22 179 lb 9.6 oz (81.5 kg)  01/28/22 181 lb 2 oz (82.2 kg)   BP Readings from Last 3 Encounters:  07/29/22 120/68  03/26/22 114/72  01/28/22 110/78   Immunization History  Administered Date(s) Administered   Influenza Split 11/18/2011   Influenza Whole 10/15/2008, 10/17/2009, 09/17/2010   Influenza, Seasonal, Injecte, Preservative Fre 11/20/2012   Influenza,inj,Quad PF,6+ Mos 10/11/2014, 10/07/2015, 10/14/2016, 10/17/2017, 10/12/2019, 10/13/2021   Influenza,inj,quad, With Preservative 11/19/2019   Influenza-Unspecified 09/27/2018, 11/19/2020   PFIZER(Purple Top)SARS-COV-2 Vaccination 03/03/2020, 03/31/2020, 10/22/2020   Pneumococcal Conjugate-13 04/26/2018   Pneumococcal Polysaccharide-23 07/22/2020   Td 12/21/1995   Tdap 04/08/2017   Zoster Recombinat (Shingrix) 07/29/2022   There are no preventive care reminders to display for this patient.     Past Medical History:  Diagnosis Date   Allergic rhinitis    Anxiety    Asthma    mild intermittent   CAD (coronary artery disease)    s/p INF STEMI 12/04/2010: treated with BMS to RCA c/b acute stent thrombosis at completion of  primary PCI ...above tx with second BMS with adjunctive thrombectomy residual CAD at cath 12/04/2010: pRCA  40-50%; LAD and CFX ok; EF 45% Echo 12/06/2010: Ef 55-65%; ?HK inferior wall; mild MR   Erectile dysfunction    GERD (gastroesophageal reflux disease)    Glucose intolerance (impaired glucose tolerance)    HTN (hypertension)    Obesity    OSA (obstructive sleep apnea)    mild, no CPAP per pt preference   Osteoarthritis    Prostate cancer [facility]    Past Surgical History:  Procedure Laterality Date   CORONARY STENT PLACEMENT     Stenting of the right coronary artery......... Successful percutaneous coronary intervention with  adjunctive thrombectomy for treatment of acute stent thrombosis and   reocclusion of the right coronary artery after treatment earlier today  of an acute myocardial infarction   ELBOW SURGERY     KNEE SURGERY     NASAL SINUS SURGERY     SPERMATOCELECTOMY     WRIST SURGERY      reports that he quit smoking about 39 years ago. His smoking use included cigarettes. He has a 7.50 pack-year smoking history. He has never used smokeless tobacco. He reports that he does not drink alcohol and does not use drugs. family history includes Breast cancer in his maternal aunt; Cancer in his maternal aunt; Colon cancer in his maternal aunt; Coronary artery disease in his father; Diabetes in his father; Esophageal cancer in his mother; Heart disease in his father, maternal aunt, maternal grandfather, maternal  grandmother, maternal uncle, and mother; Hip fracture in his father; Liver cancer in his maternal uncle; Prostate cancer in his brother. Allergies  Allergen Reactions   Amoxicillin     Per the pt "it was years ago and does not remember the reaction"   Current Outpatient Medications on File Prior to Visit  Medication Sig Dispense Refill   aspirin EC 81 MG tablet TAKE 1 TABLET BY MOUTH EVERY DAY 90 tablet 3   escitalopram (LEXAPRO) 10 MG tablet TAKE 1 TABLET BY MOUTH EVERY DAY 90 tablet 2   glipiZIDE (GLIPIZIDE XL) 2.5 MG 24 hr tablet Take 1 tablet (2.5 mg total) by mouth daily  with breakfast. 90 tablet 3   metFORMIN (GLUCOPHAGE-XR) 500 MG 24 hr tablet TAKE 2 TABLETS BY MOUTH EVERY DAY WITH BREAKFAST 180 tablet 3   nitroGLYCERIN (NITROSTAT) 0.4 MG SL tablet PLACE 1 TABLET UNDER THE TONGUE EVERY 5 MINUTES AS NEEDED FOR CHEST PAIN 25 tablet 10   rosuvastatin (CRESTOR) 5 MG tablet Take 1 tablet (5 mg total) by mouth daily. Please keep upcoming appt in July 2023 with Dr. Burt Knack before anymore refills. Thank you Final Attempt 30 tablet 3   traMADol (ULTRAM) 50 MG tablet TAKE 1 TABLET BY MOUTH TWICE A DAY AS NEEDED FOR PAIN 60 tablet 2   No current facility-administered medications on file prior to visit.        ROS:  All others reviewed and negative.  Objective        PE:  BP 120/68 (BP Location: Right Arm, Patient Position: Sitting, Cuff Size: Large)   Pulse 62   Temp 97.6 F (36.4 C) (Oral)   Ht '5\' 4"'$  (1.626 m)   Wt 179 lb 6.4 oz (81.4 kg)   SpO2 97%   BMI 30.79 kg/m                 Constitutional: Pt appears in NAD               HENT: Head: NCAT.                Right Ear: External ear normal.                 Left Ear: External ear normal.                Eyes: . Pupils are equal, round, and reactive to light. Conjunctivae and EOM are normal               Nose: without d/c or deformity               Neck: Neck supple. Gross normal ROM               Cardiovascular: Normal rate and regular rhythm.                 Pulmonary/Chest: Effort normal and breath sounds without rales or wheezing.                Abd:  Soft, NT, ND, + BS, no organomegaly               Neurological: Pt is alert. At baseline orientation, motor grossly intact               Skin: Skin is warm. No rashes, no other new lesions, LE edema - none               Psychiatric: Pt behavior is normal without agitation  Micro: none  Cardiac tracings I have personally interpreted today:  none  Pertinent Radiological findings (summarize): none   Lab Results  Component Value Date   WBC 6.9  07/29/2022   HGB 15.2 07/29/2022   HCT 44.8 07/29/2022   PLT 280.0 07/29/2022   GLUCOSE 97 07/29/2022   CHOL 130 07/29/2022   TRIG 193.0 (H) 07/29/2022   HDL 47.20 07/29/2022   LDLDIRECT 67.0 01/28/2022   LDLCALC 44 07/29/2022   ALT 40 07/29/2022   AST 27 07/29/2022   NA 139 07/29/2022   K 4.1 07/29/2022   CL 102 07/29/2022   CREATININE 1.43 07/29/2022   BUN 18 07/29/2022   CO2 29 07/29/2022   TSH 1.95 07/29/2022   PSA 0.06 (L) 07/29/2022   INR 1.00 12/22/2010   HGBA1C 7.7 (H) 07/29/2022   MICROALBUR <0.7 07/29/2022   Assessment/Plan:  JAMESPAUL SECRIST is a 61 y.o. White or Caucasian [1] male with  has a past medical history of Allergic rhinitis, Anxiety, Asthma, CAD (coronary artery disease), Erectile dysfunction, GERD (gastroesophageal reflux disease), Glucose intolerance (impaired glucose tolerance), HTN (hypertension), Obesity, OSA (obstructive sleep apnea), Osteoarthritis, and Prostate cancer ([place]).  Vitamin D deficiency Last vitamin D Lab Results  Component Value Date   VD25OH 42.05 07/28/2021   Stable, cont oral replacement   Encounter for well adult exam with abnormal findings Age and sex appropriate education and counseling updated with regular exercise and diet Referrals for preventative services - none needed Immunizations addressed - for shingrix #1 today, with #2 in 2 mo Smoking counseling  - none needed Evidence for depression or other mood disorder - none significant Most recent labs reviewed. I have personally reviewed and have noted: 1) the patient's medical and social history 2) The patient's current medications and supplements 3) The patient's height, weight, and BMI have been recorded in the chart   Dyslipidemia Lab Results  Component Value Date   LDLCALC 44 07/29/2022   Stable, pt to continue current statin crestor 5 mg   Diabetes mellitus ([place]) Lab Results  Component Value Date   HGBA1C 7.7 (H) 07/29/2022   Uncontrolled, goal a1c < 7, pt  to continue current medical treatment metformine ER 500 mg - 2 qd but change glipzide to rybelsus 3 mg qd if ok with insurance, to work on sugar and wt control   Diabetic peripheral neuropathy ([place]) With increased pain - for gabapentin 100 mg tid  Allergic rhinitis Mild to mod, for depomedrol 80 mg IM,  to f/u any worsening symptoms or concerns  Followup: Return in about 6 months (around 01/29/2023).  Cathlean Cower, MD 08/01/2022 1:34 PM [facility]

## 2022-07-29 NOTE — Patient Instructions (Addendum)
You had the Shingles shot #1 today  Please plan to have the Shingles shot #2 done here sometime in 2-6 months (or at your next appt in 6 mo)  You had the steroid shot today for the allergies; please also take OTC Allegra 180 mg per day, and Nasacort as directed for persistent symptoms  Please take all new medication as prescribed- the gabapentin 100 mg three times per day (and this can be increased if the 100 mg does not make you sleepy)  Ok to try to change the glipzide to Rybelsus 3 mg per day if ok with the insurance  Please continue all other medications as before, and refills have been done if requested.  Please have the pharmacy call with any other refills you may need.  Please continue your efforts at being more active, low cholesterol diet, and weight control.  You are otherwise up to date with prevention measures today.  Please keep your appointments with your specialists as you may have planned  Please go to the LAB at the blood drawing area for the tests to be done  You will be contacted by phone if any changes need to be made immediately.  Otherwise, you will receive a letter about your results with an explanation, but please check with MyChart first.  Please remember to sign up for MyChart if you have not done so, as this will be important to you in the future with finding out test results, communicating by private email, and scheduling acute appointments online when needed.  Please make an Appointment to return in 6 months, or sooner if needed

## 2022-07-29 NOTE — Assessment & Plan Note (Signed)
Last vitamin D Lab Results  Component Value Date   VD25OH 42.05 07/28/2021   Stable, cont oral replacement

## 2022-08-01 ENCOUNTER — Encounter: Payer: Self-pay | Admitting: Internal Medicine

## 2022-08-01 NOTE — Assessment & Plan Note (Signed)
Lab Results  Component Value Date   LDLCALC 44 07/29/2022   Stable, pt to continue current statin crestor 5 mg

## 2022-08-01 NOTE — Assessment & Plan Note (Signed)
With increased pain - for gabapentin 100 mg tid

## 2022-08-01 NOTE — Assessment & Plan Note (Signed)
Lab Results  Component Value Date   HGBA1C 7.7 (H) 07/29/2022   Uncontrolled, goal a1c < 7, pt to continue current medical treatment metformine ER 500 mg - 2 qd but change glipzide to rybelsus 3 mg qd if ok with insurance, to work on sugar and wt control

## 2022-08-01 NOTE — Assessment & Plan Note (Signed)
Mild to mod, for depomedrol 80 mg IM,  to f/u any worsening symptoms or concerns

## 2022-08-01 NOTE — Assessment & Plan Note (Signed)
Age and sex appropriate education and counseling updated with regular exercise and diet Referrals for preventative services - none needed Immunizations addressed - for shingrix #1 today, with #2 in 2 mo Smoking counseling  - none needed Evidence for depression or other mood disorder - none significant Most recent labs reviewed. I have personally reviewed and have noted: 1) the patient's medical and social history 2) The patient's current medications and supplements 3) The patient's height, weight, and BMI have been recorded in the chart

## 2022-09-01 ENCOUNTER — Encounter: Payer: Self-pay | Admitting: Gastroenterology

## 2022-09-15 ENCOUNTER — Other Ambulatory Visit: Payer: Self-pay | Admitting: Internal Medicine

## 2022-10-13 ENCOUNTER — Encounter: Payer: Self-pay | Admitting: Gastroenterology

## 2022-11-09 ENCOUNTER — Ambulatory Visit (AMBULATORY_SURGERY_CENTER): Payer: Self-pay

## 2022-11-09 VITALS — Ht 64.0 in | Wt 168.0 lb

## 2022-11-09 DIAGNOSIS — Z1211 Encounter for screening for malignant neoplasm of colon: Secondary | ICD-10-CM

## 2022-11-09 MED ORDER — NA SULFATE-K SULFATE-MG SULF 17.5-3.13-1.6 GM/177ML PO SOLN: 1.0000 | Freq: Once | ORAL | 0 refills | Status: AC

## 2022-11-09 NOTE — Progress Notes (Signed)

## 2022-11-19 ENCOUNTER — Other Ambulatory Visit: Payer: Self-pay | Admitting: Cardiovascular Disease

## 2022-11-25 ENCOUNTER — Encounter: Payer: BC Managed Care – PPO | Admitting: Gastroenterology

## 2022-12-22 ENCOUNTER — Other Ambulatory Visit: Payer: Self-pay | Admitting: Internal Medicine

## 2022-12-22 ENCOUNTER — Encounter: Payer: Self-pay | Admitting: Gastroenterology

## 2022-12-24 ENCOUNTER — Ambulatory Visit (AMBULATORY_SURGERY_CENTER): Payer: BC Managed Care – PPO | Admitting: Gastroenterology

## 2022-12-24 ENCOUNTER — Encounter: Payer: Self-pay | Admitting: Gastroenterology

## 2022-12-24 VITALS — BP 107/70 | HR 81 | Temp 97.8°F | Resp 15 | Ht 64.0 in | Wt 168.0 lb

## 2022-12-24 DIAGNOSIS — D125 Benign neoplasm of sigmoid colon: Secondary | ICD-10-CM

## 2022-12-24 DIAGNOSIS — Z1211 Encounter for screening for malignant neoplasm of colon: Secondary | ICD-10-CM

## 2022-12-24 DIAGNOSIS — K640 First degree hemorrhoids: Secondary | ICD-10-CM

## 2022-12-24 MED ORDER — SODIUM CHLORIDE 0.9 % IV SOLN: 500.0000 mL | Freq: Once | INTRAVENOUS | Status: DC

## 2022-12-24 NOTE — Progress Notes (Signed)
Report to PACU, RN, vss, BBS= Clear.  

## 2022-12-24 NOTE — Patient Instructions (Addendum)
Information on polyps and hemorrhoids given to you today. Also a handout on hemorrhoid banding.  Await pathology results. - Repeat colonoscopy for surveillance based on pathology results. - Return to GI office PRN. - Use fiber, for example Citrucel, Fibercon, Konsyl or Metamucil. - Internal hemorrhoids were noted on this study and may be amenable to hemorrhoid band ligation. If you are interested in further treatment of these hemorrhoids with band ligation, please contact my clinic to set up an appointment for evaluation and treatment  YOU HAD AN ENDOSCOPIC PROCEDURE TODAY AT [facility]:   Refer to the procedure report that was given to you for any specific questions about what was found during the examination.  If the procedure report does not answer your questions, please call your gastroenterologist to clarify.  If you requested that your care partner not be given the details of your procedure findings, then the procedure report has been included in a sealed envelope for you to review at your convenience later.  YOU SHOULD EXPECT: Some feelings of bloating in the abdomen. Passage of more gas than usual.  Walking can help get rid of the air that was put into your GI tract during the procedure and reduce the bloating. If you had a lower endoscopy (such as a colonoscopy or flexible sigmoidoscopy) you may notice spotting of blood in your stool or on the toilet paper. If you underwent a bowel prep for your procedure, you may not have a normal bowel movement for a few days.  Please Note:  You might notice some irritation and congestion in your nose or some drainage.  This is from the oxygen used during your procedure.  There is no need for concern and it should clear up in a day or so.  SYMPTOMS TO REPORT IMMEDIATELY:  Following lower endoscopy (colonoscopy or flexible sigmoidoscopy):  Excessive amounts of blood in the stool  Significant tenderness or worsening of abdominal  pains  Swelling of the abdomen that is new, acute  Fever of 100F or higher  For urgent or emergent issues, a gastroenterologist can be reached at any hour by calling [phone]. Do not use MyChart messaging for urgent concerns.    DIET:  We do recommend a small meal at first, but then you may proceed to your regular diet.  Drink plenty of fluids but you should avoid alcoholic beverages for 24 hours.  ACTIVITY:  You should plan to take it easy for the rest of today and you should NOT DRIVE or use heavy machinery until tomorrow (because of the sedation medicines used during the test).    FOLLOW UP: Our staff will call the number listed on your records the next business day following your procedure.  We will call around 7:15- 8:00 am to check on you and address any questions or concerns that you may have regarding the information given to you following your procedure. If we do not reach you, we will leave a message.     If any biopsies were taken you will be contacted by phone or by letter within the next 1-3 weeks.  Please call us at [phone] if you have not heard about the biopsies in 3 weeks.    SIGNATURES/CONFIDENTIALITY: You and/or your care partner have signed paperwork which will be entered into your electronic medical record.  These signatures attest to the fact that that the information above on your After Visit Summary has been reviewed and is understood.  Full responsibility of  the confidentiality of this discharge information lies with you and/or your care-partner. 

## 2022-12-24 NOTE — Progress Notes (Signed)
Called to room to assist during endoscopic procedure.  Patient ID and intended procedure confirmed with present staff. Received instructions for my participation in the procedure from the performing physician.  

## 2022-12-24 NOTE — Progress Notes (Signed)
VS completed by DT.  Pt's states no medical or surgical changes since previsit or office visit.  

## 2022-12-24 NOTE — Op Note (Signed)
[facility] Patient Name: [name] Gross Procedure Date: 12/24/2022 10:37 AM MRN: [id number] Endoscopist: Gerrit Heck , MD, [id number] Age: 62 Referring MD:  Date of Birth: 1961-12-11 Gender: Male Account #: [id number] Procedure:                Colonoscopy Indications:              Screening for colorectal malignant neoplasm (last                            colonoscopy was 10 years ago)                           Last colonoscopy was 07/2012 and normal. Otherwise,                            no recent GI symptoms. Medicines:                Monitored Anesthesia Care Procedure:                Pre-Anesthesia Assessment:                           - Prior to the procedure, a History and Physical                            was performed, and patient medications and                            allergies were reviewed. The patient's tolerance of                            previous anesthesia was also reviewed. The risks                            and benefits of the procedure and the sedation                            options and risks were discussed with the patient.                            All questions were answered, and informed consent                            was obtained. Prior Anticoagulants: The patient has                            taken no anticoagulant or antiplatelet agents. ASA                            Grade Assessment: II - A patient with mild systemic                            disease. After reviewing the risks and benefits,  the patient was deemed in satisfactory condition to                            undergo the procedure.                           After obtaining informed consent, the colonoscope                            was passed under direct vision. Throughout the                            procedure, the patient's blood pressure, pulse, and                            oxygen saturations were monitored continuously. The                             Olympus CF-HQ190L [id number]) Colonoscope was                            introduced through the anus and advanced to the the                            cecum, identified by appendiceal orifice and                            ileocecal valve. The colonoscopy was performed                            without difficulty. The patient tolerated the                            procedure well. The quality of the bowel                            preparation was good. The ileocecal valve,                            appendiceal orifice, and rectum were photographed. Scope In: 10:56:01 AM Scope Out: 11:14:52 AM Scope Withdrawal Time: 0 hours 15 minutes 13 seconds  Total Procedure Duration: 0 hours 18 minutes 51 seconds  Findings:                 The perianal and digital rectal examinations were                            normal.                           A 5 mm polyp was found in the distal sigmoid colon.                            The polyp was sessile. The polyp was removed with a  cold snare. Resection and retrieval were complete.                            Estimated blood loss was minimal.                           Non-bleeding internal hemorrhoids were found during                            retroflexion. The hemorrhoids were small.                           The exam was otherwise normal throughout the                            remainder of the colon. Complications:            No immediate complications. Estimated Blood Loss:     Estimated blood loss was minimal. Impression:               - One 5 mm polyp in the distal sigmoid colon,                            removed with a cold snare. Resected and retrieved.                           - Non-bleeding internal hemorrhoids. Recommendation:           - Patient has a contact number available for                            emergencies. The signs and symptoms of potential                            delayed  complications were discussed with the                            patient. Return to normal activities tomorrow.                            Written discharge instructions were provided to the                            patient.                           - Resume previous diet.                           - Continue present medications.                           - Await pathology results.                           - Repeat colonoscopy for surveillance based on  pathology results.                           - Return to GI office PRN.                           - Use fiber, for example Citrucel, Fibercon, Konsyl                            or Metamucil.                           - Internal hemorrhoids were noted on this study and                            may be amenable to hemorrhoid band ligation. If you                            are interested in further treatment of these                            hemorrhoids with band ligation, please contact my                            clinic to set up an appointment for evaluation and                            treatment. Gerrit Heck, MD 12/24/2022 11:23:22 AM

## 2022-12-24 NOTE — Progress Notes (Signed)
GASTROENTEROLOGY PROCEDURE H&P NOTE   Primary Care Physician: Biagio Borg, MD    Reason for Procedure:  Colon Cancer screening  Plan:    Colonoscopy  Patient is appropriate for endoscopic procedure(s) in the ambulatory ([facility]) setting.  The nature of the procedure, as well as the risks, benefits, and alternatives were carefully and thoroughly reviewed with the patient. Ample time for discussion and questions allowed. The patient understood, was satisfied, and agreed to proceed.     HPI: [name] Gross is a 62 y.o. male who presents for colonoscopy for routine Colon Cancer screening.  No active GI symptoms.  No known family history of colon cancer or related malignancy.  Patient is otherwise without complaints or active issues today.  Last colonoscopy was 07/2012 and normal.   Past Medical History:  Diagnosis Date   Allergic rhinitis    Allergy    Anxiety    Asthma    mild intermittent   CAD (coronary artery disease)    s/p INF STEMI 12/04/2010: treated with BMS to RCA c/b acute stent thrombosis at completion of  primary PCI ...above tx with second BMS with adjunctive thrombectomy residual CAD at cath 12/04/2010: pRCA 40-50%; LAD and CFX ok; EF 45% Echo 12/06/2010: Ef 55-65%; ?HK inferior wall; mild MR   Diabetes mellitus without complication (HCC)    Erectile dysfunction    GERD (gastroesophageal reflux disease)    Glucose intolerance (impaired glucose tolerance)    Myocardial infarction ([place]) 2011   2 within an hour   Obesity    OSA (obstructive sleep apnea)    mild, no CPAP per pt preference   Osteoarthritis    Prostate cancer [facility])    Sleep apnea     Past Surgical History:  Procedure Laterality Date   COLONOSCOPY  2013   CORONARY STENT PLACEMENT  2011   Stenting of the right coronary artery......... Successful percutaneous coronary intervention with  adjunctive thrombectomy for treatment of acute stent thrombosis and   reocclusion of the right coronary artery  after treatment earlier today  of an acute myocardial infarction   ELBOW SURGERY     KNEE SURGERY     NASAL SINUS SURGERY     SPERMATOCELECTOMY     WRIST SURGERY      Prior to Admission medications   Medication Sig Start Date End Date Taking? Authorizing Provider  aspirin EC 81 MG tablet TAKE 1 TABLET BY MOUTH EVERY DAY 06/14/22  Yes Sherren Mocha, MD  escitalopram (LEXAPRO) 10 MG tablet TAKE 1 TABLET BY MOUTH EVERY DAY 09/15/22  Yes Biagio Borg, MD  gabapentin (NEURONTIN) 100 MG capsule Take 1 capsule (100 mg total) by mouth 3 (three) times daily. 07/29/22  Yes Biagio Borg, MD  metFORMIN (GLUCOPHAGE-XR) 500 MG 24 hr tablet TAKE 2 TABLETS BY MOUTH EVERY DAY WITH BREAKFAST 03/23/22  Yes Biagio Borg, MD  rosuvastatin (CRESTOR) 5 MG tablet Take 1 tablet (5 mg total) by mouth daily. 11/19/22  Yes Sherren Mocha, MD  Semaglutide (RYBELSUS) 3 MG TABS Take 3 mg by mouth daily. 07/29/22  Yes Biagio Borg, MD  traMADol (ULTRAM) 50 MG tablet TAKE 1 TABLET BY MOUTH TWICE A DAY AS NEEDED FOR PAIN 12/23/22  Yes Biagio Borg, MD  glipiZIDE (GLUCOTROL XL) 2.5 MG 24 hr tablet Take 1 tablet by mouth daily with breakfast.    [provider]  nitroGLYCERIN (NITROSTAT) 0.4 MG SL tablet PLACE 1 TABLET UNDER THE TONGUE EVERY 5 MINUTES  AS NEEDED FOR CHEST PAIN 06/18/22   Sherren Mocha, MD    Current Outpatient Medications  Medication Sig Dispense Refill   aspirin EC 81 MG tablet TAKE 1 TABLET BY MOUTH EVERY DAY 90 tablet 3   escitalopram (LEXAPRO) 10 MG tablet TAKE 1 TABLET BY MOUTH EVERY DAY 90 tablet 3   gabapentin (NEURONTIN) 100 MG capsule Take 1 capsule (100 mg total) by mouth 3 (three) times daily. 90 capsule 5   metFORMIN (GLUCOPHAGE-XR) 500 MG 24 hr tablet TAKE 2 TABLETS BY MOUTH EVERY DAY WITH BREAKFAST 180 tablet 3   rosuvastatin (CRESTOR) 5 MG tablet Take 1 tablet (5 mg total) by mouth daily. 90 tablet 1   Semaglutide (RYBELSUS) 3 MG TABS Take 3 mg by mouth daily. 90 tablet 3   traMADol  (ULTRAM) 50 MG tablet TAKE 1 TABLET BY MOUTH TWICE A DAY AS NEEDED FOR PAIN 60 tablet 2   glipiZIDE (GLUCOTROL XL) 2.5 MG 24 hr tablet Take 1 tablet by mouth daily with breakfast.     nitroGLYCERIN (NITROSTAT) 0.4 MG SL tablet PLACE 1 TABLET UNDER THE TONGUE EVERY 5 MINUTES AS NEEDED FOR CHEST PAIN 25 tablet 10   Current Facility-Administered Medications  Medication Dose Route Frequency Provider Last Rate Last Admin   0.9 %  sodium chloride infusion  500 mL Intravenous Once Lucio Litsey V, DO        Allergies as of 12/24/2022 - Review Complete 12/24/2022  Allergen Reaction Noted   Amoxicillin  09/05/2007    Family History  Problem Relation Age of Onset   Heart disease Mother    Esophageal cancer Mother    Diabetes Father    Hip fracture Father    Coronary artery disease Father    Heart disease Father    Prostate cancer Brother    Heart disease Maternal Aunt    Breast cancer Maternal Aunt    Cancer Maternal Aunt        breast   Colon cancer Maternal Aunt    Heart disease Maternal Uncle    Liver cancer Maternal Uncle    Heart disease Maternal Grandmother    Heart disease Maternal Grandfather    Rectal cancer Neg Hx    Stomach cancer Neg Hx    Colon polyps Neg Hx     Social History   Socioeconomic History   Marital status: Married    Spouse name: Not on file   Number of children: 2   Years of education: Not on file   Highest education level: Not on file  Occupational History    Comment: full time  Tobacco Use   Smoking status: Former    Packs/day: 0.50    Years: 15.00    Total pack years: 7.50    Types: Cigarettes    Quit date: 10/20/1982    Years since quitting: 40.2   Smokeless tobacco: Never  Vaping Use   Vaping Use: Never used  Substance and Sexual Activity   Alcohol use: Never   Drug use: Never   Sexual activity: Yes  Other Topics Concern   Not on file  Social History Narrative   DOT - Psychologist, clinical. Resides in [place] with wife. Has one  son and one daughter.   Social Determinants of Health   Financial Resource Strain: Not on file  Food Insecurity: Not on file  Transportation Needs: Not on file  Physical Activity: Not on file  Stress: Not on file  Social Connections: Not on file  Intimate  Partner Violence: Not At Risk (10/26/2018)   Humiliation, Afraid, Rape, and Kick questionnaire    Fear of Current or Ex-Partner: No    Emotionally Abused: No    Physically Abused: No    Sexually Abused: No    Physical Exam: Vital signs in last 24 hours: '@BP'$  103/63   Pulse 96   Temp 97.8 F (36.6 C) (Temporal)   Ht '5\' 4"'$  (1.626 m)   Wt 168 lb (76.2 kg)   SpO2 98%   BMI 28.84 kg/m  GEN: NAD EYE: Sclerae anicteric ENT: MMM CV: Non-tachycardic Pulm: CTA b/l GI: Soft, NT/ND NEURO:  Alert & Oriented x 3   Gerrit Heck, DO [facility]   12/24/2022 10:48 AM

## 2022-12-27 ENCOUNTER — Telehealth: Payer: Self-pay

## 2022-12-27 NOTE — Telephone Encounter (Signed)
Follow up call to pt, lm for pt to call if having any difficulty with normal activities or eating and drinking.  Also to call if any other questions or concerns.  

## 2022-12-27 NOTE — Telephone Encounter (Signed)
Opened in error

## 2023-01-05 ENCOUNTER — Encounter: Payer: Self-pay | Admitting: Gastroenterology

## 2023-01-26 ENCOUNTER — Ambulatory Visit: Payer: BC Managed Care – PPO | Admitting: Internal Medicine

## 2023-01-26 VITALS — BP 116/66 | HR 71 | Temp 98.2°F | Ht 64.0 in | Wt 162.4 lb

## 2023-01-26 DIAGNOSIS — E785 Hyperlipidemia, unspecified: Secondary | ICD-10-CM | POA: Diagnosis not present

## 2023-01-26 DIAGNOSIS — Z23 Encounter for immunization: Secondary | ICD-10-CM | POA: Diagnosis not present

## 2023-01-26 DIAGNOSIS — E1165 Type 2 diabetes mellitus with hyperglycemia: Secondary | ICD-10-CM | POA: Diagnosis not present

## 2023-01-26 DIAGNOSIS — E559 Vitamin D deficiency, unspecified: Secondary | ICD-10-CM

## 2023-01-26 DIAGNOSIS — E1142 Type 2 diabetes mellitus with diabetic polyneuropathy: Secondary | ICD-10-CM | POA: Diagnosis not present

## 2023-01-26 LAB — BASIC METABOLIC PANEL
BUN: 15 mg/dL (ref 6–23)
CO2: 31 mEq/L (ref 19–32)
Calcium: 10 mg/dL (ref 8.4–10.5)
Chloride: 102 mEq/L (ref 96–112)
Creatinine, Ser: 1.15 mg/dL (ref 0.40–1.50)
GFR: 68.51 mL/min (ref >60.00)
Glucose, Bld: 109 mg/dL — ABNORMAL HIGH (ref 70–99)
Potassium: 4.8 mEq/L (ref 3.5–5.1)
Sodium: 140 mEq/L (ref 135–145)

## 2023-01-26 LAB — HEPATIC FUNCTION PANEL
ALT: 26 U/L (ref 0–53)
AST: 22 U/L (ref 0–37)
Albumin: 4.8 g/dL (ref 3.5–5.2)
Alkaline Phosphatase: 96 U/L (ref 39–117)
Bilirubin, Direct: 0.2 mg/dL (ref 0.0–0.3)
Total Bilirubin: 0.7 mg/dL (ref 0.2–1.2)
Total Protein: 7.2 g/dL (ref 6.0–8.3)

## 2023-01-26 LAB — HEMOGLOBIN A1C: Hgb A1c MFr Bld: 7.1 % — ABNORMAL HIGH (ref 4.6–6.5)

## 2023-01-26 LAB — LIPID PANEL
Cholesterol: 126 mg/dL (ref 0–200)
HDL: 61.9 mg/dL (ref >39.00)
LDL Cholesterol: 49 mg/dL (ref 0–99)
NonHDL: 63.68
Total CHOL/HDL Ratio: 2
Triglycerides: 75 mg/dL (ref 0.0–149.0)
VLDL: 15 mg/dL (ref 0.0–40.0)

## 2023-01-26 LAB — VITAMIN D 25 HYDROXY (VIT D DEFICIENCY, FRACTURES): VITD: 33.96 ng/mL (ref 30.00–100.00)

## 2023-01-26 MED ORDER — GABAPENTIN 100 MG PO CAPS: 100.0000 mg | ORAL_CAPSULE | Freq: Three times a day (TID) | ORAL | 5 refills | Status: DC

## 2023-01-26 NOTE — Progress Notes (Signed)
Patient ID: [name] Gross, male   DOB: 12-26-1960, 62 y.o.   MRN: [id number]        Chief Complaint: follow up HLD and hyperglycemia., neuropathy, low vit d       HPI:  [name] Gross is a 62 y.o. male here overall doing ok, Pt denies chest pain, increased sob or doe, wheezing, orthopnea, PND, increased LE swelling, palpitations, dizziness or syncope.   Pt denies polydipsia, polyuria, or new focal neuro s/s.    Pt denies fever, wt loss, night sweats, loss of appetite, or other constitutional symptoms  Wt down 179 - 162 in 6 mo with rybeslsu   CBGs 125- 135.  Due for shingles shot #2.  Not taking Vit d. Ran out of gabapentin with worsening leg neuritic pain, needs restart.    Wt Readings from Last 3 Encounters:  01/26/23 162 lb 6 oz (73.7 kg)  12/24/22 168 lb (76.2 kg)  11/09/22 168 lb (76.2 kg)   BP Readings from Last 3 Encounters:  01/26/23 116/66  12/24/22 107/70  07/29/22 120/68         Past Medical History:  Diagnosis Date   Allergic rhinitis    Allergy    Anxiety    Asthma    mild intermittent   CAD (coronary artery disease)    s/p INF STEMI 12/04/2010: treated with BMS to RCA c/b acute stent thrombosis at completion of  primary PCI ...above tx with second BMS with adjunctive thrombectomy residual CAD at cath 12/04/2010: pRCA 40-50%; LAD and CFX ok; EF 45% Echo 12/06/2010: Ef 55-65%; ?HK inferior wall; mild MR   Diabetes mellitus without complication (HCC)    Erectile dysfunction    GERD (gastroesophageal reflux disease)    Glucose intolerance (impaired glucose tolerance)    Myocardial infarction ([place]) 2011   2 within an hour   Obesity    OSA (obstructive sleep apnea)    mild, no CPAP per pt preference   Osteoarthritis    Prostate cancer [facility])    Sleep apnea    Past Surgical History:  Procedure Laterality Date   COLONOSCOPY  2013   CORONARY STENT PLACEMENT  2011   Stenting of the right coronary artery......... Successful percutaneous coronary intervention with  adjunctive  thrombectomy for treatment of acute stent thrombosis and   reocclusion of the right coronary artery after treatment earlier today  of an acute myocardial infarction   ELBOW SURGERY     KNEE SURGERY     NASAL SINUS SURGERY     SPERMATOCELECTOMY     WRIST SURGERY      reports that he quit smoking about 40 years ago. His smoking use included cigarettes. He has a 7.50 pack-year smoking history. He has never used smokeless tobacco. He reports that he does not drink alcohol and does not use drugs. family history includes Breast cancer in his maternal aunt; Cancer in his maternal aunt; Colon cancer in his maternal aunt; Coronary artery disease in his father; Diabetes in his father; Esophageal cancer in his mother; Heart disease in his father, maternal aunt, maternal grandfather, maternal grandmother, maternal uncle, and mother; Hip fracture in his father; Liver cancer in his maternal uncle; Prostate cancer in his brother. Allergies  Allergen Reactions   Amoxicillin     Per the pt "it was years ago and does not remember the reaction"   Current Outpatient Medications on File Prior to Visit  Medication Sig Dispense Refill   aspirin EC 81 MG tablet TAKE  1 TABLET BY MOUTH EVERY DAY 90 tablet 3   escitalopram (LEXAPRO) 10 MG tablet TAKE 1 TABLET BY MOUTH EVERY DAY 90 tablet 3   metFORMIN (GLUCOPHAGE-XR) 500 MG 24 hr tablet TAKE 2 TABLETS BY MOUTH EVERY DAY WITH BREAKFAST 180 tablet 3   nitroGLYCERIN (NITROSTAT) 0.4 MG SL tablet PLACE 1 TABLET UNDER THE TONGUE EVERY 5 MINUTES AS NEEDED FOR CHEST PAIN 25 tablet 10   rosuvastatin (CRESTOR) 5 MG tablet Take 1 tablet (5 mg total) by mouth daily. 90 tablet 1   Semaglutide (RYBELSUS) 3 MG TABS Take 3 mg by mouth daily. 90 tablet 3   traMADol (ULTRAM) 50 MG tablet TAKE 1 TABLET BY MOUTH TWICE A DAY AS NEEDED FOR PAIN 60 tablet 2   No current facility-administered medications on file prior to visit.        ROS:  All others reviewed and negative.  Objective         PE:  BP 116/66   Pulse 71   Temp 98.2 F (36.8 C) (Temporal)   Ht 5' 4"$  (1.626 m)   Wt 162 lb 6 oz (73.7 kg)   SpO2 95%   BMI 27.87 kg/m                 Constitutional: Pt appears in NAD               HENT: Head: NCAT.                Right Ear: External ear normal.                 Left Ear: External ear normal.                Eyes: . Pupils are equal, round, and reactive to light. Conjunctivae and EOM are normal               Nose: without d/c or deformity               Neck: Neck supple. Gross normal ROM               Cardiovascular: Normal rate and regular rhythm.                 Pulmonary/Chest: Effort normal and breath sounds without rales or wheezing.                Abd:  Soft, NT, ND, + BS, no organomegaly               Neurological: Pt is alert. At baseline orientation, motor grossly intact               Skin: Skin is warm. No rashes, no other new lesions, LE edema - none               Psychiatric: Pt behavior is normal without agitation   Micro: none  Cardiac tracings I have personally interpreted today:  none  Pertinent Radiological findings (summarize): none   Lab Results  Component Value Date   WBC 6.9 07/29/2022   HGB 15.2 07/29/2022   HCT 44.8 07/29/2022   PLT 280.0 07/29/2022   GLUCOSE 109 (H) 01/26/2023   CHOL 126 01/26/2023   TRIG 75.0 01/26/2023   HDL 61.90 01/26/2023   LDLDIRECT 67.0 01/28/2022   LDLCALC 49 01/26/2023   ALT 26 01/26/2023   AST 22 01/26/2023   NA 140 01/26/2023   K 4.8 01/26/2023   CL 102 01/26/2023  CREATININE 1.15 01/26/2023   BUN 15 01/26/2023   CO2 31 01/26/2023   TSH 1.95 07/29/2022   PSA 0.06 (L) 07/29/2022   INR 1.00 12/22/2010   HGBA1C 7.1 (H) 01/26/2023   MICROALBUR <0.7 07/29/2022   Assessment/Plan:  [name] Gross is a 62 y.o. White or Caucasian [1] male with  has a past medical history of Allergic rhinitis, Allergy, Anxiety, Asthma, CAD (coronary artery disease), Diabetes mellitus without complication  ([place]), Erectile dysfunction, GERD (gastroesophageal reflux disease), Glucose intolerance (impaired glucose tolerance), Myocardial infarction ([place]) (2011), Obesity, OSA (obstructive sleep apnea), Osteoarthritis, Prostate cancer ([place]), and Sleep apnea.  Vitamin D deficiency Last vitamin D Lab Results  Component Value Date   VD25OH 33.96 01/26/2023   Low, to start oral replacement   Dyslipidemia Lab Results  Component Value Date   LDLCALC 49 01/26/2023   Stable, pt to continue current statin crestor 5 mg qd   Diabetic peripheral neuropathy (HCC) Persistent, mild worsening, for gabapentin restart  Diabetes mellitus ([place]) Lab Results  Component Value Date   HGBA1C 7.1 (H) 01/26/2023   Uncontrolled, goal A1c < 7, pt to continue current medical treatment metfomrin ER 500 mg - 2 qd, rybelsus 3 mg qd and better DM diet, declines other change  Followup: Return in about 6 months (around 07/27/2023).  Cathlean Cower, MD 01/29/2023 6:39 PM [facility]

## 2023-01-26 NOTE — Patient Instructions (Signed)
You had the Shingles shot #2 today (the last one)  Please continue all other medications as before, and refills have been done if requested.  Please have the pharmacy call with any other refills you may need.  Please continue your efforts at being more active, low cholesterol diet, and weight control.  You are otherwise up to date with prevention measures today.  Please keep your appointments with your specialists as you may have planned  Please go to the LAB at the blood drawing area for the tests to be done  You will be contacted by phone if any changes need to be made immediately.  Otherwise, you will receive a letter about your results with an explanation, but please check with MyChart first.  Please remember to sign up for MyChart if you have not done so, as this will be important to you in the future with finding out test results, communicating by private email, and scheduling acute appointments online when needed.  Please make an Appointment to return in 6 months, or sooner if needed

## 2023-01-29 ENCOUNTER — Encounter: Payer: Self-pay | Admitting: Internal Medicine

## 2023-01-29 NOTE — Assessment & Plan Note (Signed)
Last vitamin D Lab Results  Component Value Date   VD25OH 33.96 01/26/2023   Low, to start oral replacement

## 2023-01-29 NOTE — Assessment & Plan Note (Signed)
Persistent, mild worsening, for gabapentin restart

## 2023-01-29 NOTE — Assessment & Plan Note (Signed)
Lab Results  Component Value Date   LDLCALC 49 01/26/2023   Stable, pt to continue current statin crestor 5 mg qd

## 2023-01-29 NOTE — Assessment & Plan Note (Signed)
Lab Results  Component Value Date   HGBA1C 7.1 (H) 01/26/2023   Uncontrolled, goal A1c < 7, pt to continue current medical treatment metfomrin ER 500 mg - 2 qd, rybelsus 3 mg qd and better DM diet, declines other change

## 2023-02-14 ENCOUNTER — Other Ambulatory Visit: Payer: Self-pay | Admitting: Gastroenterology

## 2023-02-14 DIAGNOSIS — Z1211 Encounter for screening for malignant neoplasm of colon: Secondary | ICD-10-CM

## 2023-02-15 ENCOUNTER — Encounter: Payer: Self-pay | Admitting: Internal Medicine

## 2023-02-16 MED ORDER — METFORMIN HCL ER 500 MG PO TB24: ORAL_TABLET | ORAL | 3 refills | Status: DC

## 2023-02-16 MED ORDER — RYBELSUS 7 MG PO TABS: 7.0000 mg | ORAL_TABLET | Freq: Every day | ORAL | 3 refills | Status: DC

## 2023-02-26 LAB — HM DIABETES EYE EXAM

## 2023-03-22 ENCOUNTER — Ambulatory Visit: Payer: BC Managed Care – PPO | Attending: Cardiovascular Disease | Admitting: Cardiovascular Disease

## 2023-03-22 ENCOUNTER — Encounter: Payer: Self-pay | Admitting: Cardiovascular Disease

## 2023-03-22 VITALS — BP 124/90 | HR 73 | Ht 64.0 in | Wt 163.4 lb

## 2023-03-22 DIAGNOSIS — E782 Mixed hyperlipidemia: Secondary | ICD-10-CM | POA: Diagnosis not present

## 2023-03-22 DIAGNOSIS — I10 Essential (primary) hypertension: Secondary | ICD-10-CM | POA: Diagnosis not present

## 2023-03-22 DIAGNOSIS — I2510 Atherosclerotic heart disease of native coronary artery without angina pectoris: Secondary | ICD-10-CM

## 2023-03-22 NOTE — Patient Instructions (Signed)
Medication Instructions:  Your physician recommends that you continue on your current medications as directed. Please refer to the Current Medication list given to you today.  *If you need a refill on your cardiac medications before your next appointment, please call your pharmacy*   Lab Work: NONE If you have labs (blood work) drawn today and your tests are completely normal, you will receive your results only by: MyChart Message (if you have MyChart) OR A paper copy in the mail If you have any lab test that is abnormal or we need to change your treatment, we will call you to review the results.   Testing/Procedures: NONE   Follow-Up: At [facility], you and your health needs are our priority.  As part of our continuing mission to provide you with exceptional heart care, we have created designated Provider Care Teams.  These Care Teams include your primary Cardiologist (physician) and Advanced Practice Providers (APPs -  Physician Assistants and Nurse Practitioners) who all work together to provide you with the care you need, when you need it.  We recommend signing up for the patient portal called "MyChart".  Sign up information is provided on this After Visit Summary.  MyChart is used to connect with patients for Virtual Visits (Telemedicine).  Patients are able to view lab/test results, encounter notes, upcoming appointments, etc.  Non-urgent messages can be sent to your provider as well.   To learn more about what you can do with MyChart, go to [email or web address].    Your next appointment:   1 year(s)  Provider:   Michael Cooper, MD      

## 2023-03-22 NOTE — Progress Notes (Signed)
Cardiology Office Note:    Date:  03/22/2023   ID:  [name] Gross, DOB 07-23-1961, MRN [id number]  PCP:  Biagio Borg, MD   [facility] Prov[facility] Cardiologist:  Sherren Mocha, MD     Referring MD: Biagio Borg, MD   Chief Complaint  Patient presents with   Coronary Artery Disease    History of Present Illness:    [name] Gross is a 62 y.o. male with a hx of coronary artery disease, prostate cancer, hypertension, and mixed hyperlipidemia.  The patient initially presented in 2011 with an acute inferior wall MI treated with bare-metal stenting of the right coronary artery.  His course was complicated by acute stent thrombosis with need for placement of a second stent.  He has done well in the interim with no interval ischemic events and preserved LVEF of 55 to 65%.  The patient underwent total prostatectomy in 2008 and required radiation therapy for increasing PSA in 2019.   The patient is here alone today.  He continues to enjoy retirement.  He is still detailing cars, putting ceramic coatings on cars, and playing golf at least a few days per week.  He has no exertional symptoms.  He feels well.  He specifically denies chest pain, chest pressure, shortness of breath, leg swelling, or heart palpitations.  He adjusted his diabetes medications this past year and started taking semaglutide which has been associated with 20 pound weight loss.  He is feeling much better and has improved energy.  His hemoglobin A1c decreased from 7.7 down to 7.1.  He is followed at least every 6 months by Dr. Jenny Reichmann.  His recent labs are reviewed with results discussed below.  Past Medical History:  Diagnosis Date   Allergic rhinitis    Allergy    Anxiety    Asthma    mild intermittent   CAD (coronary artery disease)    s/p INF STEMI 12/04/2010: treated with BMS to RCA c/b acute stent thrombosis at completion of  primary PCI ...above tx with second BMS with adjunctive thrombectomy residual CAD at cath  12/04/2010: pRCA 40-50%; LAD and CFX ok; EF 45% Echo 12/06/2010: Ef 55-65%; ?HK inferior wall; mild MR   Diabetes mellitus without complication    Erectile dysfunction    GERD (gastroesophageal reflux disease)    Glucose intolerance (impaired glucose tolerance)    Myocardial infarction 2011   2 within an hour   Obesity    OSA (obstructive sleep apnea)    mild, no CPAP per pt preference   Osteoarthritis    Prostate cancer    Sleep apnea     Past Surgical History:  Procedure Laterality Date   COLONOSCOPY  2013   CORONARY STENT PLACEMENT  2011   Stenting of the right coronary artery......... Successful percutaneous coronary intervention with  adjunctive thrombectomy for treatment of acute stent thrombosis and   reocclusion of the right coronary artery after treatment earlier today  of an acute myocardial infarction   ELBOW SURGERY     KNEE SURGERY     NASAL SINUS SURGERY     SPERMATOCELECTOMY     WRIST SURGERY      Current Medications: Current Meds  Medication Sig   aspirin EC 81 MG tablet TAKE 1 TABLET BY MOUTH EVERY DAY   clobetasol cream (TEMOVATE) 0.05 % Apply topically as needed.   escitalopram (LEXAPRO) 10 MG tablet TAKE 1 TABLET BY MOUTH EVERY DAY   gabapentin (NEURONTIN) 100 MG capsule  Take 1 capsule (100 mg total) by mouth 3 (three) times daily.   metFORMIN (GLUCOPHAGE-XR) 500 MG 24 hr tablet TAKE 1 TABLETS BY MOUTH EVERY DAY WITH BREAKFAST   nitroGLYCERIN (NITROSTAT) 0.4 MG SL tablet PLACE 1 TABLET UNDER THE TONGUE EVERY 5 MINUTES AS NEEDED FOR CHEST PAIN   rosuvastatin (CRESTOR) 5 MG tablet Take 1 tablet (5 mg total) by mouth daily.   Semaglutide (RYBELSUS) 7 MG TABS Take 1 tablet (7 mg total) by mouth daily.   traMADol (ULTRAM) 50 MG tablet TAKE 1 TABLET BY MOUTH TWICE A DAY AS NEEDED FOR PAIN     Allergies:   Amoxicillin   Social History   Socioeconomic History   Marital status: Married    Spouse name: Not on file   Number of children: 2   Years of  education: Not on file   Highest education level: Not on file  Occupational History    Comment: full time  Tobacco Use   Smoking status: Former    Packs/day: 0.50    Years: 15.00    Additional pack years: 0.00    Total pack years: 7.50    Types: Cigarettes    Quit date: 10/20/1982    Years since quitting: 40.4   Smokeless tobacco: Never  Vaping Use   Vaping Use: Never used  Substance and Sexual Activity   Alcohol use: Never   Drug use: Never   Sexual activity: Yes  Other Topics Concern   Not on file  Social History Narrative   DOT - Psychologist, clinical. Resides in [place] with wife. Has one son and one daughter.   Social Determinants of Health   Financial Resource Strain: Not on file  Food Insecurity: Not on file  Transportation Needs: Not on file  Physical Activity: Not on file  Stress: Not on file  Social Connections: Not on file     Family History: The patient's family history includes Breast cancer in his maternal aunt; Cancer in his maternal aunt; Colon cancer in his maternal aunt; Coronary artery disease in his father; Diabetes in his father; Esophageal cancer in his mother; Heart disease in his father, maternal aunt, maternal grandfather, maternal grandmother, maternal uncle, and mother; Hip fracture in his father; Liver cancer in his maternal uncle; Prostate cancer in his brother. There is no history of Rectal cancer, Stomach cancer, or Colon polyps.  ROS:   Please see the history of present illness.    All other systems reviewed and are negative.  EKGs/Labs/Other Studies Reviewed:    The following studies were reviewed today: Cardiac Studies & Procedures     STRESS TESTS  EXERCISE TOLERANCE TEST (ETT) 06/03/2015  Narrative  There was no ST segment deviation noted during stress.  Blood pressure demonstrated a hypertensive response to exercise.               EKG:  EKG is ordered today.  The ekg ordered today demonstrates NSR 73, age-indeterminate  inferior infarct, no change from prior tracings.   Recent Labs: 07/29/2022: Hemoglobin 15.2; Platelets 280.0; TSH 1.95 01/26/2023: ALT 26; BUN 15; Creatinine, Ser 1.15; Potassium 4.8; Sodium 140  Recent Lipid Panel    Component Value Date/Time   CHOL 126 01/26/2023 0845   TRIG 75.0 01/26/2023 0845   TRIG 241 (HH) 10/31/2006 0917   HDL 61.90 01/26/2023 0845   CHOLHDL 2 01/26/2023 0845   VLDL 15.0 01/26/2023 0845   LDLCALC 49 01/26/2023 0845   LDLCALC 65 07/22/2020 1112   LDLDIRECT  67.0 01/28/2022 1108     Risk Assessment/Calculations:            Physical Exam:    VS:  BP (!) 124/90   Pulse 73   Ht 5\' 4"  (1.626 m)   Wt 163 lb 6.4 oz (74.1 kg)   SpO2 97%   BMI 28.05 kg/m     Wt Readings from Last 3 Encounters:  03/22/23 163 lb 6.4 oz (74.1 kg)  01/26/23 162 lb 6 oz (73.7 kg)  12/24/22 168 lb (76.2 kg)     GEN:  Well nourished, well developed in no acute distress HEENT: Normal NECK: No JVD; No carotid bruits LYMPHATICS: No lymphadenopathy CARDIAC: RRR, no murmurs, rubs, gallops RESPIRATORY:  Clear to auscultation without rales, wheezing or rhonchi  ABDOMEN: Soft, non-tender, non-distended MUSCULOSKELETAL:  No edema; No deformity  SKIN: Warm and dry NEUROLOGIC:  Alert and oriented x 3 PSYCHIATRIC:  Normal affect   ASSESSMENT:    1. Coronary artery disease involving native coronary artery of native heart without angina pectoris   2. Essential hypertension   3. Mixed hyperlipidemia    PLAN:    In order of problems listed above:  Remained stable without angina.  No change in his EKG.  Continues on aspirin for antiplatelet therapy, statin drug. Blood pressure has been controlled over time.  With his weight loss, not currently requiring any medication. Treated with low-dose rosuvastatin.  Cholesterol is 126, HDL 62, LDL 49.  Continue current medical program.  I will see him back in 1 year unless problems arise in the interim.      Medication Adjustments/Labs  and Tests Ordered: Current medicines are reviewed at length with the patient today.  Concerns regarding medicines are outlined above.  Orders Placed This Encounter  Procedures   EKG 12-Lead   No orders of the defined types were placed in this encounter.   Patient Instructions  Medication Instructions:  Your physician recommends that you continue on your current medications as directed. Please refer to the Current Medication list given to you today.  *If you need a refill on your cardiac medications before your next appointment, please call your pharmacy*   Lab Work: NONE If you have labs (blood work) drawn today and your tests are completely normal, you will receive your results only by: [place] (if you have MyChart) OR A paper copy in the mail If you have any lab test that is abnormal or we need to change your treatment, we will call you to review the results.   Testing/Procedures: NONE   Follow-Up: At [facility], you and your health needs are our priority.  As part of our continuing mission to provide you with exceptional heart care, we have created designated Provider Care Teams.  These Care Teams include your primary Cardiologist (physician) and Advanced Practice Providers (APPs -  Physician Assistants and Nurse Practitioners) who all work together to provide you with the care you need, when you need it.  We recommend signing up for the patient portal called "MyChart".  Sign up information is provided on this After Visit Summary.  MyChart is used to connect with patients for Virtual Visits (Telemedicine).  Patients are able to view lab/test results, encounter notes, upcoming appointments, etc.  Non-urgent messages can be sent to your provider as well.   To learn more about what you can do with MyChart, go to [email or web address].    Your next appointment:   1 year(s)  Provider:   Legrand Como  Burt Knack, MD        Signed, Sherren Mocha, MD  03/22/2023 5:39  PM    [place]

## 2023-03-25 ENCOUNTER — Other Ambulatory Visit: Payer: Self-pay | Admitting: Cardiovascular Disease

## 2023-06-03 ENCOUNTER — Other Ambulatory Visit: Payer: Self-pay | Admitting: Cardiovascular Disease

## 2023-06-15 ENCOUNTER — Other Ambulatory Visit: Payer: Self-pay | Admitting: Internal Medicine

## 2023-08-21 ENCOUNTER — Other Ambulatory Visit: Payer: Self-pay | Admitting: Internal Medicine

## 2023-08-23 ENCOUNTER — Other Ambulatory Visit: Payer: Self-pay

## 2023-09-19 ENCOUNTER — Other Ambulatory Visit: Payer: Self-pay

## 2023-09-19 ENCOUNTER — Other Ambulatory Visit: Payer: Self-pay | Admitting: Internal Medicine

## 2023-10-26 ENCOUNTER — Ambulatory Visit: Payer: BC Managed Care – PPO | Admitting: Internal Medicine

## 2023-10-26 VITALS — BP 122/78 | HR 72 | Temp 97.7°F | Ht 64.0 in | Wt 164.0 lb

## 2023-10-26 DIAGNOSIS — Z8546 Personal history of malignant neoplasm of prostate: Secondary | ICD-10-CM

## 2023-10-26 DIAGNOSIS — E559 Vitamin D deficiency, unspecified: Secondary | ICD-10-CM | POA: Diagnosis not present

## 2023-10-26 DIAGNOSIS — Z7984 Long term (current) use of oral hypoglycemic drugs: Secondary | ICD-10-CM

## 2023-10-26 DIAGNOSIS — E1165 Type 2 diabetes mellitus with hyperglycemia: Secondary | ICD-10-CM | POA: Diagnosis not present

## 2023-10-26 DIAGNOSIS — Z0001 Encounter for general adult medical examination with abnormal findings: Secondary | ICD-10-CM | POA: Diagnosis not present

## 2023-10-26 DIAGNOSIS — E785 Hyperlipidemia, unspecified: Secondary | ICD-10-CM

## 2023-10-26 DIAGNOSIS — E538 Deficiency of other specified B group vitamins: Secondary | ICD-10-CM

## 2023-10-26 LAB — CBC WITH DIFFERENTIAL/PLATELET
Basophils Absolute: 0.1 10*3/uL (ref 0.0–0.1)
Basophils Relative: 1.8 % (ref 0.0–3.0)
Eosinophils Absolute: 0.2 10*3/uL (ref 0.0–0.7)
Eosinophils Relative: 3.9 % (ref 0.0–5.0)
HCT: 45.9 % (ref 39.0–52.0)
Hemoglobin: 15.2 g/dL (ref 13.0–17.0)
Lymphocytes Relative: 22.4 % (ref 12.0–46.0)
Lymphs Abs: 1.3 10*3/uL (ref 0.7–4.0)
MCHC: 33.1 g/dL (ref 30.0–36.0)
MCV: 88.4 fL (ref 78.0–100.0)
Monocytes Absolute: 0.5 10*3/uL (ref 0.1–1.0)
Monocytes Relative: 8.2 % (ref 3.0–12.0)
Neutro Abs: 3.7 10*3/uL (ref 1.4–7.7)
Neutrophils Relative %: 63.7 % (ref 43.0–77.0)
Platelets: 316 10*3/uL (ref 150.0–400.0)
RBC: 5.19 Mil/uL (ref 4.22–5.81)
RDW: 13.1 % (ref 11.5–15.5)
WBC: 5.8 10*3/uL (ref 4.0–10.5)

## 2023-10-26 LAB — URINALYSIS, ROUTINE W REFLEX MICROSCOPIC
Bilirubin Urine: NEGATIVE
Hgb urine dipstick: NEGATIVE
Ketones, ur: NEGATIVE
Leukocytes,Ua: NEGATIVE
Nitrite: NEGATIVE
RBC / HPF: NONE SEEN (ref 0–?)
Specific Gravity, Urine: >=1.03 — AB (ref 1.000–1.030)
Total Protein, Urine: NEGATIVE
Urine Glucose: 250 — AB
Urobilinogen, UA: 0.2 (ref 0.0–1.0)
pH: 5.5 (ref 5.0–8.0)

## 2023-10-26 LAB — LIPID PANEL
Cholesterol: 137 mg/dL (ref 0–200)
HDL: 62.7 mg/dL (ref >39.00)
LDL Cholesterol: 42 mg/dL (ref 0–99)
NonHDL: 74.33
Total CHOL/HDL Ratio: 2
Triglycerides: 163 mg/dL — ABNORMAL HIGH (ref 0.0–149.0)
VLDL: 32.6 mg/dL (ref 0.0–40.0)

## 2023-10-26 LAB — HEPATIC FUNCTION PANEL
ALT: 34 U/L (ref 0–53)
AST: 24 U/L (ref 0–37)
Albumin: 4.5 g/dL (ref 3.5–5.2)
Alkaline Phosphatase: 89 U/L (ref 39–117)
Bilirubin, Direct: 0.1 mg/dL (ref 0.0–0.3)
Total Bilirubin: 0.5 mg/dL (ref 0.2–1.2)
Total Protein: 6.5 g/dL (ref 6.0–8.3)

## 2023-10-26 LAB — BASIC METABOLIC PANEL
BUN: 12 mg/dL (ref 6–23)
CO2: 30 meq/L (ref 19–32)
Calcium: 9.5 mg/dL (ref 8.4–10.5)
Chloride: 103 meq/L (ref 96–112)
Creatinine, Ser: 1.17 mg/dL (ref 0.40–1.50)
GFR: 66.76 mL/min (ref >60.00)
Glucose, Bld: 176 mg/dL — ABNORMAL HIGH (ref 70–99)
Potassium: 4.5 meq/L (ref 3.5–5.1)
Sodium: 139 meq/L (ref 135–145)

## 2023-10-26 LAB — MICROALBUMIN / CREATININE URINE RATIO
Creatinine,U: 155 mg/dL
Microalb Creat Ratio: 0.5 mg/g (ref 0.0–30.0)
Microalb, Ur: 0.8 mg/dL (ref 0.0–1.9)

## 2023-10-26 LAB — HEMOGLOBIN A1C: Hgb A1c MFr Bld: 7.9 % — ABNORMAL HIGH (ref 4.6–6.5)

## 2023-10-26 LAB — VITAMIN B12: Vitamin B-12: 340 pg/mL (ref 211–911)

## 2023-10-26 LAB — VITAMIN D 25 HYDROXY (VIT D DEFICIENCY, FRACTURES): VITD: 25.98 ng/mL — ABNORMAL LOW (ref 30.00–100.00)

## 2023-10-26 LAB — PSA: PSA: 0.17 ng/mL (ref 0.10–4.00)

## 2023-10-26 LAB — TSH: TSH: 1.47 u[IU]/mL (ref 0.35–5.50)

## 2023-10-26 MED ORDER — RYBELSUS 14 MG PO TABS
14.0000 mg | ORAL_TABLET | Freq: Every day | ORAL | 3 refills | Status: DC
Start: 2023-10-26 — End: 2024-04-24

## 2023-10-26 NOTE — Progress Notes (Unsigned)
Patient ID: [name] Gross, male   DOB: 1961/08/14, 62 y.o.   MRN: [id number]         Chief Complaint:: wellness exam and hx of prostate ca, low vit d, dm, hld, allergies       HPI:  [name] Gross is a 62 y.o. male here for wellness exam; o/w up to date                Also Pt denies chest pain, increased sob or doe, wheezing, orthopnea, PND, increased LE swelling, palpitations, dizziness or syncope.   Pt denies polydipsia, polyuria, or new focal neuro s/s.    Pt denies fever, wt loss, night sweats, loss of appetite, or other constitutional symptoms  Denies urinary symptoms such as dysuria, frequency, urgency, flank pain, hematuria or n/v, fever, chills.  Not losing wt so far with rybelsus 7 mg.   Wt Readings from Last 3 Encounters:  10/26/23 164 lb (74.4 kg)  03/22/23 163 lb 6.4 oz (74.1 kg)  01/26/23 162 lb 6 oz (73.7 kg)   BP Readings from Last 3 Encounters:  10/26/23 122/78  03/22/23 (!) 124/90  01/26/23 116/66   Immunization History  Administered Date(s) Administered   Influenza Inj Mdck Quad Pf 10/14/2022   Influenza Split 11/18/2011   Influenza Whole 10/15/2008, 10/17/2009, 09/17/2010   Influenza, Seasonal, Injecte, Preservative Fre 11/20/2012   Influenza,inj,Quad PF,6+ Mos 10/11/2014, 10/07/2015, 10/14/2016, 10/17/2017, 10/12/2019, 10/13/2021   Influenza,inj,quad, With Preservative 11/19/2019   Influenza-Unspecified 09/27/2018, 11/19/2020, 08/03/2023   PFIZER(Purple Top)SARS-COV-2 Vaccination 03/03/2020, 03/31/2020, 10/22/2020   Pneumococcal Conjugate-13 04/26/2018   Pneumococcal Polysaccharide-23 07/22/2020   Td 12/21/1995   Td (Adult), 2 Lf Tetanus Toxid, Preservative Free 12/21/1995   Tdap 04/08/2017   Zoster Recombinant(Shingrix) 07/29/2022, 01/26/2023   There are no preventive care reminders to display for this patient.     Past Medical History:  Diagnosis Date   Allergic rhinitis    Allergy    Anxiety    Asthma    mild intermittent   CAD (coronary artery  disease)    s/p INF STEMI 12/04/2010: treated with BMS to RCA c/b acute stent thrombosis at completion of  primary PCI ...above tx with second BMS with adjunctive thrombectomy residual CAD at cath 12/04/2010: pRCA 40-50%; LAD and CFX ok; EF 45% Echo 12/06/2010: Ef 55-65%; ?HK inferior wall; mild MR   Diabetes mellitus without complication (HCC)    Erectile dysfunction    GERD (gastroesophageal reflux disease)    Glucose intolerance (impaired glucose tolerance)    Myocardial infarction (HCC) 2011   2 within an hour   Obesity    OSA (obstructive sleep apnea)    mild, no CPAP per pt preference   Osteoarthritis    Prostate cancer [facility])    Sleep apnea    Past Surgical History:  Procedure Laterality Date   COLONOSCOPY  2013   CORONARY STENT PLACEMENT  2011   Stenting of the right coronary artery......... Successful percutaneous coronary intervention with  adjunctive thrombectomy for treatment of acute stent thrombosis and   reocclusion of the right coronary artery after treatment earlier today  of an acute myocardial infarction   ELBOW SURGERY     KNEE SURGERY     NASAL SINUS SURGERY     SPERMATOCELECTOMY     WRIST SURGERY      reports that he quit smoking about 41 years ago. His smoking use included cigarettes. He started smoking about 56 years ago. He has a 7.5 pack-year smoking  history. He has never used smokeless tobacco. He reports that he does not drink alcohol and does not use drugs. family history includes Breast cancer in his maternal aunt; Cancer in his maternal aunt; Colon cancer in his maternal aunt; Coronary artery disease in his father; Diabetes in his father; Esophageal cancer in his mother; Heart disease in his father, maternal aunt, maternal grandfather, maternal grandmother, maternal uncle, and mother; Hip fracture in his father; Liver cancer in his maternal uncle; Prostate cancer in his brother. Allergies  Allergen Reactions   Amoxicillin     Per the pt "it was years ago  and does not remember the reaction"   Current Outpatient Medications on File Prior to Visit  Medication Sig Dispense Refill   ASPIRIN LOW DOSE 81 MG tablet TAKE 1 TABLET BY MOUTH EVERY DAY 90 tablet 3   clobetasol cream (TEMOVATE) 0.05 % Apply topically as needed.     escitalopram (LEXAPRO) 10 MG tablet TAKE 1 TABLET BY MOUTH EVERY DAY 90 tablet 3   gabapentin (NEURONTIN) 100 MG capsule TAKE 1 CAPSULE (100 MG TOTAL) BY MOUTH THREE TIMES DAILY. 90 capsule 5   metFORMIN (GLUCOPHAGE-XR) 500 MG 24 hr tablet TAKE 1 TABLETS BY MOUTH EVERY DAY WITH BREAKFAST 90 tablet 3   nitroGLYCERIN (NITROSTAT) 0.4 MG SL tablet PLACE 1 TABLET UNDER THE TONGUE EVERY 5 MINUTES AS NEEDED FOR CHEST PAIN 25 tablet 10   rosuvastatin (CRESTOR) 5 MG tablet TAKE 1 TABLET (5 MG TOTAL) BY MOUTH DAILY. 90 tablet 3   traMADol (ULTRAM) 50 MG tablet TAKE 1 TABLET BY MOUTH TWICE A DAY AS NEEDED FOR PAIN 60 tablet 2   No current facility-administered medications on file prior to visit.        ROS:  All others reviewed and negative.  Objective        PE:  BP 122/78 (BP Location: Right Arm, Patient Position: Sitting, Cuff Size: Normal)   Pulse 72   Temp 97.7 F (36.5 C) (Oral)   Ht 5\' 4"  (1.626 m)   Wt 164 lb (74.4 kg)   SpO2 98%   BMI 28.15 kg/m                 Constitutional: Pt appears in NAD               HENT: Head: NCAT.                Right Ear: External ear normal.                 Left Ear: External ear normal.                Eyes: . Pupils are equal, round, and reactive to light. Conjunctivae and EOM are normal               Nose: without d/c or deformity               Neck: Neck supple. Gross normal ROM               Cardiovascular: Normal rate and regular rhythm.                 Pulmonary/Chest: Effort normal and breath sounds without rales or wheezing.                Abd:  Soft, NT, ND, + BS, no organomegaly               Neurological: Pt is  alert. At baseline orientation, motor grossly intact                Skin: Skin is warm. No rashes, no other new lesions, LE edema - none               Psychiatric: Pt behavior is normal without agitation   Micro: none  Cardiac tracings I have personally interpreted today:  none  Pertinent Radiological findings (summarize): none   Lab Results  Component Value Date   WBC 5.8 10/26/2023   HGB 15.2 10/26/2023   HCT 45.9 10/26/2023   PLT 316.0 10/26/2023   GLUCOSE 176 (H) 10/26/2023   CHOL 137 10/26/2023   TRIG 163.0 (H) 10/26/2023   HDL 62.70 10/26/2023   LDLDIRECT 67.0 01/28/2022   LDLCALC 42 10/26/2023   ALT 34 10/26/2023   AST 24 10/26/2023   NA 139 10/26/2023   K 4.5 10/26/2023   CL 103 10/26/2023   CREATININE 1.17 10/26/2023   BUN 12 10/26/2023   CO2 30 10/26/2023   TSH 1.47 10/26/2023   PSA 0.17 10/26/2023   INR 1.00 12/22/2010   HGBA1C 7.9 (H) 10/26/2023   MICROALBUR 0.8 10/26/2023   Assessment/Plan:  [name] Gross is a 62 y.o. White or Caucasian [1] male with  has a past medical history of Allergic rhinitis, Allergy, Anxiety, Asthma, CAD (coronary artery disease), Diabetes mellitus without complication (HCC), Erectile dysfunction, GERD (gastroesophageal reflux disease), Glucose intolerance (impaired glucose tolerance), Myocardial infarction (HCC) (2011), Obesity, OSA (obstructive sleep apnea), Osteoarthritis, Prostate cancer (HCC), and Sleep apnea.  History of malignant neoplasm of prostate Lab Results  Component Value Date   PSA 0.17 10/26/2023   PSA 0.06 (L) 07/29/2022   PSA 0.07 (L) 07/28/2021   Psa trending up, for f/u urology soon as planned  Encounter for well adult exam with abnormal findings Age and sex appropriate education and counseling updated with regular exercise and diet Referrals for preventative services - none needed Immunizations addressed - none needed Smoking counseling  - none needed Evidence for depression or other mood disorder - none significant Most recent labs reviewed. I have personally reviewed  and have noted: 1) the patient's medical and social history 2) The patient's current medications and supplements 3) The patient's height, weight, and BMI have been recorded in the chart   Vitamin D deficiency Last vitamin D Lab Results  Component Value Date   VD25OH 25.98 (L) 10/26/2023   Low, to start oral replacement   Dyslipidemia Lab Results  Component Value Date   LDLCALC 42 10/26/2023   Stable, pt to continue current statin crestor 5 mg qd   Diabetes mellitus (HCC) Lab Results  Component Value Date   HGBA1C 7.9 (H) 10/26/2023   Uncontrolled, ok for increase rybelsus 14 mg every day,, pt to continue current medical treatment metformin ER 500 mg, 1 every day; consider change to ozempic if not improved in 1-2 months  Followup: Return in about 6 months (around 04/24/2024).  Oliver Barre, MD 10/27/2023 8:20 PM [facility]

## 2023-10-26 NOTE — Patient Instructions (Addendum)
Ok to increase the rybelsus to 14 mg per day  We can consider change to ozempic if the rybelsus not working well enough for sugar or wt loss  Please continue all other medications as before, and refills have been done if requested.  Please have the pharmacy call with any other refills you may need.  Please continue your efforts at being more active, low cholesterol diet, and weight control.  You are otherwise up to date with prevention measures today.  Please keep your appointments with your specialists as you may have planned  Please go to the LAB at the blood drawing area for the tests to be done  You will be contacted by phone if any changes need to be made immediately.  Otherwise, you will receive a letter about your results with an explanation, but please check with MyChart first.  Please make an Appointment to return in 6 months, or sooner if needed

## 2023-10-27 ENCOUNTER — Encounter: Payer: Self-pay | Admitting: Internal Medicine

## 2023-10-27 NOTE — Assessment & Plan Note (Signed)

## 2023-10-27 NOTE — Assessment & Plan Note (Signed)
Last vitamin D Lab Results  Component Value Date   VD25OH 25.98 (L) 10/26/2023   Low, to start oral replacement

## 2023-10-27 NOTE — Assessment & Plan Note (Signed)
Lab Results  Component Value Date   LDLCALC 42 10/26/2023   Stable, pt to continue current statin crestor 5 mg qd

## 2023-10-27 NOTE — Assessment & Plan Note (Signed)
Lab Results  Component Value Date   PSA 0.17 10/26/2023   PSA 0.06 (L) 07/29/2022   PSA 0.07 (L) 07/28/2021   Psa trending up, for f/u urology soon as planned

## 2023-10-27 NOTE — Assessment & Plan Note (Signed)
Lab Results  Component Value Date   HGBA1C 7.9 (H) 10/26/2023   Uncontrolled, ok for increase rybelsus 14 mg every day,, pt to continue current medical treatment metformin ER 500 mg, 1 every day; consider change to ozempic if not improved in 1-2 months

## 2023-11-15 ENCOUNTER — Other Ambulatory Visit (HOSPITAL_COMMUNITY): Payer: Self-pay

## 2023-11-15 ENCOUNTER — Telehealth: Payer: Self-pay

## 2023-11-15 NOTE — Telephone Encounter (Signed)
Pharmacy Patient Advocate Encounter   Received notification from CoverMyMeds that prior authorization for Rybelsus 7MG  tablets is required/requested.   Insurance verification completed.   The patient is insured through [facility] .   Per test claim: PA required; PA submitted to above mentioned insurance via CoverMyMeds Key/confirmation #/[facility] Status is pending

## 2023-11-16 ENCOUNTER — Other Ambulatory Visit (HOSPITAL_COMMUNITY): Payer: Self-pay

## 2023-11-16 NOTE — Telephone Encounter (Signed)
Pharmacy Patient Advocate Encounter  Received notification from [employer] [facility] that Prior Authorization for Rybelsus  has been APPROVED from 11.26.24 to 11.26.27. Ran test claim, Copay is $14.99. This test claim was processed through [facility]- copay amounts may vary at other pharmacies due to pharmacy/plan contracts, or as the patient moves through the different stages of their insurance plan.

## 2023-11-29 ENCOUNTER — Other Ambulatory Visit (HOSPITAL_COMMUNITY): Payer: Self-pay

## 2023-11-30 ENCOUNTER — Other Ambulatory Visit: Payer: Self-pay | Admitting: Internal Medicine

## 2024-03-23 ENCOUNTER — Other Ambulatory Visit: Payer: Self-pay

## 2024-03-23 ENCOUNTER — Other Ambulatory Visit: Payer: Self-pay | Admitting: Internal Medicine

## 2024-04-18 ENCOUNTER — Other Ambulatory Visit: Payer: Self-pay

## 2024-04-18 ENCOUNTER — Other Ambulatory Visit: Payer: Self-pay | Admitting: Internal Medicine

## 2024-04-19 ENCOUNTER — Other Ambulatory Visit: Payer: Self-pay | Admitting: Cardiovascular Disease

## 2024-04-19 ENCOUNTER — Other Ambulatory Visit: Payer: Self-pay | Admitting: Internal Medicine

## 2024-04-20 ENCOUNTER — Other Ambulatory Visit: Payer: Self-pay

## 2024-04-20 MED ORDER — TRAMADOL HCL 50 MG PO TABS: 50.0000 mg | ORAL_TABLET | Freq: Two times a day (BID) | ORAL | 1 refills | Status: DC | PRN

## 2024-04-20 NOTE — Addendum Note (Signed)
 Addended by: Roslyn Coombe on: 04/20/2024 09:12 AM   Modules accepted: Orders

## 2024-04-24 ENCOUNTER — Encounter: Payer: Self-pay | Admitting: Internal Medicine

## 2024-04-24 ENCOUNTER — Ambulatory Visit (INDEPENDENT_AMBULATORY_CARE_PROVIDER_SITE_OTHER): Payer: BC Managed Care – PPO | Admitting: Internal Medicine

## 2024-04-24 VITALS — BP 128/76 | HR 67 | Temp 97.6°F | Ht 64.0 in | Wt 156.0 lb

## 2024-04-24 DIAGNOSIS — Z8546 Personal history of malignant neoplasm of prostate: Secondary | ICD-10-CM

## 2024-04-24 DIAGNOSIS — E785 Hyperlipidemia, unspecified: Secondary | ICD-10-CM | POA: Diagnosis not present

## 2024-04-24 DIAGNOSIS — Z0001 Encounter for general adult medical examination with abnormal findings: Secondary | ICD-10-CM | POA: Diagnosis not present

## 2024-04-24 DIAGNOSIS — E1165 Type 2 diabetes mellitus with hyperglycemia: Secondary | ICD-10-CM

## 2024-04-24 DIAGNOSIS — E559 Vitamin D deficiency, unspecified: Secondary | ICD-10-CM

## 2024-04-24 DIAGNOSIS — Z7984 Long term (current) use of oral hypoglycemic drugs: Secondary | ICD-10-CM | POA: Diagnosis not present

## 2024-04-24 LAB — CBC WITH DIFFERENTIAL/PLATELET
Basophils Absolute: 0.1 10*3/uL (ref 0.0–0.1)
Basophils Relative: 1.4 % (ref 0.0–3.0)
Eosinophils Absolute: 0.2 10*3/uL (ref 0.0–0.7)
Eosinophils Relative: 3.6 % (ref 0.0–5.0)
HCT: 45.2 % (ref 39.0–52.0)
Hemoglobin: 15.2 g/dL (ref 13.0–17.0)
Lymphocytes Relative: 20.1 % (ref 12.0–46.0)
Lymphs Abs: 1.2 10*3/uL (ref 0.7–4.0)
MCHC: 33.6 g/dL (ref 30.0–36.0)
MCV: 88.3 fl (ref 78.0–100.0)
Monocytes Absolute: 0.4 10*3/uL (ref 0.1–1.0)
Monocytes Relative: 7.7 % (ref 3.0–12.0)
Neutro Abs: 3.9 10*3/uL (ref 1.4–7.7)
Neutrophils Relative %: 67.2 % (ref 43.0–77.0)
Platelets: 344 10*3/uL (ref 150.0–400.0)
RBC: 5.12 Mil/uL (ref 4.22–5.81)
RDW: 13.4 % (ref 11.5–15.5)
WBC: 5.8 10*3/uL (ref 4.0–10.5)

## 2024-04-24 LAB — URINALYSIS, ROUTINE W REFLEX MICROSCOPIC
Bilirubin Urine: NEGATIVE
Hgb urine dipstick: NEGATIVE
Ketones, ur: NEGATIVE
Leukocytes,Ua: NEGATIVE
Nitrite: NEGATIVE
RBC / HPF: NONE SEEN (ref 0–?)
Specific Gravity, Urine: 1.025 (ref 1.000–1.030)
Total Protein, Urine: NEGATIVE
Urine Glucose: NEGATIVE
Urobilinogen, UA: 1 (ref 0.0–1.0)
pH: 6 (ref 5.0–8.0)

## 2024-04-24 LAB — MICROALBUMIN / CREATININE URINE RATIO
Creatinine,U: 131.3 mg/dL
Microalb Creat Ratio: 6.1 mg/g (ref 0.0–30.0)
Microalb, Ur: 0.8 mg/dL (ref 0.0–1.9)

## 2024-04-24 LAB — BASIC METABOLIC PANEL WITH GFR
BUN: 12 mg/dL (ref 6–23)
CO2: 32 meq/L (ref 19–32)
Calcium: 10.1 mg/dL (ref 8.4–10.5)
Chloride: 100 meq/L (ref 96–112)
Creatinine, Ser: 1.19 mg/dL (ref 0.40–1.50)
GFR: 65.18 mL/min (ref >60.00)
Glucose, Bld: 150 mg/dL — ABNORMAL HIGH (ref 70–99)
Potassium: 4.9 meq/L (ref 3.5–5.1)
Sodium: 139 meq/L (ref 135–145)

## 2024-04-24 LAB — LIPID PANEL
Cholesterol: 130 mg/dL (ref 0–200)
HDL: 60.6 mg/dL (ref >39.00)
LDL Cholesterol: 48 mg/dL (ref 0–99)
NonHDL: 69.44
Total CHOL/HDL Ratio: 2
Triglycerides: 108 mg/dL (ref 0.0–149.0)
VLDL: 21.6 mg/dL (ref 0.0–40.0)

## 2024-04-24 LAB — TSH: TSH: 2.39 u[IU]/mL (ref 0.35–5.50)

## 2024-04-24 LAB — PSA: PSA: 0.18 ng/mL (ref 0.10–4.00)

## 2024-04-24 LAB — HEPATIC FUNCTION PANEL
ALT: 20 U/L (ref 0–53)
AST: 19 U/L (ref 0–37)
Albumin: 4.6 g/dL (ref 3.5–5.2)
Alkaline Phosphatase: 88 U/L (ref 39–117)
Bilirubin, Direct: 0.1 mg/dL (ref 0.0–0.3)
Total Bilirubin: 0.6 mg/dL (ref 0.2–1.2)
Total Protein: 6.5 g/dL (ref 6.0–8.3)

## 2024-04-24 LAB — VITAMIN D 25 HYDROXY (VIT D DEFICIENCY, FRACTURES): VITD: 35.75 ng/mL (ref 30.00–100.00)

## 2024-04-24 LAB — HEMOGLOBIN A1C: Hgb A1c MFr Bld: 6.8 % — ABNORMAL HIGH (ref 4.6–6.5)

## 2024-04-24 MED ORDER — RYBELSUS 14 MG PO TABS: 14.0000 mg | ORAL_TABLET | Freq: Every day | ORAL | 3 refills | Status: AC

## 2024-04-24 NOTE — Patient Instructions (Signed)
 Please continue all other medications as before, and refills have been done if requested - the rybelsus   Please have the pharmacy call with any other refills you may need.  Please continue your efforts at being more active, low cholesterol diet, and weight control.  You are otherwise up to date with prevention measures today.  Please keep your appointments with your specialists as you may have planned  Please go to the LAB at the blood drawing area for the tests to be done  You will be contacted by phone if any changes need to be made immediately.  Otherwise, you will receive a letter about your results with an explanation, but please check with MyChart first.  Please make an Appointment to return in 6 months, or sooner if needed

## 2024-04-24 NOTE — Assessment & Plan Note (Signed)
 Lab Results  Component Value Date   PSA 0.17 10/26/2023   PSA 0.06 (L) 07/29/2022   PSA 0.07 (L) 07/28/2021   Stable, for urology f/u as planned

## 2024-04-24 NOTE — Assessment & Plan Note (Signed)
 Last vitamin D Lab Results  Component Value Date   VD25OH 25.98 (L) 10/26/2023   Low, to start oral replacement

## 2024-04-24 NOTE — Assessment & Plan Note (Signed)
 Lab Results  Component Value Date   LDLCALC 42 10/26/2023   Stable, pt to continue current statin crestor 5 mg qd

## 2024-04-24 NOTE — Progress Notes (Signed)
 The test results show that your current treatment is OK, as the tests are stable.  Please continue the same plan.  There is no other need for change of treatment or further evaluation based on these results, at this time.  thanks

## 2024-04-24 NOTE — Assessment & Plan Note (Signed)
 Age and sex appropriate education and counseling updated with regular exercise and diet Referrals for preventative services - pt has optho appt soon Immunizations addressed - none needed Smoking counseling  - none needed Evidence for depression or other mood disorder - none significant Most recent labs reviewed. I have personally reviewed and have noted: 1) the patient's medical and social history 2) The patient's current medications and supplements 3) The patient's height, weight, and BMI have been recorded in the chart

## 2024-04-24 NOTE — Assessment & Plan Note (Signed)
 Lab Results  Component Value Date   HGBA1C 7.9 (H) 10/26/2023   Uncontrolled, has been tolerating rybelsus  14 mg, metformin  ER 500 mg - 1 every day, , pt to continue current medical treatment and f/u A1c today

## 2024-04-24 NOTE — Progress Notes (Signed)
 Patient ID: [name] Gross, male   DOB: 11/13/1961, 63 y.o.   MRN: [id number]         Chief Complaint:: wellness exam and hx of prostate ca, hld, low vit d, dm,       HPI:  [name] Gross is a 63 y.o. male here for wellness exam; plans to see optho soon; o/w up to date                        Also Pt denies chest pain, increased sob or doe, wheezing, orthopnea, PND, increased LE swelling, palpitations, dizziness or syncope.   Pt denies polydipsia, polyuria, or new focal neuro s/s.   Needs new rybelsus  refill, tolerating well.   Pt denies fever, wt loss, night sweats, loss of appetite, or other constitutional symptoms  Denies urinary symptoms such as dysuria, frequency, urgency, flank pain, hematuria or n/v, fever, chills.   Wt Readings from Last 3 Encounters:  04/24/24 156 lb (70.8 kg)  10/26/23 164 lb (74.4 kg)  03/22/23 163 lb 6.4 oz (74.1 kg)   BP Readings from Last 3 Encounters:  04/24/24 128/76  10/26/23 122/78  03/22/23 (!) 124/90   Immunization History  Administered Date(s) Administered   Influenza Inj Mdck Quad Pf 10/14/2022   Influenza Split 11/18/2011   Influenza Whole 10/15/2008, 10/17/2009, 09/17/2010   Influenza, Seasonal, Injecte, Preservative Fre 11/20/2012   Influenza,inj,Quad PF,6+ Mos 10/11/2014, 10/07/2015, 10/14/2016, 10/17/2017, 10/12/2019, 10/13/2021   Influenza,inj,quad, With Preservative 11/19/2019   Influenza-Unspecified 09/27/2018, 11/19/2020, 08/03/2023   PFIZER(Purple Top)SARS-COV-2 Vaccination 03/03/2020, 03/31/2020, 10/22/2020   Pneumococcal Conjugate-13 04/26/2018   Pneumococcal Polysaccharide-23 07/22/2020   Td 12/21/1995   Td (Adult), 2 Lf Tetanus Toxid, Preservative Free 12/21/1995   Tdap 04/08/2017   Zoster Recombinant(Shingrix ) 07/29/2022, 01/26/2023   Health Maintenance Due  Topic Date Due   OPHTHALMOLOGY EXAM  02/26/2024   HEMOGLOBIN A1C  04/24/2024      Past Medical History:  Diagnosis Date   Allergic rhinitis    Allergy    Anxiety     Asthma    mild intermittent   CAD (coronary artery disease)    s/p INF STEMI 12/04/2010: treated with BMS to RCA c/b acute stent thrombosis at completion of  primary PCI ...above tx with second BMS with adjunctive thrombectomy residual CAD at cath 12/04/2010: pRCA 40-50%; LAD and CFX ok; EF 45% Echo 12/06/2010: Ef 55-65%; ?HK inferior wall; mild MR   Diabetes mellitus without complication (HCC)    Erectile dysfunction    GERD (gastroesophageal reflux disease)    Glucose intolerance (impaired glucose tolerance)    Myocardial infarction (HCC) 2011   2 within an hour   Obesity    OSA (obstructive sleep apnea)    mild, no CPAP per pt preference   Osteoarthritis    Prostate cancer [facility])    Sleep apnea    Past Surgical History:  Procedure Laterality Date   COLONOSCOPY  2013   CORONARY STENT PLACEMENT  2011   Stenting of the right coronary artery......... Successful percutaneous coronary intervention with  adjunctive thrombectomy for treatment of acute stent thrombosis and   reocclusion of the right coronary artery after treatment earlier today  of an acute myocardial infarction   ELBOW SURGERY     KNEE SURGERY     NASAL SINUS SURGERY     SPERMATOCELECTOMY     WRIST SURGERY      reports that he quit smoking about 41 years ago. His smoking  use included cigarettes. He started smoking about 56 years ago. He has a 7.5 pack-year smoking history. He has never used smokeless tobacco. He reports that he does not drink alcohol and does not use drugs. family history includes Breast cancer in his maternal aunt; Cancer in his maternal aunt; Colon cancer in his maternal aunt; Coronary artery disease in his father; Diabetes in his father; Esophageal cancer in his mother; Heart disease in his father, maternal aunt, maternal grandfather, maternal grandmother, maternal uncle, and mother; Hip fracture in his father; Liver cancer in his maternal uncle; Prostate cancer in his brother. Allergies  Allergen  Reactions   Amoxicillin     Per the pt "it was years ago and does not remember the reaction"   Current Outpatient Medications on File Prior to Visit  Medication Sig Dispense Refill   ASPIRIN  LOW DOSE 81 MG tablet TAKE 1 TABLET BY MOUTH EVERY DAY 90 tablet 3   escitalopram  (LEXAPRO ) 10 MG tablet TAKE 1 TABLET BY MOUTH EVERY DAY 90 tablet 3   gabapentin  (NEURONTIN ) 100 MG capsule TAKE 1 CAPSULE (100 MG TOTAL) BY MOUTH THREE TIMES DAILY. 90 capsule 5   metFORMIN  (GLUCOPHAGE -XR) 500 MG 24 hr tablet TAKE 1 TABLETS BY MOUTH EVERY DAY WITH BREAKFAST 90 tablet 3   nitroGLYCERIN  (NITROSTAT ) 0.4 MG SL tablet PLACE 1 TABLET UNDER THE TONGUE EVERY 5 MINUTES AS NEEDED FOR CHEST PAIN 25 tablet 10   rosuvastatin  (CRESTOR ) 5 MG tablet TAKE 1 TABLET (5 MG TOTAL) BY MOUTH DAILY. 90 tablet 3   traMADol  (ULTRAM ) 50 MG tablet Take 1 tablet (50 mg total) by mouth 2 (two) times daily as needed. for pain 60 tablet 1   No current facility-administered medications on file prior to visit.        ROS:  All others reviewed and negative.  Objective        PE:  BP 128/76 (BP Location: Right Arm, Patient Position: Sitting, Cuff Size: Normal)   Pulse 67   Temp 97.6 F (36.4 C) (Oral)   Ht 5\' 4"  (1.626 m)   Wt 156 lb (70.8 kg)   SpO2 99%   BMI 26.78 kg/m                 Constitutional: Pt appears in NAD               HENT: Head: NCAT.                Right Ear: External ear normal.                 Left Ear: External ear normal.                Eyes: . Pupils are equal, round, and reactive to light. Conjunctivae and EOM are normal               Nose: without d/c or deformity               Neck: Neck supple. Gross normal ROM               Cardiovascular: Normal rate and regular rhythm.                 Pulmonary/Chest: Effort normal and breath sounds without rales or wheezing.                Abd:  Soft, NT, ND, + BS, no organomegaly  Neurological: Pt is alert. At baseline orientation, motor grossly  intact               Skin: Skin is warm. No rashes, no other new lesions, LE edema - none               Psychiatric: Pt behavior is normal without agitation   Micro: none  Cardiac tracings I have personally interpreted today:  none  Pertinent Radiological findings (summarize): none   Lab Results  Component Value Date   WBC 5.8 10/26/2023   HGB 15.2 10/26/2023   HCT 45.9 10/26/2023   PLT 316.0 10/26/2023   GLUCOSE 176 (H) 10/26/2023   CHOL 137 10/26/2023   TRIG 163.0 (H) 10/26/2023   HDL 62.70 10/26/2023   LDLDIRECT 67.0 01/28/2022   LDLCALC 42 10/26/2023   ALT 34 10/26/2023   AST 24 10/26/2023   NA 139 10/26/2023   K 4.5 10/26/2023   CL 103 10/26/2023   CREATININE 1.17 10/26/2023   BUN 12 10/26/2023   CO2 30 10/26/2023   TSH 1.47 10/26/2023   PSA 0.17 10/26/2023   INR 1.00 12/22/2010   HGBA1C 7.9 (H) 10/26/2023   MICROALBUR 0.8 10/26/2023   Assessment/Plan:  [name] Gross is a 63 y.o. White or Caucasian [1] male with  has a past medical history of Allergic rhinitis, Allergy, Anxiety, Asthma, CAD (coronary artery disease), Diabetes mellitus without complication (HCC), Erectile dysfunction, GERD (gastroesophageal reflux disease), Glucose intolerance (impaired glucose tolerance), Myocardial infarction (HCC) (2011), Obesity, OSA (obstructive sleep apnea), Osteoarthritis, Prostate cancer (HCC), and Sleep apnea.  History of malignant neoplasm of prostate Lab Results  Component Value Date   PSA 0.17 10/26/2023   PSA 0.06 (L) 07/29/2022   PSA 0.07 (L) 07/28/2021   Stable, for urology f/u as planned  Encounter for well adult exam with abnormal findings Age and sex appropriate education and counseling updated with regular exercise and diet Referrals for preventative services - pt has optho appt soon Immunizations addressed - none needed Smoking counseling  - none needed Evidence for depression or other mood disorder - none significant Most recent labs reviewed. I have  personally reviewed and have noted: 1) the patient's medical and social history 2) The patient's current medications and supplements 3) The patient's height, weight, and BMI have been recorded in the chart   Dyslipidemia Lab Results  Component Value Date   LDLCALC 42 10/26/2023   Stable, pt to continue current statin crestor  5 mg qd   Vitamin D  deficiency Last vitamin D  Lab Results  Component Value Date   VD25OH 25.98 (L) 10/26/2023   Low, to start oral replacement   Diabetes mellitus (HCC) Lab Results  Component Value Date   HGBA1C 7.9 (H) 10/26/2023   Uncontrolled, has been tolerating rybelsus  14 mg, metformin  ER 500 mg - 1 every day, , pt to continue current medical treatment and f/u A1c today  Followup: Return in about 6 months (around 10/25/2024).  Rosalia Colonel, MD 04/24/2024 12:58 PM [facility] - [facility] Internal Medicine

## 2024-05-06 DIAGNOSIS — E782 Mixed hyperlipidemia: Secondary | ICD-10-CM | POA: Insufficient documentation

## 2024-05-06 DIAGNOSIS — I10 Essential (primary) hypertension: Secondary | ICD-10-CM | POA: Insufficient documentation

## 2024-05-06 NOTE — Progress Notes (Signed)
 Cardiology Office Note:    Date:  05/07/2024  ID:  Mervin Abu, DOB [birth year], MRN [id number] PCP: Roslyn Coombe, MD  [facility] Providers Cardiologist:  Arnoldo Lapping, MD       Patient Profile:      Coronary artery disease  Inferior MI in 2011 s/p BMS to RCA C/b stent thrombosis - required 2nd stent  ETT 05/2015: low risk  Diabetes mellitus  Hypertension  Hyperlipidemia  OSA Prostate CA s/p prostatectomy, XRT         Discussed the use of AI scribe software for clinical note transcription with the patient, who gave verbal consent to proceed.  History of Present Illness SONYA PUCCI is a 63 y.o. male who returns for follow up of CAD. He was last seen by Dr. Arlester Ladd in 03/2023.   He is here alone. He has been experiencing lightheadedness since last week, particularly when standing up and leaning over, such as when playing golf. He describes the sensation as 'just a tad light' and 'weird,' without any spinning sensation, chest pain, shortness of breath, or syncope. The lightheadedness occurs when he leans forward and then returns to an upright position. He has a history of neuropathy in his feet, causing balance issues due to lack of sensation. He mentions worsening symptoms and has discussed this with his primary care doctor. No recent head congestion, cold symptoms, fever, cough, vomiting, diarrhea, or changes in vision. No heart racing, trouble moving his arms, speaking difficulties, facial drooping, or leg swelling. He does not smoke.   Review of Systems  Constitutional: Negative for fever.  Respiratory:  Negative for cough.   Gastrointestinal:  Negative for diarrhea and vomiting.  -See HPI      Studies Reviewed:   EKG Interpretation Date/Time:  Monday May 07 2024 09:10:45 EDT Ventricular Rate:  76 PR Interval:  148 QRS Duration:  86 QT Interval:  386 QTC Calculation: 434 R Axis:   5  Text Interpretation: Normal sinus rhythm Inferior infarct , age  undetermined No significant change since last tracing Confirmed by Marlyse Single [phone]) on 05/07/2024 9:32:38 AM    Results LABS - Chart Review Total cholesterol: 130 mg/dL (41/32/4401) HDL: 02.7 mg/dL (25/36/6440) LDL: 48 mg/dL (34/74/2595) Triglycerides: 108 mg/dL (63/87/5643) Hemoglobin (Hb): 15.2 g/dL (32/95/1884) Creatinine (Cr): 1.19 mg/dL (16/60/6301) Potassium (K): 4.9 mmol/L (04/24/2024) Alanine aminotransferase (ALT): 20 U/L (04/24/2024)     Risk Assessment/Calculations:             Physical Exam:   VS:  BP 114/64   Pulse 74   Ht 5\' 4"  (1.626 m)   Wt 158 lb 3.2 oz (71.8 kg)   SpO2 98%   BMI 27.15 kg/m    Wt Readings from Last 3 Encounters:  05/07/24 158 lb 3.2 oz (71.8 kg)  04/24/24 156 lb (70.8 kg)  10/26/23 164 lb (74.4 kg)    Constitutional:      Appearance: Healthy appearance. Not in distress.  Neck:     Vascular: No carotid bruit. JVD normal.     Comments: No supraclavicular bruits bilat Pulmonary:     Breath sounds: Normal breath sounds. No wheezing. No rales.  Cardiovascular:     Normal rate. Regular rhythm.     Murmurs: There is no murmur.     Comments: Radial pulses 2+ bilat Edema:    Peripheral edema absent.  Abdominal:     Palpations: Abdomen is soft.        Assessment and Plan:  Assessment and Plan Assessment & Plan Coronary Artery Disease (CAD) History of inferior STEMI in 2011 treated with BMS to the RCA.  This was complicated with acute stent thrombosis requiring a second stent.  He is doing well without chest symptoms to suggest angina.    - Continue aspirin  81 mg daily - Continue rosuvastatin  5 mg daily - Renew prn nitroglycerin  prescription   Hyperlipidemia Well-controlled hyperlipidemia with lipid panel 04/24/24 done by primary care showing total cholesterol 130, HDL 60.6, LDL 48, and triglycerides 130. LDL is optimal. - Continue rosuvastatin  5 mg daily  Dizziness Etiology not clear. He notes a lightheaded sensation with  leaning forward. Symptoms do no seem to be cardiac. He has not neurologic deficits. No symptoms of syncope or near syncope. Question if symptoms related to sinus congestion vs worsening neuropathy affecting his proprioception. I have advised him to follow up with primary care if symptoms worsen. For now, he can try Flonase for a few weeks to see if this helps.          Dispo:  Return in about 1 year (around 05/07/2025) for Routine Follow Up, w/ Dr. Arlester Ladd.  Signed, Marlyse Single, PA-C

## 2024-05-07 ENCOUNTER — Encounter: Payer: Self-pay | Admitting: Physician Assistant

## 2024-05-07 ENCOUNTER — Ambulatory Visit: Payer: Self-pay | Attending: Physician Assistant | Admitting: Physician Assistant

## 2024-05-07 VITALS — BP 114/64 | HR 74 | Ht 64.0 in | Wt 158.2 lb

## 2024-05-07 DIAGNOSIS — I10 Essential (primary) hypertension: Secondary | ICD-10-CM

## 2024-05-07 DIAGNOSIS — E782 Mixed hyperlipidemia: Secondary | ICD-10-CM | POA: Diagnosis not present

## 2024-05-07 DIAGNOSIS — I2510 Atherosclerotic heart disease of native coronary artery without angina pectoris: Secondary | ICD-10-CM

## 2024-05-07 MED ORDER — NITROGLYCERIN 0.4 MG SL SUBL: 0.4000 mg | SUBLINGUAL_TABLET | SUBLINGUAL | 11 refills | Status: AC | PRN

## 2024-05-07 NOTE — Patient Instructions (Signed)
 Medication Instructions:  Your physician recommends that you continue on your current medications as directed. Please refer to the Current Medication list given to you today.  *If you need a refill on your cardiac medications before your next appointment, please call your pharmacy*  Lab Work: None ordered If you have labs (blood work) drawn today and your tests are completely normal, you will receive your results only by: MyChart Message (if you have MyChart) OR A paper copy in the mail If you have any lab test that is abnormal or we need to change your treatment, we will call you to review the results.  Testing/Procedures: None ordered  Follow-Up: At [facility], you and your health needs are our priority.  As part of our continuing mission to provide you with exceptional heart care, our providers are all part of one team.  This team includes your primary Cardiologist (physician) and Advanced Practice Providers or APPs (Physician Assistants and Nurse Practitioners) who all work together to provide you with the care you need, when you need it.  Your next appointment:   12 month(s)  Provider:   Arnoldo Lapping, MD    We recommend signing up for the patient portal called "MyChart".  Sign up information is provided on this After Visit Summary.  MyChart is used to connect with patients for Virtual Visits (Telemedicine).  Patients are able to view lab/test results, encounter notes, upcoming appointments, etc.  Non-urgent messages can be sent to your provider as well.   To learn more about what you can do with MyChart, go to [email or web address].   Other Instructions

## 2024-05-16 ENCOUNTER — Other Ambulatory Visit: Payer: Self-pay | Admitting: Cardiovascular Disease

## 2024-08-09 ENCOUNTER — Other Ambulatory Visit: Payer: Self-pay | Admitting: Internal Medicine

## 2024-10-23 ENCOUNTER — Ambulatory Visit: Admitting: Internal Medicine

## 2024-10-23 ENCOUNTER — Encounter: Payer: Self-pay | Admitting: Internal Medicine

## 2024-10-23 VITALS — BP 124/78 | HR 70 | Temp 98.2°F | Ht 64.0 in | Wt 160.0 lb

## 2024-10-23 DIAGNOSIS — E1165 Type 2 diabetes mellitus with hyperglycemia: Secondary | ICD-10-CM

## 2024-10-23 DIAGNOSIS — E785 Hyperlipidemia, unspecified: Secondary | ICD-10-CM | POA: Insufficient documentation

## 2024-10-23 DIAGNOSIS — E559 Vitamin D deficiency, unspecified: Secondary | ICD-10-CM | POA: Insufficient documentation

## 2024-10-23 DIAGNOSIS — Z23 Encounter for immunization: Secondary | ICD-10-CM | POA: Diagnosis not present

## 2024-10-23 DIAGNOSIS — E1140 Type 2 diabetes mellitus with diabetic neuropathy, unspecified: Secondary | ICD-10-CM | POA: Diagnosis not present

## 2024-10-23 DIAGNOSIS — I2510 Atherosclerotic heart disease of native coronary artery without angina pectoris: Secondary | ICD-10-CM | POA: Diagnosis not present

## 2024-10-23 DIAGNOSIS — E782 Mixed hyperlipidemia: Secondary | ICD-10-CM

## 2024-10-23 DIAGNOSIS — Z7984 Long term (current) use of oral hypoglycemic drugs: Secondary | ICD-10-CM

## 2024-10-23 LAB — HEPATIC FUNCTION PANEL
ALT: 30 U/L (ref 0–53)
AST: 24 U/L (ref 0–37)
Albumin: 4.4 g/dL (ref 3.5–5.2)
Alkaline Phosphatase: 88 U/L (ref 39–117)
Bilirubin, Direct: 0.1 mg/dL (ref 0.0–0.3)
Total Bilirubin: 0.6 mg/dL (ref 0.2–1.2)
Total Protein: 6.6 g/dL (ref 6.0–8.3)

## 2024-10-23 LAB — BASIC METABOLIC PANEL WITH GFR
BUN: 15 mg/dL (ref 6–23)
CO2: 29 meq/L (ref 19–32)
Calcium: 9.3 mg/dL (ref 8.4–10.5)
Chloride: 103 meq/L (ref 96–112)
Creatinine, Ser: 1.18 mg/dL (ref 0.40–1.50)
GFR: 65.62 mL/min (ref >60.00)
Glucose, Bld: 179 mg/dL — ABNORMAL HIGH (ref 70–99)
Potassium: 4.3 meq/L (ref 3.5–5.1)
Sodium: 139 meq/L (ref 135–145)

## 2024-10-23 LAB — LIPID PANEL
Cholesterol: 151 mg/dL (ref 0–200)
HDL: 60.6 mg/dL (ref >39.00)
LDL Cholesterol: 64 mg/dL (ref 0–99)
NonHDL: 90.06
Total CHOL/HDL Ratio: 2
Triglycerides: 128 mg/dL (ref 0.0–149.0)
VLDL: 25.6 mg/dL (ref 0.0–40.0)

## 2024-10-23 LAB — HEMOGLOBIN A1C: Hgb A1c MFr Bld: 7.5 % — ABNORMAL HIGH (ref 4.6–6.5)

## 2024-10-23 NOTE — Progress Notes (Signed)
 Patient ID: [name] Gross, male   DOB: 1961/03/04, 63 y.o.   MRN: [id number]        Chief Complaint: follow up hld, dm, prostate ca       HPI:  [name] Gross is a 63 y.o. male here overall doing ok, Pt denies chest pain, increased sob or doe, wheezing, orthopnea, PND, increased LE swelling, palpitations, dizziness or syncope.   Pt denies polydipsia, polyuria, or new focal neuro s/s.    Pt denies fever, wt loss, night sweats, loss of appetite, or other constitutional symptoms  Saw urology Dr Watt x 3 wks with slightly higher psa, and contd monitoring for now, s/p prostatecomy 2008, then xRT 2019 38 tx.   Due for flu shot.  Wt Readings from Last 3 Encounters:  10/23/24 160 lb (72.6 kg)  05/07/24 158 lb 3.2 oz (71.8 kg)  04/24/24 156 lb (70.8 kg)   BP Readings from Last 3 Encounters:  10/23/24 124/78  05/07/24 114/64  04/24/24 128/76         Past Medical History:  Diagnosis Date   Allergic rhinitis    Allergy    Anxiety    Asthma    mild intermittent   CAD (coronary artery disease)    s/p INF STEMI 12/04/2010: treated with BMS to RCA c/b acute stent thrombosis at completion of  primary PCI ...above tx with second BMS with adjunctive thrombectomy residual CAD at cath 12/04/2010: pRCA 40-50%; LAD and CFX ok; EF 45% Echo 12/06/2010: Ef 55-65%; ?HK inferior wall; mild MR   Diabetes mellitus without complication (HCC)    Erectile dysfunction    GERD (gastroesophageal reflux disease)    Glucose intolerance (impaired glucose tolerance)    Myocardial infarction (HCC) 2011   2 within an hour   Obesity    OSA (obstructive sleep apnea)    mild, no CPAP per pt preference   Osteoarthritis    Prostate cancer [facility])    Sleep apnea    Past Surgical History:  Procedure Laterality Date   COLONOSCOPY  2013   CORONARY STENT PLACEMENT  2011   Stenting of the right coronary artery......... Successful percutaneous coronary intervention with  adjunctive thrombectomy for treatment of acute stent  thrombosis and   reocclusion of the right coronary artery after treatment earlier today  of an acute myocardial infarction   ELBOW SURGERY     KNEE SURGERY     NASAL SINUS SURGERY     SPERMATOCELECTOMY     WRIST SURGERY      reports that he quit smoking about 42 years ago. His smoking use included cigarettes. He started smoking about 57 years ago. He has a 7.5 pack-year smoking history. He has never used smokeless tobacco. He reports that he does not drink alcohol and does not use drugs. family history includes Breast cancer in his maternal aunt; Cancer in his maternal aunt; Colon cancer in his maternal aunt; Coronary artery disease in his father; Diabetes in his father; Esophageal cancer in his mother; Heart disease in his father, maternal aunt, maternal grandfather, maternal grandmother, maternal uncle, and mother; Hip fracture in his father; Liver cancer in his maternal uncle; Prostate cancer in his brother. Allergies  Allergen Reactions   Amoxicillin     Per the pt it was years ago and does not remember the reaction   Current Outpatient Medications on File Prior to Visit  Medication Sig Dispense Refill   ASPIRIN  LOW DOSE 81 MG tablet TAKE 1 TABLET BY MOUTH  EVERY DAY 120 tablet 2   escitalopram  (LEXAPRO ) 10 MG tablet TAKE 1 TABLET BY MOUTH EVERY DAY 90 tablet 3   gabapentin  (NEURONTIN ) 100 MG capsule TAKE 1 CAPSULE (100 MG TOTAL) BY MOUTH THREE TIMES DAILY. 90 capsule 5   metFORMIN  (GLUCOPHAGE -XR) 500 MG 24 hr tablet TAKE 1 TABLETS BY MOUTH EVERY DAY WITH BREAKFAST 90 tablet 3   nitroGLYCERIN  (NITROSTAT ) 0.4 MG SL tablet Place 1 tablet (0.4 mg total) under the tongue every 5 (five) minutes as needed for chest pain. 25 tablet 11   rosuvastatin  (CRESTOR ) 5 MG tablet TAKE 1 TABLET (5 MG TOTAL) BY MOUTH DAILY. 90 tablet 3   Semaglutide  (RYBELSUS ) 14 MG TABS Take 1 tablet (14 mg total) by mouth daily. 90 tablet 3   traMADol  (ULTRAM ) 50 MG tablet TAKE 1 TABLET (50 MG TOTAL) BY MOUTH 2 (TWO)  TIMES DAILY AS NEEDED. FOR PAIN 60 tablet 2   No current facility-administered medications on file prior to visit.        ROS:  All others reviewed and negative.  Objective        PE:  BP 124/78 (BP Location: Right Arm, Patient Position: Sitting, Cuff Size: Normal)   Pulse 70   Temp 98.2 F (36.8 C) (Oral)   Ht 5' 4 (1.626 m)   Wt 160 lb (72.6 kg)   SpO2 98%   BMI 27.46 kg/m                 Constitutional: Pt appears in NAD               HENT: Head: NCAT.                Right Ear: External ear normal.                 Left Ear: External ear normal.                Eyes: . Pupils are equal, round, and reactive to light. Conjunctivae and EOM are normal               Nose: without d/c or deformity               Neck: Neck supple. Gross normal ROM               Cardiovascular: Normal rate and regular rhythm.                 Pulmonary/Chest: Effort normal and breath sounds without rales or wheezing.                Abd:  Soft, NT, ND, + BS, no organomegaly               Neurological: Pt is alert. At baseline orientation, motor grossly intact               Skin: Skin is warm. No rashes, no other new lesions, LE edema - none               Psychiatric: Pt behavior is normal without agitation   Micro: none  Cardiac tracings I have personally interpreted today:  none  Pertinent Radiological findings (summarize): none   Lab Results  Component Value Date   WBC 5.8 04/24/2024   HGB 15.2 04/24/2024   HCT 45.2 04/24/2024   PLT 344.0 04/24/2024   GLUCOSE 150 (H) 04/24/2024   CHOL 130 04/24/2024   TRIG 108.0 04/24/2024   HDL 60.60  04/24/2024   LDLDIRECT 67.0 01/28/2022   LDLCALC 48 04/24/2024   ALT 20 04/24/2024   AST 19 04/24/2024   NA 139 04/24/2024   K 4.9 04/24/2024   CL 100 04/24/2024   CREATININE 1.19 04/24/2024   BUN 12 04/24/2024   CO2 32 04/24/2024   TSH 2.39 04/24/2024   PSA 0.18 04/24/2024   INR 1.00 12/22/2010   HGBA1C 6.8 (H) 04/24/2024   MICROALBUR 0.8  04/24/2024   Assessment/Plan:  [name] Gross is a 63 y.o. White or Caucasian [1] male with  has a past medical history of Allergic rhinitis, Allergy, Anxiety, Asthma, CAD (coronary artery disease), Diabetes mellitus without complication (HCC), Erectile dysfunction, GERD (gastroesophageal reflux disease), Glucose intolerance (impaired glucose tolerance), Myocardial infarction (HCC) (2011), Obesity, OSA (obstructive sleep apnea), Osteoarthritis, Prostate cancer (HCC), and Sleep apnea.  HLD (hyperlipidemia) Lab Results  Component Value Date   LDLCALC 48 04/24/2024   Stable, pt to continue current statin crestor  5 mg qd   Diabetes mellitus with diabetic neuropathy (HCC) With neuropathy Lab Results  Component Value Date   HGBA1C 6.8 (H) 04/24/2024   Stable, pt to continue current medical treatment metformin  ER 500 mg - 1 every day, rybelsus  14 mg qd   CAD (coronary artery disease) Symptomatically stable, cont current med tx  Vitamin D  deficiency Last vitamin D  Lab Results  Component Value Date   VD25OH 35.75 04/24/2024   Low, to start oral replacement   Followup: Return in about 6 months (around 04/22/2025).  Lynwood Rush, MD 10/23/2024 9:10 PM [facility] - [facility] Internal Medicine

## 2024-10-23 NOTE — Assessment & Plan Note (Signed)
 Lab Results  Component Value Date   LDLCALC 48 04/24/2024   Stable, pt to continue current statin crestor  5 mg qd

## 2024-10-23 NOTE — Assessment & Plan Note (Signed)
 Symptomatically stable, cont current med tx

## 2024-10-23 NOTE — Patient Instructions (Signed)
 You had the flu shot today  Please continue all other medications as before, and refills have been done if requested.  Please have the pharmacy call with any other refills you may need.  Please continue your efforts at being more active, low cholesterol diet, and weight control.  Please keep your appointments with your specialists as you may have planned - urology as planned  Please go to the LAB at the blood drawing area for the tests to be done  You will be contacted by phone if any changes need to be made immediately.  Otherwise, you will receive a letter about your results with an explanation, but please check with MyChart first.  Please make an Appointment to return in 6 months, or sooner if needed

## 2024-10-23 NOTE — Assessment & Plan Note (Signed)
 Last vitamin D  Lab Results  Component Value Date   VD25OH 35.75 04/24/2024   Low, to start oral replacement

## 2024-10-23 NOTE — Assessment & Plan Note (Signed)
 With neuropathy Lab Results  Component Value Date   HGBA1C 6.8 (H) 04/24/2024   Stable, pt to continue current medical treatment metformin  ER 500 mg - 1 every day, rybelsus  14 mg qd

## 2024-10-24 ENCOUNTER — Other Ambulatory Visit: Payer: Self-pay | Admitting: Internal Medicine

## 2024-10-24 ENCOUNTER — Ambulatory Visit: Payer: Self-pay | Admitting: Internal Medicine

## 2024-10-24 LAB — VITAMIN D 25 HYDROXY (VIT D DEFICIENCY, FRACTURES): VITD: 31.18 ng/mL (ref 30.00–100.00)

## 2024-10-24 MED ORDER — DAPAGLIFLOZIN PROPANEDIOL 10 MG PO TABS: 10.0000 mg | ORAL_TABLET | Freq: Every day | ORAL | 3 refills | Status: AC

## 2024-11-15 ENCOUNTER — Other Ambulatory Visit: Payer: Self-pay | Admitting: Internal Medicine

## 2025-01-01 ENCOUNTER — Other Ambulatory Visit: Payer: Self-pay | Admitting: Internal Medicine

## 2025-01-04 ENCOUNTER — Other Ambulatory Visit (HOSPITAL_COMMUNITY): Payer: Self-pay

## 2025-04-22 ENCOUNTER — Ambulatory Visit: Admitting: Internal Medicine

## 2025-05-08 ENCOUNTER — Ambulatory Visit: Admitting: Physician Assistant
